# Patient Record
Sex: Male | Born: 1955
Health system: Southern US, Community
[De-identification: ages and names within clinical notes are randomized; demographics above are authoritative.]

## PROBLEM LIST (undated history)

## (undated) ENCOUNTER — Ambulatory Visit (HOSPITAL_COMMUNITY): Admission: EM | Payer: Medicare PPO | Source: Home / Self Care

## (undated) DIAGNOSIS — L0291 Cutaneous abscess, unspecified: Secondary | ICD-10-CM

## (undated) DIAGNOSIS — E119 Type 2 diabetes mellitus without complications: Secondary | ICD-10-CM

## (undated) DIAGNOSIS — I10 Essential (primary) hypertension: Secondary | ICD-10-CM

## (undated) DIAGNOSIS — Z973 Presence of spectacles and contact lenses: Secondary | ICD-10-CM

## (undated) DIAGNOSIS — M1990 Unspecified osteoarthritis, unspecified site: Secondary | ICD-10-CM

## (undated) HISTORY — PX: JOINT REPLACEMENT: SHX530

## (undated) HISTORY — PX: CYST REMOVAL NECK: SHX6281

## (undated) HISTORY — PX: COLONOSCOPY: SHX174

## (undated) HISTORY — PX: REPLACEMENT TOTAL KNEE: SUR1224

## (undated) HISTORY — PX: INGUINAL HERNIA REPAIR: SUR1180

## (undated) HISTORY — PX: PILONIDAL CYST EXCISION: SHX744

---

## 2001-02-07 ENCOUNTER — Emergency Department (HOSPITAL_COMMUNITY): Admission: EM | Admit: 2001-02-07 | Discharge: 2001-02-07 | Payer: Self-pay | Admitting: Emergency Medicine

## 2001-02-07 ENCOUNTER — Encounter: Payer: Self-pay | Admitting: Emergency Medicine

## 2001-02-13 ENCOUNTER — Ambulatory Visit (HOSPITAL_COMMUNITY): Admission: RE | Admit: 2001-02-13 | Discharge: 2001-02-13 | Payer: Self-pay | Admitting: Sports Medicine

## 2001-02-13 ENCOUNTER — Encounter: Payer: Self-pay | Admitting: Sports Medicine

## 2013-12-13 ENCOUNTER — Emergency Department (HOSPITAL_COMMUNITY)
Admission: EM | Admit: 2013-12-13 | Discharge: 2013-12-13 | Disposition: A | Discharge status: Home / Self Care | Payer: BC Managed Care – PPO | Attending: Emergency Medicine | Admitting: Emergency Medicine

## 2013-12-13 ENCOUNTER — Emergency Department (HOSPITAL_COMMUNITY): Payer: BC Managed Care – PPO

## 2013-12-13 ENCOUNTER — Encounter (HOSPITAL_COMMUNITY): Payer: Self-pay | Admitting: Emergency Medicine

## 2013-12-13 DIAGNOSIS — Y9389 Activity, other specified: Secondary | ICD-10-CM | POA: Insufficient documentation

## 2013-12-13 DIAGNOSIS — IMO0002 Reserved for concepts with insufficient information to code with codable children: Secondary | ICD-10-CM | POA: Insufficient documentation

## 2013-12-13 DIAGNOSIS — E119 Type 2 diabetes mellitus without complications: Secondary | ICD-10-CM | POA: Insufficient documentation

## 2013-12-13 DIAGNOSIS — S20219A Contusion of unspecified front wall of thorax, initial encounter: Secondary | ICD-10-CM | POA: Insufficient documentation

## 2013-12-13 DIAGNOSIS — R110 Nausea: Secondary | ICD-10-CM | POA: Insufficient documentation

## 2013-12-13 DIAGNOSIS — Z79899 Other long term (current) drug therapy: Secondary | ICD-10-CM | POA: Insufficient documentation

## 2013-12-13 DIAGNOSIS — M129 Arthropathy, unspecified: Secondary | ICD-10-CM | POA: Insufficient documentation

## 2013-12-13 DIAGNOSIS — S199XXA Unspecified injury of neck, initial encounter: Secondary | ICD-10-CM

## 2013-12-13 DIAGNOSIS — S0993XA Unspecified injury of face, initial encounter: Secondary | ICD-10-CM | POA: Insufficient documentation

## 2013-12-13 DIAGNOSIS — Y92410 Unspecified street and highway as the place of occurrence of the external cause: Secondary | ICD-10-CM | POA: Insufficient documentation

## 2013-12-13 HISTORY — DX: Type 2 diabetes mellitus without complications: E11.9

## 2013-12-13 HISTORY — DX: Unspecified osteoarthritis, unspecified site: M19.90

## 2013-12-13 LAB — POCT I-STAT, CHEM 8
BUN: 14 mg/dL (ref 6–23)
CREATININE: 0.7 mg/dL (ref 0.50–1.35)
Calcium, Ion: 1.2 mmol/L (ref 1.12–1.23)
Chloride: 99 mEq/L (ref 96–112)
GLUCOSE: 144 mg/dL — AB (ref 70–99)
HCT: 45 % (ref 39.0–52.0)
HEMOGLOBIN: 15.3 g/dL (ref 13.0–17.0)
Potassium: 3.9 mEq/L (ref 3.7–5.3)
Sodium: 138 mEq/L (ref 137–147)
TCO2: 26 mmol/L (ref 0–100)

## 2013-12-13 LAB — GLUCOSE, CAPILLARY: GLUCOSE-CAPILLARY: 132 mg/dL — AB (ref 70–99)

## 2013-12-13 LAB — POCT I-STAT TROPONIN I: Troponin i, poc: 0 ng/mL (ref 0.00–0.08)

## 2013-12-13 MED ORDER — METHOCARBAMOL 750 MG PO TABS: ORAL_TABLET | ORAL | Status: DC

## 2013-12-13 MED ORDER — TRAMADOL HCL 50 MG PO TABS: ORAL_TABLET | ORAL | Status: DC

## 2013-12-13 MED ORDER — KETOROLAC TROMETHAMINE 60 MG/2ML IM SOLN
60.0000 mg | Freq: Once | INTRAMUSCULAR | Status: AC
  Administered 2013-12-13: 60 mg via INTRAMUSCULAR
  Filled 2013-12-13: qty 2

## 2013-12-13 MED ORDER — CYCLOBENZAPRINE HCL 10 MG PO TABS
10.0000 mg | ORAL_TABLET | Freq: Once | ORAL | Status: AC
  Administered 2013-12-13: 10 mg via ORAL
  Filled 2013-12-13: qty 1

## 2013-12-13 MED ORDER — NAPROXEN 500 MG PO TABS: 500.0000 mg | ORAL_TABLET | Freq: Two times a day (BID) | ORAL | Status: DC

## 2013-12-13 NOTE — ED Notes (Signed)
Marchelle Folksmanda R unsuccessful attempt to draw labs. Fleet ContrasRachel C at bedside attempting lab draw at present time.

## 2013-12-13 NOTE — ED Notes (Signed)
Pt talking to coworker at present time. During blood draw pt sleeping and "not responding to staff" per Fleet Contrasachel C. Pt a/o x4 at present time. Pt reports "tired working overtime; was at work at 3 am this morning." EDP Knapp aware.
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
## 2013-12-13 NOTE — Progress Notes (Signed)
[facility] CL provided pt with a list of primary care resources, [facility] information, and [facility] [employer]. Patient was sleeping, CL left paperwork with pt belongings.
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
## 2013-12-13 NOTE — ED Notes (Signed)
Attempt to draw blood unsuccessful by this RN. Mini lab staff verbalizes will attempt lab draw.

## 2014-02-23 ENCOUNTER — Emergency Department (HOSPITAL_COMMUNITY): Payer: BC Managed Care – PPO

## 2014-02-23 ENCOUNTER — Encounter (HOSPITAL_COMMUNITY): Payer: Self-pay | Admitting: Emergency Medicine

## 2014-02-23 ENCOUNTER — Emergency Department (HOSPITAL_COMMUNITY)
Admission: EM | Admit: 2014-02-23 | Discharge: 2014-02-23 | Disposition: A | Discharge status: Home / Self Care | Payer: BC Managed Care – PPO | Attending: Emergency Medicine | Admitting: Emergency Medicine

## 2014-02-23 DIAGNOSIS — Y92410 Unspecified street and highway as the place of occurrence of the external cause: Secondary | ICD-10-CM | POA: Insufficient documentation

## 2014-02-23 DIAGNOSIS — E119 Type 2 diabetes mellitus without complications: Secondary | ICD-10-CM | POA: Insufficient documentation

## 2014-02-23 DIAGNOSIS — S46909A Unspecified injury of unspecified muscle, fascia and tendon at shoulder and upper arm level, unspecified arm, initial encounter: Secondary | ICD-10-CM | POA: Insufficient documentation

## 2014-02-23 DIAGNOSIS — Z791 Long term (current) use of non-steroidal anti-inflammatories (NSAID): Secondary | ICD-10-CM | POA: Insufficient documentation

## 2014-02-23 DIAGNOSIS — M129 Arthropathy, unspecified: Secondary | ICD-10-CM | POA: Insufficient documentation

## 2014-02-23 DIAGNOSIS — Z79899 Other long term (current) drug therapy: Secondary | ICD-10-CM | POA: Insufficient documentation

## 2014-02-23 DIAGNOSIS — Y9389 Activity, other specified: Secondary | ICD-10-CM | POA: Insufficient documentation

## 2014-02-23 DIAGNOSIS — S4992XA Unspecified injury of left shoulder and upper arm, initial encounter: Secondary | ICD-10-CM

## 2014-02-23 DIAGNOSIS — S4980XA Other specified injuries of shoulder and upper arm, unspecified arm, initial encounter: Secondary | ICD-10-CM | POA: Insufficient documentation

## 2014-02-23 MED ORDER — HYDROCODONE-ACETAMINOPHEN 5-325 MG PO TABS: 1.0000 | ORAL_TABLET | ORAL | Status: DC | PRN
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
## 2014-02-23 NOTE — ED Provider Notes (Signed)
Medical screening examination/treatment/procedure(s) were performed by non-physician practitioner and as supervising physician I was immediately available for consultation/collaboration.   EKG Interpretation None        William Daeveon Zweber, MD 02/23/14 1514 
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
## 2014-12-16 ENCOUNTER — Emergency Department (HOSPITAL_COMMUNITY)
Admission: EM | Admit: 2014-12-16 | Discharge: 2014-12-16 | Disposition: A | Discharge status: Home / Self Care | Payer: BLUE CROSS/BLUE SHIELD | Attending: Emergency Medicine | Admitting: Emergency Medicine

## 2014-12-16 ENCOUNTER — Emergency Department (HOSPITAL_COMMUNITY): Payer: BLUE CROSS/BLUE SHIELD

## 2014-12-16 ENCOUNTER — Encounter (HOSPITAL_COMMUNITY): Payer: Self-pay | Admitting: Emergency Medicine

## 2014-12-16 DIAGNOSIS — Y9289 Other specified places as the place of occurrence of the external cause: Secondary | ICD-10-CM | POA: Diagnosis not present

## 2014-12-16 DIAGNOSIS — Y9301 Activity, walking, marching and hiking: Secondary | ICD-10-CM | POA: Insufficient documentation

## 2014-12-16 DIAGNOSIS — S76101A Unspecified injury of right quadriceps muscle, fascia and tendon, initial encounter: Secondary | ICD-10-CM | POA: Diagnosis not present

## 2014-12-16 DIAGNOSIS — Y998 Other external cause status: Secondary | ICD-10-CM | POA: Insufficient documentation

## 2014-12-16 DIAGNOSIS — M1990 Unspecified osteoarthritis, unspecified site: Secondary | ICD-10-CM | POA: Diagnosis not present

## 2014-12-16 DIAGNOSIS — Z791 Long term (current) use of non-steroidal anti-inflammatories (NSAID): Secondary | ICD-10-CM | POA: Diagnosis not present

## 2014-12-16 DIAGNOSIS — S8991XA Unspecified injury of right lower leg, initial encounter: Secondary | ICD-10-CM

## 2014-12-16 DIAGNOSIS — S76111A Strain of right quadriceps muscle, fascia and tendon, initial encounter: Secondary | ICD-10-CM

## 2014-12-16 DIAGNOSIS — W108XXA Fall (on) (from) other stairs and steps, initial encounter: Secondary | ICD-10-CM | POA: Insufficient documentation

## 2014-12-16 DIAGNOSIS — E119 Type 2 diabetes mellitus without complications: Secondary | ICD-10-CM | POA: Insufficient documentation

## 2014-12-16 MED ORDER — OXYCODONE-ACETAMINOPHEN 5-325 MG PO TABS
2.0000 | ORAL_TABLET | Freq: Once | ORAL | Status: AC
  Administered 2014-12-16: 2 via ORAL
  Filled 2014-12-16: qty 2

## 2014-12-16 MED ORDER — NAPROXEN 500 MG PO TABS: 500.0000 mg | ORAL_TABLET | Freq: Two times a day (BID) | ORAL | Status: DC

## 2014-12-16 MED ORDER — ONDANSETRON HCL 4 MG/2ML IJ SOLN: 4.0000 mg | Freq: Once | INTRAMUSCULAR | Status: DC

## 2014-12-16 MED ORDER — ONDANSETRON 4 MG PO TBDP
4.0000 mg | ORAL_TABLET | Freq: Once | ORAL | Status: AC
  Administered 2014-12-16: 4 mg via ORAL
  Filled 2014-12-16: qty 1

## 2014-12-16 MED ORDER — OXYCODONE-ACETAMINOPHEN 5-325 MG PO TABS: 1.0000 | ORAL_TABLET | ORAL | Status: DC | PRN

## 2014-12-16 NOTE — ED Notes (Signed)
Patient transported to MRI 

## 2014-12-16 NOTE — Discharge Instructions (Signed)
Tendon Repair You have an injured tendon. This is a cord like structure that connects muscles to bones. Muscles and tendons work together to move your arms, legs, fingers, and toes. Your doctor may repair your tendon by sewing it back together. Following this repair the tendon needs to be immobilized (made to not move) and not used to allow it to heal. Sometimes the condition of the wound and the type of damage done may make it necessary to simply clean and repair the wound and then go back in and repair the tendon in about one week to ten days to obtain a better result. Sometimes x-rays are required to make sure there is no additional boney injury. With complete rupture (break) of a tendon, surgical repair with casting is necessary. Surgery allows the surgeon to put the tendon back together. The cast is used to allow the repair time to heal. The injury may be put in a caste or immobilized for 6 to 10 weeks. Immobilization means that the tendon injured is kept in position with a cast or splint. Once your caregiver feels you have healed well enough following this injury, they will provide exercises you can do to rehabilitate (make better) the injured tendon. HOME CARE INSTRUCTIONS   Do not use the injured tendon for as long as directed by your caregiver.  Rest the tendon as directed. Do not use it for lifting, walking, etc.  Leave the splint or dressing in place for as long as directed by your caregiver. Return for your first dressing change as directed.  Keep the dressing clean and dry.  Keep the injured area raised above the level of your heart as much as possible for the first week. SEEK MEDICAL CARE IF:   You have increased bleeding (more than a small spot) from the wound or from beneath your cast or splint.  You have redness, swelling, or increasing pain in the wound or from beneath your cast or splint.  You have pus coming from the wound or from beneath the cast or splint.  You notice a  bad smell coming from the wound or dressing or from beneath your cast or splint.  You develop increasing pain and swelling, not controlled with medicine. SEEK IMMEDIATE MEDICAL CARE IF:   You have a fever.  You develop a rash.  You have difficulty breathing.  You have any allergic problems. Document Released: 04/19/2001 Document Revised: 01/16/2012 Document Reviewed: 09/10/2007 [facility] Patient Information 2015 [employer] [place]. This information is not intended to replace advice given to you by your health care provider. Make sure you discuss any questions you have with your health care provider. Quadriceps Tendon Tear/Disruption with Rehab The quadriceps muscles are located on the front of the thigh and are responsible for straightening the knee and bending the hip. The quadriceps tendon connects these muscles to the kneecap (patella) and also from the patella to a portion of the shin bone (tibial tubercle). A quadriceps tendon tear or disruption is characterized by a partial or complete tear of the quadriceps tendon between the quadriceps muscles and the patella. Quadriceps tendon tears or disruptions often cause pain above the knee and result in a decrease in function of the quadriceps muscles.  SYMPTOMS   A "pop" or tear felt or heard above the patella at the time of injury.  Pain, tenderness, inflammation, and/or bruising over the quadriceps tendon.  Pain that worsens with use of the quadriceps muscles.  Difficulty with common tasks that involve the quadriceps  muscle, such as walking.  A crackling sound (crepitation) when the tendon is moved or touched.  Loss of fullness of the muscle or bulging within the area of muscle with complete rupture. CAUSES  A strain occurs when a force is placed on the muscle or tendon that is greater than it can withstand. Common mechanisms of injury include:  Repetitive strenuous use of the quadriceps muscles. This may be due to an increase in the  intensity, frequency, or duration of exercise.  Direct trauma to the quadriceps muscles or tendons. RISK INCREASES WITH:  Activities that involve forceful contractions of the quadriceps muscles (jumping or sprinting).  Contact sports (soccer or football).  Poor strength and flexibility.  Failure to warm-up properly before activity.  Previous injury to the thigh or knee.  Untreated quadriceps tendinitis.  Corticosteroid injections into the quadriceps tendon. PREVENTION  Warm up and stretch properly before activity.  Allow for adequate recovery between workouts.  Maintain physical fitness:  Strength, flexibility, and endurance.  Cardiovascular fitness.  Wear properly fitted and padded protective equipment. PROGNOSIS  If treated properly, then recovery from a quadriceps tendon tear or disruption usually occurs; however the recovery period may b 6 to 9 months.  RELATED COMPLICATIONS   Quadriceps muscle weakness.  Re-rupture of the tendon after treatment.  Prolonged healing time, if improperly treated or re-injured.  Risks of surgery: infection, bleeding, nerve damage, or damage to surrounding tissues. TREATMENT  Initial treatment involves rest from any activities that aggravate the symptoms. Ice, medication, and elevation may be used to help reduce pain and inflammation. The use of strengthening and stretching exercises may help reduce pain with activity. These exercises may be performed at home or with referral to a therapist. If the tear is complete, then surgery is usually required to repair the tendon, as it cannot heal on its own. After surgery immobilization is required to allow for healing. After immobilization it is important to perform strengthening and stretching exercises to help regain strength and a full range of motion.  MEDICATION   If pain medication is necessary, then nonsteroidal anti-inflammatory medications, such as aspirin and ibuprofen, or other minor  pain relievers, such as acetaminophen, are often recommended.  Do not take pain medication for 7 days before surgery.  Prescription pain relievers may be given if deemed necessary by your caregiver. Use only as directed and only as much as you need. COLD THERAPY  Cold treatment (icing) relieves pain and reduces inflammation. Cold treatment should be applied for 10 to 15 minutes every 2 to 3 hours for inflammation and pain and immediately after any activity that aggravates your symptoms. Use ice packs or massage the area with a piece of ice (ice massage). SEEK MEDICAL CARE IF:  Treatment seems to offer no benefit, or the condition worsens.  Any medications produce adverse side effects.  Any complications from surgery occur:  Pain, numbness, or coldness in the extremity operated upon.  Discoloration of the nail beds (they become blue or gray) of the extremity operated upon.  Signs of infections (fever, pain, inflammation, redness, or persistent bleeding). EXERCISES RANGE OF MOTION (ROM) AND STRETCHING EXERCISES - Quadriceps Tendon Tear/Disruption These exercises may help you when beginning to rehabilitate your injury. Your symptoms may resolve with or without further involvement from your physician, physical therapist or athletic trainer. While completing these exercises, remember:   Restoring tissue flexibility helps normal motion to return to the joints. This allows healthier, less painful movement and activity.  An effective  stretch should be held for at least 30 seconds.  A stretch should never be painful. You should only feel a gentle lengthening or release in the stretched tissue. RANGE OF MOTION - Knee Flexion and Extension, Active-Assisted  Sit on the edge of a table or chair with your thighs firmly supported. It may be helpful to place a folded towel under the end of your right / left thigh.  Flexion (bending): Place the ankle of your healthy leg on top of the other ankle. Use  your healthy leg to gently bend your right / left knee until you feel a mild tension across the top of your knee.  Hold for __________ seconds.  Extension (straightening): Switch your ankles so your right / left leg is on top. Use your healthy leg to straighten your right / left knee until you feel a mild tension on the backside of your knee.  Hold for __________ seconds. Repeat __________ times. Complete __________ times per day. RANGE OF MOTION - Knee Flexion, Active  Lie on your back with both knees straight. (If this causes back discomfort, bend your opposite knee, placing your foot flat on the floor.)  Slowly slide your heel back toward your buttocks until you feel a gentle stretch in the front of your knee or thigh.  Hold for __________ seconds. Slowly slide your heel back to the starting position. Repeat __________ times. Complete this exercise __________ times per day.  STRETCH - Knee Flexion, Supine  Lie on the floor with your right / left heel/foot lightly touching the wall (place both feet on the wall if you do not use a door frame).  Without using any effort, allow gravity to slide your foot down the wall slowly until you feel a gentle stretch in the front of your right / left knee.  Hold this stretch for __________ seconds. Then return the leg to the starting position, using your health leg for help, if needed. Repeat __________ times. Complete this stretch __________ times per day.  STRETCH - Quadriceps, Prone   Lie on your stomach on a firm surface, such as a bed or padded floor.  Bend your right / left knee and grasp your ankle. If you are unable to reach, your ankle or pant leg, use a belt around your foot to lengthen your reach.  Gently pull your heel toward your buttocks. Your knee should not slide out to the side. You should feel a stretch in the front of your thigh and/or knee. Hold this position for __________ seconds.  Repeat __________ times. Complete this  stretch __________ times per day.  STRENGTHENING EXERCISES - Quadriceps Tendon Tear/Disruption These exercises may help you when beginning to rehabilitate your injury. They may resolve your symptoms with or without further involvement from your physician, physical therapist or athletic trainer. While completing these exercises, remember:   Muscles can gain both the endurance and the strength needed for everyday activities through controlled exercises.  Complete these exercises as instructed by your physician, physical therapist or athletic trainer. Progress the resistance and repetitions only as guided. STRENGTH - Quadriceps, Isometrics  Lie on your back with your right / left leg extended and your opposite knee bent.  Gradually tense the muscles in the front of your right / left thigh. You should see either your knee cap slide up toward your hip or increased dimpling just above the knee. This motion will push the back of the knee down toward the floor/mat/bed on which you are lying.  Hold the muscle as tight as you can without increasing your pain for __________ seconds.  Relax the muscles slowly and completely in between each repetition. Repeat __________ times. Complete this exercise __________ times per day.  STRENGTH - Quadriceps, Short Arcs   Lie on your back. Place a __________ inch towel roll under your knee so that the knee slightly bends.  Raise only your lower leg by tightening the muscles in the front of your thigh. Do not allow your thigh to rise.  Hold this position for __________ seconds. Repeat __________ times. Complete this exercise __________ times per day.  OPTIONAL ANKLE WEIGHTS: Begin with ____________________, but DO NOT exceed ____________________. Increase in1 lb/0.5 kg increments.  STRENGTH - Quadriceps, Straight Leg Raises  Quality counts! Watch for signs that the quadriceps muscle is working to insure you are strengthening the correct muscles and not "cheating"  by substituting with healthier muscles.  Lay on your back with your right / left leg extended and your opposite knee bent.  Tense the muscles in the front of your right / left thigh. You should see either your knee cap slide up or increased dimpling just above the knee. Your thigh may even quiver.  Tighten these muscles even more and raise your leg 4 to 6 inches off the floor. Hold for __________ seconds.  Keeping these muscles tense, lower your leg.  Relax the muscles slowly and completely in between each repetition. Repeat __________ times. Complete this exercise __________ times per day.  STRENGTH - Quadriceps, Step-Ups   Use a thick book, step or step stool that is __________ inches tall.  Holding a wall or counter for balance only, not support.  Slowly step-up with your right / left foot, keeping your knee in line with your hip and foot. Do not allow your knee to bend so far that you cannot see your toes.  Slowly unlock your knee and lower yourself to the starting position. Your muscles, not gravity, should lower you. Repeat __________ times. Complete this exercise __________ times per day.  STRENGTH - Quadriceps, Wall Slides  Follow guidelines for form closely. Increased knee pain often results from poorly placed feet or knees.  Lean against a smooth wall or door and walk your feet out 18-24 inches. Place your feet hip-width apart.  Slowly slide down the wall or door until your knees bend __________ degrees.* Keep your knees over your heels, not your toes, and in line with your hips, not falling to either side.  Hold for __________ seconds. Stand up to rest for __________ seconds in between each repetition. Repeat __________ times. Complete this exercise __________ times per day. * Your physician, physical therapist or athletic trainer will alter this angle based on your symptoms and progress. Document Released: 10/24/2005 Document Revised: 01/16/2012 Document Reviewed:  02/05/2009 [facility] Patient Information 2015 [place], [place]. This information is not intended to replace advice given to you by your health care provider. Make sure you discuss any questions you have with your health care provider.

## 2014-12-16 NOTE — ED Provider Notes (Signed)
CSN: [id number]     Arrival date & time 12/16/14  1958 History   First MD Initiated Contact with Patient 12/16/14 2011     Chief Complaint  Patient presents with  . Knee Pain     (Consider location/radiation/quality/duration/timing/severity/associated sxs/prior Treatment) HPI  Social 59 year old male who presents emergency Department with chief complaint of right knee injury. Patient states he was walking up a set of stairs when he missed the top step and had a hyper flexion injury of the right knee. Patient fell on the knee and was unable to move the leg after that, stand, or ambulate. He has moderate pain at this time. He denies any previous injuries to the knee.  Past Medical History  Diagnosis Date  . Diabetes mellitus without complication   . Arthritis    History reviewed. No pertinent past surgical history. History reviewed. No pertinent family history. History  Substance Use Topics  . Smoking status: Never Smoker   . Smokeless tobacco: Not on file  . Alcohol Use: 6.0 oz/week    10 Cans of beer per week    Review of Systems  Ten systems reviewed and are negative for acute change, except as noted in the HPI.    Allergies  Review of patient's allergies indicates no known allergies.  Home Medications   Prior to Admission medications   Medication Sig Start Date End Date Taking? Authorizing Provider  acetaminophen (TYLENOL) 325 MG tablet Take 650 mg by mouth every 6 (six) hours as needed for headache (headache).   Yes Historical Provider, MD  lisinopril (PRINIVIL,ZESTRIL) 10 MG tablet Take 10 mg by mouth daily.    Yes Historical Provider, MD  metFORMIN (GLUCOPHAGE) 1000 MG tablet Take 1,000 mg by mouth 2 (two) times daily with a meal.   Yes Historical Provider, MD  HYDROcodone-acetaminophen (NORCO/VICODIN) 5-325 MG per tablet Take 1-2 tablets by mouth every 4 (four) hours as needed. 02/23/14   Kaitlyn Szekalski, PA-C  naproxen (NAPROSYN) 500 MG tablet Take 1 tablet (500 mg  total) by mouth 2 (two) times daily with a meal. 12/16/14   Arthor Captain, PA-C  oxyCODONE-acetaminophen (PERCOCET) 5-325 MG per tablet Take 1-2 tablets by mouth every 4 (four) hours as needed. 12/16/14   Tyeasha Ebbs, PA-C   BP 100/64 mmHg  Pulse 134  Temp(Src) 97.8 F (36.6 C) (Oral)  Resp 18  SpO2 98% Physical Exam  Constitutional: He is oriented to person, place, and time. He appears well-developed and well-nourished. No distress.  HENT:  Head: Normocephalic and atraumatic.  Eyes: Conjunctivae are normal. No scleral icterus.  Neck: Normal range of motion. Neck supple.  Cardiovascular: Normal rate, regular rhythm and normal heart sounds.   Pulmonary/Chest: Effort normal and breath sounds normal. No respiratory distress.  Abdominal: Soft. There is no tenderness.  Musculoskeletal: He exhibits no edema.  Right knee examination is performed. The right quadriceps is flaccid. There is a large spelled this superior to the patella. There is no tension noted in the patellar ligament. Patient has no tenderness to palpation over the patella or joint lines. He is unable to extend the knee joint.  Neurological: He is alert and oriented to person, place, and time.  Skin: Skin is warm and dry. He is not diaphoretic.  Psychiatric: His behavior is normal.  Nursing note and vitals reviewed.   ED Course  Procedures (including critical care time) Labs Review Labs Reviewed - No data to display  Imaging Review Mr Knee Right Wo Contrast  12/17/2014  CLINICAL DATA:  Status post fall 12/16/2014 with twisting injury right knee. Anterolateral right knee pain. Initial encounter.  EXAM: MRI OF THE RIGHT KNEE WITHOUT CONTRAST  TECHNIQUE: Multiplanar, multisequence MR imaging of the knee was performed. No intravenous contrast was administered.  COMPARISON:  Plain films right knee this same date.  FINDINGS: MENISCI  Medial meniscus: Mild degenerative signal is seen in the body of the medial meniscus but no tear  is identified. A cyst off the anterior horn of the medial meniscus measures 1 cm AP by 0.6 cm craniocaudal by 1.3 cm transverse.  Lateral meniscus:  Intact.  LIGAMENTS  Cruciates:  Intact.  Collaterals:  Intact.  CARTILAGE  Patellofemoral:  Minimally degenerated.  Medial:  Minimally degenerated.  Lateral:  Unremarkable.  Joint:  Small joint effusion is noted.  Popliteal Fossa:  No Baker's cyst.  Extensor Mechanism: There is near complete rupture of the quadriceps tendon just superior to its attachment to the patella. A thin band of the tendon appears flipped posteriorly and overlies the superior aspect of the patellar cartilage. The patellar tendon and retinacula are intact.  Bones:  Normal marrow signal throughout.  IMPRESSION: Near complete tear of the quadriceps tendon with little to no retraction. A thin band of the tendon attaching to the superior pole the patella is flipped posteriorly over hyaline cartilage of the patella.  Negative for meniscal or ligament tear.  Small ganglion or parameniscal cyst anterior horn medial meniscus. No associated meniscal tear is visualized.   Electronically Signed   By: Drusilla Kannerhomas  Dalessio M.D.   On: 12/17/2014 07:53   Dg Knee Complete 4 Views Right  12/16/2014   CLINICAL DATA:  Status post fall with twisting injury to right knee. Anterolateral knee pain at the patella. Initial encounter.  EXAM: RIGHT KNEE - COMPLETE 4+ VIEW  COMPARISON:  None.  FINDINGS: There is no evidence of fracture or dislocation. The joint spaces are preserved. The patellofemoral joint is grossly unremarkable in appearance. An enthesophyte is noted arising at the upper pole of the patella.  Trace joint fluid is at the upper limits of normal. Scattered soft tissue calcifications are seen.  IMPRESSION: No evidence of fracture or dislocation.   Electronically Signed   By: Roanna RaiderJeffery  Chang M.D.   On: 12/16/2014 21:10     EKG Interpretation None      MDM   Final diagnoses:  Quadriceps tendon rupture,  right, initial encounter  Acute medial meniscal tear, right, initial encounter    11:33 AM BP 100/64 mmHg  Pulse 134  Temp(Src) 97.8 F (36.6 C) (Oral)  Resp 18  SpO2 98% Patient here with knee injury. I have concern for quadriceps tendon rupture. Patient is given pain medication. I have consult to Dr. Roda ShuttersXU , who asks that the patient receive MRI while here in the emergency department. Patient will be placed in knee immobilizer.  Patient MRI reveals near complete meniscal tear. Placed in  Knee immobilizer, given crutches and pain meds. F/u with Dr. Orlie PollenXu  Jaysha Lasure, PA-C 12/17/14 1135  Linwood DibblesJon Knapp, MD 12/17/14 [phone]

## 2014-12-16 NOTE — ED Notes (Signed)
[facility] Expected date: 12/16/14 Expected time: 7:42 PM Means of arrival: Ambulance Comments: Fall, knee pain, immobilized

## 2014-12-16 NOTE — ED Notes (Signed)
Patient transported to X-ray 

## 2014-12-19 ENCOUNTER — Encounter (HOSPITAL_BASED_OUTPATIENT_CLINIC_OR_DEPARTMENT_OTHER): Payer: Self-pay | Admitting: *Deleted

## 2014-12-19 ENCOUNTER — Other Ambulatory Visit: Payer: Self-pay

## 2014-12-19 ENCOUNTER — Encounter (HOSPITAL_BASED_OUTPATIENT_CLINIC_OR_DEPARTMENT_OTHER)
Admission: RE | Admit: 2014-12-19 | Discharge: 2014-12-19 | Disposition: A | Discharge status: Home / Self Care | Payer: BLUE CROSS/BLUE SHIELD | Source: Ambulatory Visit | Attending: Orthopaedic Surgery | Admitting: Orthopaedic Surgery

## 2014-12-19 DIAGNOSIS — E119 Type 2 diabetes mellitus without complications: Secondary | ICD-10-CM | POA: Diagnosis not present

## 2014-12-19 DIAGNOSIS — X58XXXA Exposure to other specified factors, initial encounter: Secondary | ICD-10-CM | POA: Diagnosis not present

## 2014-12-19 DIAGNOSIS — S76111A Strain of right quadriceps muscle, fascia and tendon, initial encounter: Secondary | ICD-10-CM | POA: Diagnosis present

## 2014-12-19 DIAGNOSIS — Z01818 Encounter for other preprocedural examination: Secondary | ICD-10-CM | POA: Insufficient documentation

## 2014-12-19 DIAGNOSIS — I10 Essential (primary) hypertension: Secondary | ICD-10-CM | POA: Diagnosis not present

## 2014-12-19 LAB — BASIC METABOLIC PANEL
ANION GAP: 8 (ref 5–15)
BUN: 10 mg/dL (ref 6–23)
CALCIUM: 8.6 mg/dL (ref 8.4–10.5)
CO2: 25 mmol/L (ref 19–32)
CREATININE: 0.57 mg/dL (ref 0.50–1.35)
Chloride: 104 mmol/L (ref 96–112)
Glucose, Bld: 213 mg/dL — ABNORMAL HIGH (ref 70–99)
Potassium: 3.8 mmol/L (ref 3.5–5.1)
Sodium: 137 mmol/L (ref 135–145)

## 2014-12-19 NOTE — Progress Notes (Signed)
To come in for [id number] 

## 2014-12-24 ENCOUNTER — Ambulatory Visit (HOSPITAL_BASED_OUTPATIENT_CLINIC_OR_DEPARTMENT_OTHER)
Admission: RE | Admit: 2014-12-24 | Discharge: 2014-12-24 | Disposition: A | Discharge status: Home / Self Care | Payer: BLUE CROSS/BLUE SHIELD | Source: Ambulatory Visit | Attending: Orthopaedic Surgery | Admitting: Orthopaedic Surgery

## 2014-12-24 ENCOUNTER — Encounter (HOSPITAL_BASED_OUTPATIENT_CLINIC_OR_DEPARTMENT_OTHER)
Admission: RE | Disposition: A | Discharge status: Home / Self Care | Payer: Self-pay | Source: Ambulatory Visit | Attending: Orthopaedic Surgery

## 2014-12-24 ENCOUNTER — Encounter (HOSPITAL_BASED_OUTPATIENT_CLINIC_OR_DEPARTMENT_OTHER): Payer: Self-pay | Admitting: Certified Registered"

## 2014-12-24 ENCOUNTER — Ambulatory Visit (HOSPITAL_BASED_OUTPATIENT_CLINIC_OR_DEPARTMENT_OTHER): Payer: BLUE CROSS/BLUE SHIELD | Admitting: Certified Registered"

## 2014-12-24 DIAGNOSIS — I10 Essential (primary) hypertension: Secondary | ICD-10-CM | POA: Insufficient documentation

## 2014-12-24 DIAGNOSIS — X58XXXA Exposure to other specified factors, initial encounter: Secondary | ICD-10-CM | POA: Insufficient documentation

## 2014-12-24 DIAGNOSIS — S76111A Strain of right quadriceps muscle, fascia and tendon, initial encounter: Secondary | ICD-10-CM | POA: Insufficient documentation

## 2014-12-24 DIAGNOSIS — E119 Type 2 diabetes mellitus without complications: Secondary | ICD-10-CM | POA: Insufficient documentation

## 2014-12-24 DIAGNOSIS — S76119A Strain of unspecified quadriceps muscle, fascia and tendon, initial encounter: Secondary | ICD-10-CM | POA: Diagnosis present

## 2014-12-24 HISTORY — PX: QUADRICEPS TENDON REPAIR: SHX756

## 2014-12-24 HISTORY — DX: Presence of spectacles and contact lenses: Z97.3

## 2014-12-24 HISTORY — DX: Essential (primary) hypertension: I10

## 2014-12-24 LAB — GLUCOSE, CAPILLARY
GLUCOSE-CAPILLARY: 175 mg/dL — AB (ref 70–99)
Glucose-Capillary: 146 mg/dL — ABNORMAL HIGH (ref 70–99)
Glucose-Capillary: 148 mg/dL — ABNORMAL HIGH (ref 70–99)

## 2014-12-24 LAB — POCT HEMOGLOBIN-HEMACUE: HEMOGLOBIN: 13.6 g/dL (ref 13.0–17.0)

## 2014-12-24 SURGERY — REPAIR, TENDON, QUADRICEPS
Anesthesia: General | Site: Knee | Laterality: Right

## 2014-12-24 MED ORDER — SODIUM CHLORIDE 0.9 % IR SOLN
Status: DC | PRN
  Administered 2014-12-24: 1500 mL

## 2014-12-24 MED ORDER — FENTANYL CITRATE 0.05 MG/ML IJ SOLN
INTRAMUSCULAR | Status: DC | PRN
  Administered 2014-12-24: 50 ug via INTRAVENOUS
  Administered 2014-12-24 (×4): 25 ug via INTRAVENOUS

## 2014-12-24 MED ORDER — FENTANYL CITRATE 0.05 MG/ML IJ SOLN
INTRAMUSCULAR | Status: AC
  Filled 2014-12-24: qty 2

## 2014-12-24 MED ORDER — METOCLOPRAMIDE HCL 5 MG PO TABS: 5.0000 mg | ORAL_TABLET | Freq: Three times a day (TID) | ORAL | Status: DC | PRN

## 2014-12-24 MED ORDER — OXYCODONE HCL 5 MG PO TABS: 5.0000 mg | ORAL_TABLET | ORAL | Status: DC | PRN

## 2014-12-24 MED ORDER — HYDROMORPHONE HCL 1 MG/ML IJ SOLN
INTRAMUSCULAR | Status: AC
  Filled 2014-12-24: qty 1

## 2014-12-24 MED ORDER — METOCLOPRAMIDE HCL 5 MG/ML IJ SOLN: 5.0000 mg | Freq: Three times a day (TID) | INTRAMUSCULAR | Status: DC | PRN

## 2014-12-24 MED ORDER — PROMETHAZINE HCL 25 MG/ML IJ SOLN: 6.2500 mg | INTRAMUSCULAR | Status: DC | PRN

## 2014-12-24 MED ORDER — SENNA 8.6 MG PO TABS: 1.0000 | ORAL_TABLET | Freq: Two times a day (BID) | ORAL | Status: DC

## 2014-12-24 MED ORDER — ONDANSETRON HCL 4 MG PO TABS: 4.0000 mg | ORAL_TABLET | Freq: Four times a day (QID) | ORAL | Status: DC | PRN

## 2014-12-24 MED ORDER — MIDAZOLAM HCL 2 MG/2ML IJ SOLN
INTRAMUSCULAR | Status: AC
  Filled 2014-12-24: qty 2

## 2014-12-24 MED ORDER — METHOCARBAMOL 500 MG PO TABS: 500.0000 mg | ORAL_TABLET | Freq: Four times a day (QID) | ORAL | Status: DC | PRN

## 2014-12-24 MED ORDER — CEFAZOLIN SODIUM-DEXTROSE 2-3 GM-% IV SOLR: 2.0000 g | Freq: Four times a day (QID) | INTRAVENOUS | Status: DC

## 2014-12-24 MED ORDER — SODIUM CHLORIDE 0.9 % IV SOLN
INTRAVENOUS | Status: DC
  Administered 2014-12-24: 13:00:00 via INTRAVENOUS

## 2014-12-24 MED ORDER — LISINOPRIL 10 MG PO TABS: 10.0000 mg | ORAL_TABLET | Freq: Every day | ORAL | Status: DC

## 2014-12-24 MED ORDER — HYDROMORPHONE HCL 1 MG/ML IJ SOLN
0.2500 mg | INTRAMUSCULAR | Status: DC | PRN
  Administered 2014-12-24 (×4): 0.5 mg via INTRAVENOUS

## 2014-12-24 MED ORDER — INSULIN ASPART 100 UNIT/ML ~~LOC~~ SOLN
0.0000 [IU] | Freq: Three times a day (TID) | SUBCUTANEOUS | Status: DC
  Administered 2014-12-24: 3 [IU] via SUBCUTANEOUS
  Filled 2014-12-24: qty 1

## 2014-12-24 MED ORDER — EPHEDRINE SULFATE 50 MG/ML IJ SOLN
INTRAMUSCULAR | Status: DC | PRN
  Administered 2014-12-24: 10 mg via INTRAVENOUS

## 2014-12-24 MED ORDER — DIPHENHYDRAMINE HCL 12.5 MG/5ML PO ELIX: 25.0000 mg | ORAL_SOLUTION | ORAL | Status: DC | PRN

## 2014-12-24 MED ORDER — LACTATED RINGERS IV SOLN
INTRAVENOUS | Status: DC
  Administered 2014-12-24 (×2): via INTRAVENOUS

## 2014-12-24 MED ORDER — ONDANSETRON HCL 4 MG/2ML IJ SOLN: 4.0000 mg | Freq: Four times a day (QID) | INTRAMUSCULAR | Status: DC | PRN

## 2014-12-24 MED ORDER — ASPIRIN EC 325 MG PO TBEC: 325.0000 mg | DELAYED_RELEASE_TABLET | Freq: Two times a day (BID) | ORAL | Status: DC

## 2014-12-24 MED ORDER — CEFAZOLIN SODIUM-DEXTROSE 2-3 GM-% IV SOLR
INTRAVENOUS | Status: AC
  Filled 2014-12-24: qty 50

## 2014-12-24 MED ORDER — INSULIN ASPART 100 UNIT/ML ~~LOC~~ SOLN: 0.0000 [IU] | Freq: Every day | SUBCUTANEOUS | Status: DC

## 2014-12-24 MED ORDER — DEXTROSE 5 % IV SOLN: 500.0000 mg | Freq: Four times a day (QID) | INTRAVENOUS | Status: DC | PRN

## 2014-12-24 MED ORDER — MIDAZOLAM HCL 2 MG/2ML IJ SOLN
1.0000 mg | INTRAMUSCULAR | Status: DC | PRN
  Administered 2014-12-24: 1 mg via INTRAVENOUS

## 2014-12-24 MED ORDER — CEFAZOLIN SODIUM-DEXTROSE 2-3 GM-% IV SOLR
INTRAVENOUS | Status: DC | PRN
  Administered 2014-12-24: 2 g via INTRAVENOUS

## 2014-12-24 MED ORDER — SORBITOL 70 % SOLN: 30.0000 mL | Freq: Every day | Status: DC | PRN

## 2014-12-24 MED ORDER — FENTANYL CITRATE 0.05 MG/ML IJ SOLN
INTRAMUSCULAR | Status: AC
  Filled 2014-12-24: qty 6

## 2014-12-24 MED ORDER — ACETAMINOPHEN 325 MG PO TABS: 650.0000 mg | ORAL_TABLET | Freq: Four times a day (QID) | ORAL | Status: DC | PRN

## 2014-12-24 MED ORDER — HYDROCODONE-ACETAMINOPHEN 5-325 MG PO TABS
1.0000 | ORAL_TABLET | ORAL | Status: DC | PRN
  Administered 2014-12-24: 2 via ORAL
  Filled 2014-12-24: qty 2

## 2014-12-24 MED ORDER — BUPIVACAINE-EPINEPHRINE (PF) 0.5% -1:200000 IJ SOLN
INTRAMUSCULAR | Status: DC | PRN
  Administered 2014-12-24: 30 mL via PERINEURAL

## 2014-12-24 MED ORDER — LIDOCAINE HCL (CARDIAC) 20 MG/ML IV SOLN
INTRAVENOUS | Status: DC | PRN
  Administered 2014-12-24: 30 mg via INTRAVENOUS

## 2014-12-24 MED ORDER — POLYETHYLENE GLYCOL 3350 17 G PO PACK: 17.0000 g | PACK | Freq: Every day | ORAL | Status: DC | PRN

## 2014-12-24 MED ORDER — MIDAZOLAM HCL 5 MG/5ML IJ SOLN
INTRAMUSCULAR | Status: DC | PRN
  Administered 2014-12-24: 2 mg via INTRAVENOUS

## 2014-12-24 MED ORDER — ONDANSETRON HCL 4 MG/2ML IJ SOLN
INTRAMUSCULAR | Status: DC | PRN
  Administered 2014-12-24: 4 mg via INTRAVENOUS

## 2014-12-24 MED ORDER — MAGNESIUM CITRATE PO SOLN: 1.0000 | Freq: Once | ORAL | Status: DC | PRN

## 2014-12-24 MED ORDER — FENTANYL CITRATE 0.05 MG/ML IJ SOLN
50.0000 ug | INTRAMUSCULAR | Status: DC | PRN
  Administered 2014-12-24: 100 ug via INTRAVENOUS

## 2014-12-24 MED ORDER — DEXAMETHASONE SODIUM PHOSPHATE 10 MG/ML IJ SOLN
INTRAMUSCULAR | Status: DC | PRN
  Administered 2014-12-24: 4 mg via INTRAVENOUS

## 2014-12-24 MED ORDER — MORPHINE SULFATE 2 MG/ML IJ SOLN: 1.0000 mg | INTRAMUSCULAR | Status: DC | PRN

## 2014-12-24 MED ORDER — PROPOFOL 10 MG/ML IV BOLUS
INTRAVENOUS | Status: DC | PRN
  Administered 2014-12-24: 150 mg via INTRAVENOUS

## 2014-12-24 SURGICAL SUPPLY — 86 items
BANDAGE ELASTIC 4 VELCRO ST LF (GAUZE/BANDAGES/DRESSINGS) ×2 IMPLANT
BANDAGE ELASTIC 6 VELCRO ST LF (GAUZE/BANDAGES/DRESSINGS) ×3 IMPLANT
BANDAGE ESMARK 6X9 LF (GAUZE/BANDAGES/DRESSINGS) ×1 IMPLANT
BIT DRILL 3/32DIAX5INL DISPOSE (BIT) IMPLANT
BIT DRILL 3/32DX5IN DISP (BIT) ×1
BLADE HEX COATED 2.75 (ELECTRODE) ×3 IMPLANT
BLADE SURG 10 STRL SS (BLADE) ×2 IMPLANT
BLADE SURG 15 STRL LF DISP TIS (BLADE) ×2 IMPLANT
BLADE SURG 15 STRL SS (BLADE) ×6
BNDG CMPR 9X6 STRL LF SNTH (GAUZE/BANDAGES/DRESSINGS) ×1
BNDG ESMARK 6X9 LF (GAUZE/BANDAGES/DRESSINGS) ×3
CANISTER SUCT 1200ML W/VALVE (MISCELLANEOUS) ×1 IMPLANT
COVER BACK TABLE 60X90IN (DRAPES) ×3 IMPLANT
CUFF TOURNIQUET SINGLE 24IN (TOURNIQUET CUFF) IMPLANT
CUFF TOURNIQUET SINGLE 34IN LL (TOURNIQUET CUFF) ×2 IMPLANT
DECANTER SPIKE VIAL GLASS SM (MISCELLANEOUS) IMPLANT
DRAPE EXTREMITY T 121X128X90 (DRAPE) ×3 IMPLANT
DRAPE INCISE IOBAN 66X45 STRL (DRAPES) ×2 IMPLANT
DRAPE SURG 17X23 STRL (DRAPES) ×4 IMPLANT
DRAPE U 20/CS (DRAPES) ×1 IMPLANT
DRAPE U-SHAPE 47X51 STRL (DRAPES) IMPLANT
DRILL BIT 3/32DIAX5INL DISPOSE (BIT) ×3
DURAPREP 26ML APPLICATOR (WOUND CARE) ×7 IMPLANT
ELECT REM PT RETURN 9FT ADLT (ELECTROSURGICAL) ×3
ELECTRODE REM PT RTRN 9FT ADLT (ELECTROSURGICAL) ×1 IMPLANT
GAUZE SPONGE 4X4 12PLY STRL (GAUZE/BANDAGES/DRESSINGS) ×3 IMPLANT
GAUZE SPONGE 4X4 16PLY XRAY LF (GAUZE/BANDAGES/DRESSINGS) IMPLANT
GAUZE XEROFORM 1X8 LF (GAUZE/BANDAGES/DRESSINGS) ×3 IMPLANT
GLOVE BIO SURGEON STRL SZ8.5 (GLOVE) ×2 IMPLANT
GLOVE BIOGEL PI IND STRL 7.0 (GLOVE) IMPLANT
GLOVE BIOGEL PI INDICATOR 7.0 (GLOVE) ×2
GLOVE ECLIPSE 6.5 STRL STRAW (GLOVE) ×2 IMPLANT
GLOVE EXAM NITRILE LRG STRL (GLOVE) ×2 IMPLANT
GLOVE NEODERM STRL 7.5 LF PF (GLOVE) ×1 IMPLANT
GLOVE SURG NEODERM 7.5  LF PF (GLOVE) ×2
GLOVE SURG SYN 7.5  E (GLOVE) ×2
GLOVE SURG SYN 7.5 E (GLOVE) ×1 IMPLANT
GLOVE SURG SYN 7.5 PF PI (GLOVE) ×1 IMPLANT
GOWN STRL REIN XL XLG (GOWN DISPOSABLE) ×3 IMPLANT
GOWN STRL REUS W/ TWL LRG LVL3 (GOWN DISPOSABLE) ×1 IMPLANT
GOWN STRL REUS W/TWL LRG LVL3 (GOWN DISPOSABLE) ×3
HANDPIECE INTERPULSE COAX TIP (DISPOSABLE) ×3
IMMOBILIZER KNEE 22  40 CIR (ORTHOPEDIC SUPPLIES) ×2
IMMOBILIZER KNEE 22 40 CIR (ORTHOPEDIC SUPPLIES) IMPLANT
IV NS IRRIG 3000ML ARTHROMATIC (IV SOLUTION) ×2 IMPLANT
K-WIRE 2.0X150M (WIRE) ×3
KNEE WRAP E Z 3 GEL PACK (MISCELLANEOUS) ×2 IMPLANT
KWIRE 2.0X150M (WIRE) IMPLANT
LIQUID BAND (GAUZE/BANDAGES/DRESSINGS) ×2 IMPLANT
MANIFOLD NEPTUNE II (INSTRUMENTS) ×2 IMPLANT
NEEDLE HYPO 22GX1.5 SAFETY (NEEDLE) IMPLANT
NS IRRIG 1000ML POUR BTL (IV SOLUTION) ×1 IMPLANT
PACK ARTHROSCOPY DSU (CUSTOM PROCEDURE TRAY) ×2 IMPLANT
PACK BASIN DAY SURGERY FS (CUSTOM PROCEDURE TRAY) ×3 IMPLANT
PAD CAST 4YDX4 CTTN HI CHSV (CAST SUPPLIES) IMPLANT
PADDING CAST COTTON 4X4 STRL (CAST SUPPLIES)
PADDING CAST COTTON 6X4 STRL (CAST SUPPLIES) ×2 IMPLANT
PADDING CAST SYN 6 (CAST SUPPLIES)
PADDING CAST SYNTHETIC 4 (CAST SUPPLIES)
PADDING CAST SYNTHETIC 4X4 STR (CAST SUPPLIES) IMPLANT
PADDING CAST SYNTHETIC 6X4 NS (CAST SUPPLIES) IMPLANT
PENCIL BUTTON HOLSTER BLD 10FT (ELECTRODE) ×3 IMPLANT
RETRIEVER SUT HEWSON (MISCELLANEOUS) ×2 IMPLANT
SET HNDPC FAN SPRY TIP SCT (DISPOSABLE) IMPLANT
SLEEVE SCD COMPRESS KNEE MED (MISCELLANEOUS) ×2 IMPLANT
SPONGE LAP 18X18 X RAY DECT (DISPOSABLE) ×2 IMPLANT
SPONGE LAP 4X18 X RAY DECT (DISPOSABLE) IMPLANT
STAPLER VISISTAT (STAPLE) IMPLANT
SUCTION FRAZIER TIP 10 FR DISP (SUCTIONS) ×3 IMPLANT
SUT ETHIBOND 2 OS 4 DA (SUTURE) ×4 IMPLANT
SUT ETHILON 2 0 FS 18 (SUTURE) ×6 IMPLANT
SUT ETHILON 3 0 PS 1 (SUTURE) IMPLANT
SUT FIBERWIRE #5 38 CONV NDL (SUTURE) ×6
SUT VIC AB 0 CT1 27 (SUTURE) ×3
SUT VIC AB 0 CT1 27XBRD ANBCTR (SUTURE) IMPLANT
SUT VIC AB 2-0 CT1 27 (SUTURE) ×6
SUT VIC AB 2-0 CT1 TAPERPNT 27 (SUTURE) IMPLANT
SUTURE FIBERWR #5 38 CONV NDL (SUTURE) IMPLANT
SYR BULB 3OZ (MISCELLANEOUS) ×3 IMPLANT
SYR CONTROL 10ML LL (SYRINGE) IMPLANT
TOWEL OR 17X24 6PK STRL BLUE (TOWEL DISPOSABLE) ×3 IMPLANT
TOWEL OR NON WOVEN STRL DISP B (DISPOSABLE) ×2 IMPLANT
TUBE CONNECTING 20'X1/4 (TUBING) ×1
TUBE CONNECTING 20X1/4 (TUBING) ×2 IMPLANT
UNDERPAD 30X30 INCONTINENT (UNDERPADS AND DIAPERS) ×1 IMPLANT
YANKAUER SUCT BULB TIP NO VENT (SUCTIONS) ×2 IMPLANT

## 2014-12-24 NOTE — Anesthesia Postprocedure Evaluation (Signed)
  Anesthesia Post-op Note  Patient: Jeffrey Murphy  Procedure(s) Performed: Procedure(s): RIGHT QUADRICEP TENDON REPAIR  (Right)  Patient Location: PACU  Anesthesia Type:GA combined with regional for post-op pain  Level of Consciousness: awake and alert   Airway and Oxygen Therapy: Patient Spontanous Breathing  Post-op Pain: none  Post-op Assessment: Post-op Vital signs reviewed  Post-op Vital Signs: stable  Last Vitals:  Filed Vitals:   12/24/14 1245  BP: 149/92  Pulse: 107  Temp: 36.4 C  Resp: 18    Complications: No apparent anesthesia complications

## 2014-12-24 NOTE — H&P (Signed)
PREOPERATIVE H&P  Chief Complaint: Right quad tendon rupture  HPI: Jeffrey Murphy is a 59 y.o. male who presents for surgical treatment of Right quad tendon rupture.  He denies any changes in medical history.  Past Medical History  Diagnosis Date  . Diabetes mellitus without complication   . Arthritis   . Hypertension   . Wears glasses     reading   Past Surgical History  Procedure Laterality Date  . Inguinal hernia repair      right  . Pilonidal cyst excision      back  . Cyst removal neck    . Colonoscopy     History   Social History  . Marital Status: Married    Spouse Name: N/A  . Number of Children: N/A  . Years of Education: N/A   Social History Main Topics  . Smoking status: Never Smoker   . Smokeless tobacco: Not on file  . Alcohol Use: 6.0 oz/week    10 Cans of beer per week  . Drug Use: No  . Sexual Activity: Not on file   Other Topics Concern  . None   Social History Narrative   History reviewed. No pertinent family history. No Known Allergies Prior to Admission medications   Medication Sig Start Date End Date Taking? Authorizing Provider  acetaminophen (TYLENOL) 325 MG tablet Take 650 mg by mouth every 6 (six) hours as needed for headache (headache).    Historical Provider, MD  HYDROcodone-acetaminophen (NORCO/VICODIN) 5-325 MG per tablet Take 1-2 tablets by mouth every 4 (four) hours as needed. 02/23/14   Kaitlyn Szekalski, PA-C  lisinopril (PRINIVIL,ZESTRIL) 10 MG tablet Take 10 mg by mouth daily.     Historical Provider, MD  metFORMIN (GLUCOPHAGE) 1000 MG tablet Take 1,000 mg by mouth 2 (two) times daily with a meal.    Historical Provider, MD  naproxen (NAPROSYN) 500 MG tablet Take 1 tablet (500 mg total) by mouth 2 (two) times daily with a meal. 12/16/14   Arthor CaptainAbigail Harris, PA-C  oxyCODONE-acetaminophen (PERCOCET) 5-325 MG per tablet Take 1-2 tablets by mouth every 4 (four) hours as needed. 12/16/14   Arthor CaptainAbigail Harris, PA-C     Positive ROS: All other  systems have been reviewed and were otherwise negative with the exception of those mentioned in the HPI and as above.  Physical Exam: General: Alert, no acute distress Cardiovascular: No pedal edema Respiratory: No cyanosis, no use of accessory musculature GI: abdomen soft Skin: No lesions in the area of chief complaint Neurologic: Sensation intact distally Psychiatric: Patient is competent for consent with normal mood and affect Lymphatic: no lymphedema  MUSCULOSKELETAL: exam stable  Assessment: Right quad tendon rupture  Plan: Plan for Procedure(s): RIGHT QUADRICEP TENDON REPAIR   The risks benefits and alternatives were discussed with the patient including but not limited to the risks of nonoperative treatment, versus surgical intervention including infection, bleeding, nerve injury,  blood clots, cardiopulmonary complications, morbidity, mortality, among others, and they were willing to proceed.   Cheral AlmasXu, Temari Schooler Michael, MD   12/24/2014 7:37 AM

## 2014-12-24 NOTE — Anesthesia Procedure Notes (Addendum)
Anesthesia Regional Block:  Femoral nerve block  Pre-Anesthetic Checklist: ,, timeout performed, Correct Patient, Correct Site, Correct Laterality, Correct Procedure, Correct Position, site marked, Risks and benefits discussed,  Surgical consent,  Pre-op evaluation,  At surgeon's request and post-op pain management  Laterality: Right and Upper  Prep: chloraprep       Needles:  Injection technique: Single-shot  Needle Type: Stimulator Needle - 80     Needle Length: 9cm 9 cm Needle Gauge: 22 and 22 G  Needle insertion depth: 6 cm   Additional Needles:  Procedures: ultrasound guided (picture in chart) and nerve stimulator Femoral nerve block  Nerve Stimulator or Paresthesia:  Response: Twitch elicited, 0.8 mA,   Additional Responses:   Narrative:  Start time: 12/24/2014 9:10 AM End time: 12/24/2014 9:25 AM Injection made incrementally with aspirations every 5 mL.  Performed by: Personally  Anesthesiologist: MASSAGEE, TERRY  Additional Notes: BP cuff, EKG monitors applied. Sedation begun. Femoral artery palpated for location of nerve. After nerve location anesthetic injected incrementally, slowly , and after neg aspirations. Tolerated well.   Procedure Name: LMA Insertion Date/Time: 12/24/2014 9:41 AM Performed by: Tanganika Barradas Pre-anesthesia Checklist: Patient identified, Emergency Drugs available, Suction available and Patient being monitored Patient Re-evaluated:Patient Re-evaluated prior to inductionOxygen Delivery Method: Circle System Utilized Preoxygenation: Pre-oxygenation with 100% oxygen Intubation Type: IV induction Ventilation: Mask ventilation without difficulty LMA: LMA inserted LMA Size: 4.0 Number of attempts: 1 Airway Equipment and Method: Bite block Placement Confirmation: positive ETCO2 Tube secured with: Tape Dental Injury: Teeth and Oropharynx as per pre-operative assessment

## 2014-12-24 NOTE — Progress Notes (Signed)
Orthopedic Tech Progress Note Patient Details:  Jeffrey Murphy 11/05/1956 [id number] Called in brace order to [facility]. Patient ID: Jeffrey JasperJoe A Murphy, male   DOB: 01/19/1956, 59 y.o.   MRN: [id number]   Lesle ChrisGilliland, Lariza Cothron L 12/24/2014, 1:37 PM

## 2014-12-24 NOTE — Discharge Instructions (Signed)
1. Remove surgical dressings in 2 days and apply gauze to the incision.  2. Keep incision clean and dry 3. Must wear brace at all times. May walk with brace on. 4. Take aspirin to prevent blood clots  Regional Anesthesia Blocks  1. Numbness or the inability to move the "blocked" extremity may last from 3-48 hours after placement. The length of time depends on the medication injected and your individual response to the medication. If the numbness is not going away after 48 hours, call your surgeon.  2. The extremity that is blocked will need to be protected until the numbness is gone and the  Strength has returned. Because you cannot feel it, you will need to take extra care to avoid injury. Because it may be weak, you may have difficulty moving it or using it. You may not know what position it is in without looking at it while the block is in effect.  3. For blocks in the legs and feet, returning to weight bearing and walking needs to be done carefully. You will need to wait until the numbness is entirely gone and the strength has returned. You should be able to move your leg and foot normally before you try and bear weight or walk. You will need someone to be with you when you first try to ensure you do not fall and possibly risk injury.  4. Bruising and tenderness at the needle site are common side effects and will resolve in a few days.  5. Persistent numbness or new problems with movement should be communicated to the surgeon or the [facility] [phone])/ [facility] [phone]).   Post Anesthesia Home Care Instructions  Activity: Get plenty of rest for the remainder of the day. A responsible adult should stay with you for 24 hours following the procedure.  For the next 24 hours, DO NOT: -Drive a car -Advertising copywriterperate machinery -Drink alcoholic beverages -Take any medication unless instructed by your physician -Make any legal decisions or sign important  papers.  Meals: Start with liquid foods such as gelatin or soup. Progress to regular foods as tolerated. Avoid greasy, spicy, heavy foods. If nausea and/or vomiting occur, drink only clear liquids until the nausea and/or vomiting subsides. Call your physician if vomiting continues.  Special Instructions/Symptoms: Your throat may feel dry or sore from the anesthesia or the breathing tube placed in your throat during surgery. If this causes discomfort, gargle with warm salt water. The discomfort should disappear within 24 hours.

## 2014-12-24 NOTE — Progress Notes (Signed)
Pt discharged to home, per Dr. Roda ShuttersXu, with bledsoe brace in place and with home equipment, walker.  Pt alert and oriented x4 with vital signs stable and in no apparent distress.  Pt and friend, Jeffrey Murphy, verbalized understanding of after care instructions.

## 2014-12-24 NOTE — Transfer of Care (Signed)
Immediate Anesthesia Transfer of Care Note  Patient: Jeffrey Murphy  Procedure(s) Performed: Procedure(s): RIGHT QUADRICEP TENDON REPAIR  (Right)  Patient Location: PACU  Anesthesia Type:GA combined with regional for post-op pain  Level of Consciousness: awake and patient cooperative  Airway & Oxygen Therapy: Patient Spontanous Breathing and Patient connected to face mask oxygen  Post-op Assessment: Report given to RN and Post -op Vital signs reviewed and stable  Post vital signs: Reviewed and stable  Last Vitals:  Filed Vitals:   12/24/14 0927  BP:   Pulse: 83  Temp:   Resp: 15    Complications: No apparent anesthesia complications

## 2014-12-24 NOTE — Anesthesia Preprocedure Evaluation (Addendum)
Anesthesia Evaluation  Patient identified by MRN, date of birth, ID band Patient awake    Reviewed: Allergy & Precautions, NPO status   Airway Mallampati: II   Neck ROM: Full    Dental  (+) Teeth Intact   Pulmonary  breath sounds clear to auscultation        Cardiovascular hypertension, Pt. on medications Rhythm:Regular Rate:Normal     Neuro/Psych    GI/Hepatic   Endo/Other  diabetes, Type 2, Oral Hypoglycemic Agents  Renal/GU      Musculoskeletal   Abdominal (+) + obese,   Peds  Hematology   Anesthesia Other Findings   Reproductive/Obstetrics                            Anesthesia Physical Anesthesia Plan  ASA: III  Anesthesia Plan: General   Post-op Pain Management:    Induction: Intravenous  Airway Management Planned: LMA  Additional Equipment:   Intra-op Plan:   Post-operative Plan: Extubation in OR  Informed Consent: I have reviewed the patients History and Physical, chart, labs and discussed the procedure including the risks, benefits and alternatives for the proposed anesthesia with the patient or authorized representative who has indicated his/her understanding and acceptance.   Dental advisory given  Plan Discussed with: CRNA and Surgeon  Anesthesia Plan Comments:         Anesthesia Quick Evaluation

## 2014-12-24 NOTE — Op Note (Signed)
Date of surgery: 12/24/2014  Preoperative diagnosis: Right quadriceps rupture  Postoperative diagnosis: Same  Procedure: Repair of right quadriceps tendon rupture and retinaculum  Surgeon: Glee ArvinMichael Murphy, M.D.  Anesthesia: Gen. and regional  Estimated blood loss: Minimal  Complications: None  Condition to PACU: Stable  Indications for procedure: Jeffrey Murphy is a 59 year old gentleman who sustained a quadriceps rupture from a mechanical fall last week. He presented here today for surgical treatment. The risks, benefits, and alternatives to surgery were discussed with the patient he understood and wished to proceed.  Description of procedure: The patient was identified in preoperative holding area. The operative site was marked by the surgeon and confirmed with the patient. The back to the operating room. Anesthesia was induced. Nausea target was placed right upper thigh. Right lower extremity was prepped and draped in standard sterile fashion. Timeout was performed. Preoperative antibiotics were given. A midline incision over the distal thigh and the knee was used to carry down to the tibial tubercle. Full-thickness flaps were created and elevated both medially and laterally off of the patella. The peritenon was sharply incised in line with the incision and elevated off of the patella tendon. The rupture was exposed. There was a large amount of hematoma and organized hematoma. This was irrigated out. The knee was thoroughly irrigated out with pulsatile lavage. The quadriceps tendon was freshened up using a 15 blade. The insertion of the quadriceps tendon onto the superior pole of the patella was also freshened up using a rongeur and knife. Once this was done we then placed 2 #5 FiberWire sutures up and down the quadriceps tendon a Krakw fashion. Once this was done we then placed 3 parallel drill holes through the patella. A [place] suture passer was used to deliver the sutures through the holes. The  knee was then brought into full extension. The sutures were tied down which brought the quadriceps tendon down onto the superior pole of the patella. We then also examined the retinaculum which was ruptured on both the medial and lateral sides. This was repaired using #2 Ethibond sutures. After this was done we thoroughly irrigated the wound with pulse lavage. The wound was closed in a layered fashion using 0 Vicryl for the subcutaneous fat, 2-0 Vicryl for the deep skin layer, 2-0 nylon for the skin. Dermabond was placed on the incision. Sterile dressings were applied. The tourniquet was deflated. Patient was placed in a knee immobilizer and extubated and transferred to the PACU in stable condition.  Disposition: The patient will be admitted overnight for observation pain control. We will place him in a Bledsoe brace from 0-30. He will be on aspirin for DVT prophylaxis. He will be discharged in the morning.  Jeffrey ReelN. Michael Xu, MD [facility] [phone] 11:27 AM

## 2014-12-24 NOTE — Progress Notes (Signed)
Assisted Dr. Massagee with right, ultrasound guided, femoral block. Side rails up, monitors on throughout procedure. See vital signs in flow sheet. Tolerated Procedure well. 

## 2014-12-25 ENCOUNTER — Encounter (HOSPITAL_BASED_OUTPATIENT_CLINIC_OR_DEPARTMENT_OTHER): Payer: Self-pay | Admitting: Orthopaedic Surgery

## 2015-03-01 ENCOUNTER — Ambulatory Visit (INDEPENDENT_AMBULATORY_CARE_PROVIDER_SITE_OTHER): Payer: BLUE CROSS/BLUE SHIELD | Admitting: Family Medicine

## 2015-03-01 VITALS — BP 108/78 | HR 113 | Temp 97.7°F | Resp 16 | Ht 68.75 in | Wt 207.0 lb

## 2015-03-01 DIAGNOSIS — H9202 Otalgia, left ear: Secondary | ICD-10-CM | POA: Diagnosis not present

## 2015-03-01 DIAGNOSIS — S01312A Laceration without foreign body of left ear, initial encounter: Secondary | ICD-10-CM

## 2015-03-01 NOTE — Progress Notes (Signed)
Procedure: Risk and benefits discussed and verbal consent obtained. The patient was anesthetized using 4 cc of 2% lidocaine.  The wound was scrubbed with soap and water. Sterile prep and drape. The wound was closed with 6-0 Vicryl.  The wound was then cleaned with water and a bandage was applied. Patient instructed to come back for suture removal in 10 days.

## 2015-03-01 NOTE — Progress Notes (Addendum)
Patient ID: Jeffrey Murphy, male   DOB: 1956/09/14, 59 y.o.   MRN: [id number]   This chart was scribed for Elvina Sidle, MD by [place], medical scribe at [facility].The patient was seen in exam room 06 and the patient's care was started at 4:21 PM.  Patient ID: Jeffrey Murphy MRN: [id number], DOB: 1956/09/27, 59 y.o. Date of Encounter: 03/01/2015  Primary Physician: Delorse Lek, MD  Chief Complaint:  Chief Complaint  Patient presents with   Laceration    right ear   HPI:  Jeffrey Murphy is a 59 y.o. male who presents to [facility] complaining of laceration to his right ear last night. Hit his head on a pine tree while walking through the woods. His tetanus is up to date. He works at [employer]. Married for 30 years, going through a rough patch and plans to sit down and work things out with his wife tonight  He admits to having had too much alcohol last night  Past Medical History  Diagnosis Date   Diabetes mellitus without complication    Arthritis    Hypertension    Wears glasses     reading   Home Meds: Prior to Admission medications   Medication Sig Start Date End Date Taking? Authorizing Provider  aspirin EC 325 MG tablet Take 1 tablet (325 mg total) by mouth 2 (two) times daily. 12/24/14  Yes Naiping Donnelly Stager, MD  lisinopril (PRINIVIL,ZESTRIL) 10 MG tablet Take 10 mg by mouth daily.    Yes Historical Provider, MD  metFORMIN (GLUCOPHAGE) 1000 MG tablet Take 1,000 mg by mouth 2 (two) times daily with a meal.   Yes Historical Provider, MD  acetaminophen (TYLENOL) 325 MG tablet Take 650 mg by mouth every 6 (six) hours as needed for headache (headache).    Historical Provider, MD    Allergies: No Known Allergies  History   Social History   Marital Status: Married    Spouse Name: N/A   Number of Children: N/A   Years of Education: N/A   Occupational History   Not on file.   Social History Main Topics    Smoking status: Never Smoker    Smokeless tobacco: Not on file   Alcohol Use: 6.0 oz/week    10 Cans of beer per week   Drug Use: No   Sexual Activity: Not on file   Other Topics Concern   Not on file   Social History Narrative    Review of Systems: Constitutional: negative for chills, fever, night sweats, weight changes, or fatigue  HEENT: negative for vision changes, hearing loss, congestion, rhinorrhea, ST, epistaxis, or sinus pressure Cardiovascular: negative for chest pain or palpitations Respiratory: negative for hemoptysis, wheezing, shortness of breath, or cough Abdominal: negative for abdominal pain, nausea, vomiting, diarrhea, or constipation Dermatological: negative for rash, Laceration to the right ear Neurologic: negative for headache, dizziness, or syncope All other systems reviewed and are otherwise negative with the exception to those above and in the HPI.  Physical Exam: Blood pressure 108/78, pulse 113, temperature 97.7 F (36.5 C), temperature source Oral, resp. rate 16, height 5' 8.75" (1.746 m), weight 207 lb (93.895 kg), SpO2 96 %., Body mass index is 30.8 kg/(m^2). General: Well developed, well nourished, in no acute distress. Head: Normocephalic, atraumatic, eyes without discharge, sclera non-icteric, nares are without discharge. Bilateral auditory canals clear, TM's are without perforation, pearly grey and translucent with reflective cone of light  bilaterally. Oral cavity moist, posterior pharynx without exudate, erythema, peritonsillar abscess, or post nasal drip.  Neck: Supple. No thyromegaly. Full ROM. No lymphadenopathy. Lungs: Clear bilaterally to auscultation without wheezes, rales, or rhonchi. Breathing is unlabored. Heart: RRR with S1 S2. No murmurs, rubs, or gallops appreciated. Abdomen: Soft, non-tender, non-distended with normoactive bowel sounds. No hepatomegaly. No rebound/guarding. No obvious abdominal masses. Msk:  Strength and tone normal  for age. Extremities/Skin: Warm and dry. No clubbing or cyanosis. No edema. No rashes or suspicious lesions. A 6 mm right ear laceration with a dangling fragment. Neuro: Alert and oriented X 3. Moves all extremities spontaneously. Gait is normal. CNII-XII grossly in tact. Psych:  Responds to questions appropriately with a normal affect.    ASSESSMENT AND PLAN:  59 y.o. year old male with  This chart was scribed in my presence and reviewed by me personally.    ICD-9-CM ICD-10-CM   1. Ear lobe laceration, left, initial encounter 872.01 S01.312A      Signed, Elvina SidleKurt Lauenstein, MD   Signed, Elvina SidleKurt Lauenstein, MD 03/01/2015 4:21 PM

## 2015-03-12 ENCOUNTER — Ambulatory Visit (INDEPENDENT_AMBULATORY_CARE_PROVIDER_SITE_OTHER): Payer: BLUE CROSS/BLUE SHIELD | Admitting: Family Medicine

## 2015-03-12 VITALS — BP 118/80 | HR 103 | Temp 98.7°F | Ht 68.75 in | Wt 212.2 lb

## 2015-03-12 DIAGNOSIS — Z4802 Encounter for removal of sutures: Secondary | ICD-10-CM

## 2015-03-12 DIAGNOSIS — S01311D Laceration without foreign body of right ear, subsequent encounter: Secondary | ICD-10-CM

## 2015-03-12 NOTE — Progress Notes (Signed)
Subjective:  This chart was scribed for Nilda SimmerKristi Cianna Kasparian, MD by Elveria Risingimelie Horne, Medial Scribe. This patient was seen in room 6 and the patient's care was started at 8:28 PM.    Patient ID: Jeffrey Murphy, male    DOB: 06/23/1956, 59 y.o.   MRN: [id number] Chief Complaint  Patient presents with  . Suture / Staple Removal    03/12/2015  Suture / Staple Removal   HPI HPI Comments: Jeffrey Murphy is a 59 y.o. male who presents to the [facility] requesting to have sutures removed from laceration to his right ear incurred 4/24. Patient instructed to return to the clinic to have sutures moved in ten days. Patient denies complications with his wound stating "I almost forgot they were in there."  Denies drainage, redness, pain.     Review of Systems  Constitutional: Negative for fever, chills, diaphoresis and fatigue.  Skin: Positive for wound. Negative for color change and rash.    Past Medical History  Diagnosis Date  . Diabetes mellitus without complication   . Arthritis   . Hypertension   . Wears glasses     reading   Past Surgical History  Procedure Laterality Date  . Inguinal hernia repair      right  . Pilonidal cyst excision      back  . Cyst removal neck    . Colonoscopy    . Quadriceps tendon repair Right 12/24/2014    Procedure: RIGHT QUADRICEP TENDON REPAIR ;  Surgeon: Cheral AlmasNaiping Michael Xu, MD;  Location: [facility];  Service: Orthopedics;  Laterality: Right;   No Known Allergies Current Outpatient Prescriptions  Medication Sig Dispense Refill  . acetaminophen (TYLENOL) 325 MG tablet Take 650 mg by mouth every 6 (six) hours as needed for headache (headache).    . ALPRAZolam (XANAX) 0.5 MG tablet Take 0.5 mg by mouth 3 (three) times daily.  0  . aspirin EC 325 MG tablet Take 1 tablet (325 mg total) by mouth 2 (two) times daily. 84 tablet 0  . cyclobenzaprine (FLEXERIL) 10 MG tablet   0  . lisinopril (PRINIVIL,ZESTRIL) 10 MG tablet Take 10  mg by mouth daily.     . metFORMIN (GLUCOPHAGE) 1000 MG tablet Take 1,000 mg by mouth 2 (two) times daily with a meal.     No current facility-administered medications for this visit.       Objective:    Triage Vitals: BP 118/80 mmHg  Pulse 103  Temp(Src) 98.7 F (37.1 C) (Oral)  Ht 5' 8.75" (1.746 m)  Wt 212 lb 4 oz (96.276 kg)  BMI 31.58 kg/m2 Physical Exam  Constitutional: He appears well-developed and well-nourished. No distress.  Skin: Skin is warm and dry.  R ear with well approximated wound without erythema, drainage, induration.  Eschar along distal aspect of wound.  Nursing note and vitals reviewed.   PROCEDURE: 4 SUTURES REMOVED FROM R EAR.  LARGE ESCHAR DISTAL EDGE; NO SUTURE UNDER ESCHAR COULD BE IDENTIFIED.    Assessment & Plan:   1. Laceration of right ear lobe, subsequent encounter    -Improving. -s/p suture removal.  -local wound care reviewed again; RTC for pain, redness, drainage.   Meds ordered this encounter  Medications  . ALPRAZolam (XANAX) 0.5 MG tablet    Sig: Take 0.5 mg by mouth 3 (three) times daily.    Refill:  0  . cyclobenzaprine (FLEXERIL) 10 MG tablet    Sig:  Refill:  0    No Follow-up on file.    I personally performed the services described in this documentation, which was scribed in my presence. The recorded information has been reviewed and considered.  Arlicia Paquette Paulita FujitaMartin Chioke Noxon, M.D. [facility]  [place] [address] [place], [place]  [id number] [phone] phone [phone] fax

## 2015-03-19 ENCOUNTER — Encounter: Payer: Self-pay | Admitting: Family Medicine

## 2015-04-05 ENCOUNTER — Emergency Department (HOSPITAL_COMMUNITY)
Admission: EM | Admit: 2015-04-05 | Discharge: 2015-04-05 | Discharge status: Absent without leave | Payer: BLUE CROSS/BLUE SHIELD | Source: Home / Self Care

## 2015-04-05 ENCOUNTER — Emergency Department (HOSPITAL_COMMUNITY)
Admission: EM | Admit: 2015-04-05 | Discharge: 2015-04-05 | Disposition: A | Discharge status: Home / Self Care | Payer: BLUE CROSS/BLUE SHIELD | Source: Home / Self Care | Attending: Emergency Medicine | Admitting: Emergency Medicine

## 2015-04-05 ENCOUNTER — Encounter (HOSPITAL_COMMUNITY): Payer: Self-pay | Admitting: *Deleted

## 2015-04-05 DIAGNOSIS — L259 Unspecified contact dermatitis, unspecified cause: Secondary | ICD-10-CM | POA: Diagnosis not present

## 2015-04-05 MED ORDER — METHYLPREDNISOLONE ACETATE 80 MG/ML IJ SUSP
INTRAMUSCULAR | Status: AC
  Filled 2015-04-05: qty 1

## 2015-04-05 MED ORDER — HYDROXYZINE HCL 25 MG PO TABS: 25.0000 mg | ORAL_TABLET | Freq: Four times a day (QID) | ORAL | Status: DC | PRN

## 2015-04-05 MED ORDER — PREDNISONE 20 MG PO TABS: ORAL_TABLET | ORAL | Status: DC

## 2015-04-05 MED ORDER — METHYLPREDNISOLONE ACETATE 80 MG/ML IJ SUSP
80.0000 mg | Freq: Once | INTRAMUSCULAR | Status: AC
  Administered 2015-04-05: 80 mg via INTRAMUSCULAR

## 2015-04-05 NOTE — Discharge Instructions (Signed)
You are having a reaction to something. It is likely poison ivy. We gave you a shot of a steroid today. Take the prednisone as prescribed, starting tomorrow. Use hydroxyzine every 6 hours as needed for itching. If this medicine is expensive, you can use Benadryl instead. You should see improvement in the next 2-3 days.

## 2015-04-05 NOTE — ED Notes (Signed)
Over past week: pt has had 2 ticks on him (not latched on), has been in woods, has been killing poison ivy, and has been exposed to poison oak.  Started with pruritic rash to LUE, but now has raised, red, pruritic rash to torso and RLE.

## 2015-04-05 NOTE — ED Provider Notes (Signed)
CSN: [id number]     Arrival date & time 04/05/15  1628 History   First MD Initiated Contact with Patient 04/05/15 1648     Chief Complaint  Patient presents with  . Rash   (Consider location/radiation/quality/duration/timing/severity/associated sxs/prior Treatment) HPI  He is a 59 year old man here for evaluation of rash. The rash started a few days ago on the left upper arm. It has since spread to his chest, back, and right leg. It is very itchy. No fevers or chills. No difficulty breathing. He has been out in the woods multiple times over the last few weeks. He reports finding 2 ticks, but neither of them had latched on. He has also been spraying poison ivy around his house.  Past Medical History  Diagnosis Date  . Diabetes mellitus without complication   . Arthritis   . Hypertension   . Wears glasses     reading   Past Surgical History  Procedure Laterality Date  . Inguinal hernia repair      right  . Pilonidal cyst excision      back  . Cyst removal neck    . Colonoscopy    . Quadriceps tendon repair Right 12/24/2014    Procedure: RIGHT QUADRICEP TENDON REPAIR ;  Surgeon: Cheral Almas, MD;  Location: [facility];  Service: Orthopedics;  Laterality: Right;  . Replacement total knee    . Joint replacement      right total knee   No family history on file. History  Substance Use Topics  . Smoking status: Never Smoker   . Smokeless tobacco: Never Used  . Alcohol Use: Yes    Review of Systems As in history of present illness Allergies  Review of patient's allergies indicates no known allergies.  Home Medications   Prior to Admission medications   Medication Sig Start Date End Date Taking? Authorizing Provider  ALPRAZolam Prudy Feeler) 0.5 MG tablet Take 0.5 mg by mouth 3 (three) times daily. 02/18/15  Yes Historical Provider, MD  cyclobenzaprine (FLEXERIL) 10 MG tablet  01/01/15  Yes Historical Provider, MD  lisinopril (PRINIVIL,ZESTRIL) 10 MG tablet Take  10 mg by mouth daily.    Yes Historical Provider, MD  metFORMIN (GLUCOPHAGE) 1000 MG tablet Take 1,000 mg by mouth 2 (two) times daily with a meal.   Yes Historical Provider, MD  acetaminophen (TYLENOL) 325 MG tablet Take 650 mg by mouth every 6 (six) hours as needed for headache (headache).    Historical Provider, MD  hydrOXYzine (ATARAX/VISTARIL) 25 MG tablet Take 1 tablet (25 mg total) by mouth every 6 (six) hours as needed for itching. 04/05/15   Charm Rings, MD  predniSONE (DELTASONE) 20 MG tablet Take 3 pills daily for 4 days, then 2 pills daily for 4 days, then 1 pill daily for 4 days. 04/05/15   Charm Rings, MD   BP 177/110 mmHg  Pulse 118  Temp(Src) 97.6 F (36.4 C) (Oral)  Resp 16  SpO2 95% Physical Exam  Constitutional: He is oriented to person, place, and time. He appears well-developed and well-nourished. No distress.  Cardiovascular: Normal rate.   Pulmonary/Chest: Effort normal.  Neurological: He is alert and oriented to person, place, and time.  Skin:  Erythematous maculopapular rash on left upper arm, chest, back, right leg.    ED Course  Procedures (including critical care time) Labs Review Labs Reviewed - No data to display  Imaging Review No results found.   MDM   1. Contact dermatitis  Depo-Medrol 80 mg IM given.  We'll treat with a prednisone taper. Hydroxyzine or Benadryl for itching. Follow-up as needed.    Charm RingsErin J Johsua Shevlin, MD 04/05/15 [phone]

## 2016-04-19 DIAGNOSIS — K219 Gastro-esophageal reflux disease without esophagitis: Secondary | ICD-10-CM | POA: Diagnosis present

## 2016-04-19 DIAGNOSIS — F419 Anxiety disorder, unspecified: Secondary | ICD-10-CM | POA: Diagnosis present

## 2016-04-19 DIAGNOSIS — I10 Essential (primary) hypertension: Secondary | ICD-10-CM | POA: Diagnosis present

## 2016-11-19 ENCOUNTER — Emergency Department (HOSPITAL_COMMUNITY)
Admission: EM | Admit: 2016-11-19 | Discharge: 2016-11-19 | Disposition: A | Discharge status: Home / Self Care | Payer: BLUE CROSS/BLUE SHIELD | Attending: Emergency Medicine | Admitting: Emergency Medicine

## 2016-11-19 ENCOUNTER — Encounter (HOSPITAL_COMMUNITY): Payer: Self-pay | Admitting: Emergency Medicine

## 2016-11-19 DIAGNOSIS — R739 Hyperglycemia, unspecified: Secondary | ICD-10-CM

## 2016-11-19 DIAGNOSIS — Z7982 Long term (current) use of aspirin: Secondary | ICD-10-CM | POA: Insufficient documentation

## 2016-11-19 DIAGNOSIS — T401X1A Poisoning by heroin, accidental (unintentional), initial encounter: Secondary | ICD-10-CM

## 2016-11-19 DIAGNOSIS — Z79899 Other long term (current) drug therapy: Secondary | ICD-10-CM | POA: Insufficient documentation

## 2016-11-19 DIAGNOSIS — I159 Secondary hypertension, unspecified: Secondary | ICD-10-CM | POA: Insufficient documentation

## 2016-11-19 DIAGNOSIS — E1165 Type 2 diabetes mellitus with hyperglycemia: Secondary | ICD-10-CM | POA: Insufficient documentation

## 2016-11-19 DIAGNOSIS — Z9114 Patient's other noncompliance with medication regimen: Secondary | ICD-10-CM

## 2016-11-19 DIAGNOSIS — Z96651 Presence of right artificial knee joint: Secondary | ICD-10-CM | POA: Insufficient documentation

## 2016-11-19 DIAGNOSIS — Z7984 Long term (current) use of oral hypoglycemic drugs: Secondary | ICD-10-CM | POA: Insufficient documentation

## 2016-11-19 LAB — I-STAT CHEM 8, ED
BUN: 9 mg/dL (ref 6–20)
Calcium, Ion: 1.15 mmol/L (ref 1.15–1.40)
Chloride: 99 mmol/L — ABNORMAL LOW (ref 101–111)
Creatinine, Ser: 0.5 mg/dL — ABNORMAL LOW (ref 0.61–1.24)
Glucose, Bld: 380 mg/dL — ABNORMAL HIGH (ref 65–99)
HCT: 55 % — ABNORMAL HIGH (ref 39.0–52.0)
Hemoglobin: 18.7 g/dL — ABNORMAL HIGH (ref 13.0–17.0)
POTASSIUM: 3.7 mmol/L (ref 3.5–5.1)
SODIUM: 138 mmol/L (ref 135–145)
TCO2: 26 mmol/L (ref 0–100)

## 2016-11-19 LAB — I-STAT TROPONIN, ED: Troponin i, poc: 0.01 ng/mL (ref 0.00–0.08)

## 2016-11-19 MED ORDER — METFORMIN HCL 1000 MG PO TABS: 1000.0000 mg | ORAL_TABLET | Freq: Two times a day (BID) | ORAL | 0 refills | Status: DC

## 2016-11-19 MED ORDER — LISINOPRIL 10 MG PO TABS: 10.0000 mg | ORAL_TABLET | Freq: Every day | ORAL | 0 refills | Status: DC

## 2016-11-19 NOTE — ED Provider Notes (Signed)
[facility]-EMERGENCY DEPT Provider Note   CSN: [id number] Arrival date & time: 11/19/16  1726     History   Chief Complaint Chief Complaint  Patient presents with  . Drug Overdose    HPI Jeffrey Murphy is a 61 y.o. male.  He presents for evaluation of altered mental status following smoking heroin. Treat this seen by a bystander friend who did CPR, then EMS gave him Narcan with improvement. Patient is remorseful now on understands the dangers of using these type of illegal drugs. He requests referral to a PCP to get restarted on his diabetes medicine, and received ongoing medical care. He denies headache, weakness, dizziness, nausea, vomiting, chest pain or back pain. There are no other known modifying factors.  HPI  Past Medical History:  Diagnosis Date  . Arthritis   . Diabetes mellitus without complication (HCC)   . Hypertension   . Wears glasses    reading    Patient Active Problem List   Diagnosis Date Noted  . Rupture of right quadriceps tendon 12/24/2014  . Quadriceps tendon rupture 12/24/2014    Past Surgical History:  Procedure Laterality Date  . COLONOSCOPY    . CYST REMOVAL NECK    . INGUINAL HERNIA REPAIR     right  . JOINT REPLACEMENT     right total knee  . PILONIDAL CYST EXCISION     back  . QUADRICEPS TENDON REPAIR Right 12/24/2014   Procedure: RIGHT QUADRICEP TENDON REPAIR ;  Surgeon: Cheral Almas, MD;  Location: [facility];  Service: Orthopedics;  Laterality: Right;  . REPLACEMENT TOTAL KNEE         Home Medications    Prior to Admission medications   Medication Sig Start Date End Date Taking? Authorizing Provider  traZODone (DESYREL) 50 MG tablet Take 50-100 mg by mouth at bedtime. 04/21/16  Yes Historical Provider, MD  acetaminophen (TYLENOL) 325 MG tablet Take 650 mg by mouth every 6 (six) hours as needed for headache (headache).    Historical Provider, MD  ALPRAZolam Prudy Feeler) 0.5 MG tablet Take 0.5 mg by mouth 3 (three)  times daily. 02/18/15   Historical Provider, MD  aspirin EC 325 MG tablet Take 325 mg by mouth daily.    Historical Provider, MD  cyclobenzaprine (FLEXERIL) 10 MG tablet  01/01/15   Historical Provider, MD  hydrOXYzine (ATARAX/VISTARIL) 25 MG tablet Take 1 tablet (25 mg total) by mouth every 6 (six) hours as needed for itching. 04/05/15   Charm Rings, MD  lisinopril (PRINIVIL,ZESTRIL) 10 MG tablet Take 1 tablet (10 mg total) by mouth daily. 11/19/16   Mancel Bale, MD  metFORMIN (GLUCOPHAGE) 1000 MG tablet Take 1 tablet (1,000 mg total) by mouth 2 (two) times daily. 11/19/16   Mancel Bale, MD  predniSONE (DELTASONE) 20 MG tablet Take 3 pills daily for 4 days, then 2 pills daily for 4 days, then 1 pill daily for 4 days. 04/05/15   Charm Rings, MD    Family History No family history on file.  Social History Social History  Substance Use Topics  . Smoking status: Never Smoker  . Smokeless tobacco: Never Used  . Alcohol use Yes     Allergies   Patient has no known allergies.   Review of Systems Review of Systems  All other systems reviewed and are negative.    Physical Exam Updated Vital Signs BP 112/83   Pulse 87   Temp 98.4 F (36.9 C)   Resp 14  SpO2 93%   Physical Exam  Constitutional: He is oriented to person, place, and time. He appears well-developed and well-nourished.  HENT:  Head: Normocephalic and atraumatic.  Right Ear: External ear normal.  Left Ear: External ear normal.  Eyes: Conjunctivae and EOM are normal. Pupils are equal, round, and reactive to light.  Neck: Normal range of motion and phonation normal. Neck supple.  Cardiovascular: Normal rate, regular rhythm and normal heart sounds.   Pulmonary/Chest: Effort normal and breath sounds normal. He exhibits no bony tenderness.  Abdominal: Soft. There is no tenderness.  Musculoskeletal: Normal range of motion.  Neurological: He is alert and oriented to person, place, and time. No cranial nerve deficit or  sensory deficit. He exhibits normal muscle tone. Coordination normal.  Skin: Skin is warm, dry and intact.  Psychiatric: He has a normal mood and affect. His behavior is normal. Judgment and thought content normal.  Nursing note and vitals reviewed.    ED Treatments / Results  Labs (all labs ordered are listed, but only abnormal results are displayed) Labs Reviewed  I-STAT CHEM 8, ED - Abnormal; Notable for the following:       Result Value   Chloride 99 (*)    Creatinine, Ser 0.50 (*)    Glucose, Bld 380 (*)    Hemoglobin 18.7 (*)    HCT 55.0 (*)    All other components within normal limits  I-STAT TROPOININ, ED    EKG  EKG Interpretation None       Radiology No results found.  Procedures Procedures (including critical care time)  Medications Ordered in ED Medications - No data to display   Initial Impression / Assessment and Plan / ED Course  I have reviewed the triage vital signs and the nursing notes.  Pertinent labs & imaging results that were available during my care of the patient were reviewed by me and considered in my medical decision making (see chart for details).  Clinical Course     Medications - No data to display  Patient Vitals for the past 24 hrs:  BP Temp Pulse Resp SpO2  11/19/16 1900 112/83 - 87 14 93 %  11/19/16 1855 106/89 - 89 12 97 %  11/19/16 1823 121/87 - 89 15 98 %  11/19/16 1728 134/88 98.4 F (36.9 C) 102 18 93 %  11/19/16 1727 - - - - 94 %    7:35 PM Reevaluation with update and discussion. After initial assessment and treatment, an updated evaluation reveals He remains alert and comfortable. He has no further complaints. Findings discussed with the patient and all questions were answered. [name] L    Final Clinical Impressions(s) / ED Diagnoses   Final diagnoses:  Accidental overdose of heroin, initial encounter  Hyperglycemia  Secondary hypertension  Noncompliance with medication regimen   Apparent heroin  overdose, stabilizing the emergency department after observation. Medically noncompliant with usual medications, which were refilled today.  Nursing Notes Reviewed/ Care Coordinated Applicable Imaging Reviewed Interpretation of Laboratory Data incorporated into ED treatment  The patient appears reasonably screened and/or stabilized for discharge and I doubt any other medical condition or other [facility] requiring further screening, evaluation, or treatment in the ED at this time prior to discharge.  Plan: Home Medications- continue; Home Treatments- rest; return here if the recommended treatment, does not improve the symptoms; Recommended follow up- PCP prn   New Prescriptions New Prescriptions   LISINOPRIL (PRINIVIL,ZESTRIL) 10 MG TABLET    Take 1 tablet (10 mg  total) by mouth daily.   METFORMIN (GLUCOPHAGE) 1000 MG TABLET    Take 1 tablet (1,000 mg total) by mouth 2 (two) times daily.     Mancel Bale, MD 11/19/16 [phone]

## 2016-11-19 NOTE — ED Notes (Signed)
Bed: RESB Expected date:  Expected time:  Means of arrival:  Comments: 61 yo heroin OD

## 2016-11-19 NOTE — ED Triage Notes (Addendum)
Pt from home via EMS after OD on heroin. Pt was found in resp arrest in passenger seat of car. Pt friend did chest compressions. Fire bagged pt and gave 2 mg IN Narcan to which pt responded. Pt was A&O x4 on scene. Pt reports that he smoked marijuana today but has never done heroin. Pt sts that he normally smokes crack and he thought that was what he was smoking. En route, pt was given 4 zofran for nausea. Pt arrives A&O and in NAD

## 2016-11-19 NOTE — ED Triage Notes (Signed)
Pt is responsive to voice and wakes immediately for triage. As soon as pt stops talking, sats drop to 88%. Pt placed on 3L O2. Pt now satting at 91%, RR 16. MD made aware of pt status.

## 2016-11-19 NOTE — ED Notes (Signed)
He remains in no distress. 

## 2016-12-08 DIAGNOSIS — L0291 Cutaneous abscess, unspecified: Secondary | ICD-10-CM

## 2016-12-08 HISTORY — DX: Cutaneous abscess, unspecified: L02.91

## 2016-12-12 ENCOUNTER — Encounter (HOSPITAL_COMMUNITY)
Admission: EM | Disposition: A | Discharge status: Home Health | Payer: Self-pay | Source: Home / Self Care | Attending: Internal Medicine

## 2016-12-12 ENCOUNTER — Emergency Department (HOSPITAL_COMMUNITY): Payer: Self-pay

## 2016-12-12 ENCOUNTER — Encounter (HOSPITAL_COMMUNITY): Payer: Self-pay | Admitting: Emergency Medicine

## 2016-12-12 ENCOUNTER — Inpatient Hospital Stay (HOSPITAL_COMMUNITY): Payer: Self-pay | Admitting: Certified Registered Nurse Anesthetist

## 2016-12-12 ENCOUNTER — Inpatient Hospital Stay (HOSPITAL_COMMUNITY)
Admission: EM | Admit: 2016-12-12 | Discharge: 2016-12-17 | DRG: 603 | Disposition: A | Discharge status: Home Health | Payer: Self-pay | Attending: Internal Medicine | Admitting: Internal Medicine

## 2016-12-12 DIAGNOSIS — E1165 Type 2 diabetes mellitus with hyperglycemia: Secondary | ICD-10-CM | POA: Diagnosis present

## 2016-12-12 DIAGNOSIS — L02214 Cutaneous abscess of groin: Principal | ICD-10-CM | POA: Diagnosis present

## 2016-12-12 DIAGNOSIS — K219 Gastro-esophageal reflux disease without esophagitis: Secondary | ICD-10-CM | POA: Diagnosis present

## 2016-12-12 DIAGNOSIS — F419 Anxiety disorder, unspecified: Secondary | ICD-10-CM | POA: Diagnosis present

## 2016-12-12 DIAGNOSIS — M1990 Unspecified osteoarthritis, unspecified site: Secondary | ICD-10-CM | POA: Diagnosis present

## 2016-12-12 DIAGNOSIS — N492 Inflammatory disorders of scrotum: Secondary | ICD-10-CM | POA: Diagnosis present

## 2016-12-12 DIAGNOSIS — Z9119 Patient's noncompliance with other medical treatment and regimen: Secondary | ICD-10-CM

## 2016-12-12 DIAGNOSIS — E785 Hyperlipidemia, unspecified: Secondary | ICD-10-CM | POA: Diagnosis present

## 2016-12-12 DIAGNOSIS — F1910 Other psychoactive substance abuse, uncomplicated: Secondary | ICD-10-CM

## 2016-12-12 DIAGNOSIS — E118 Type 2 diabetes mellitus with unspecified complications: Secondary | ICD-10-CM

## 2016-12-12 DIAGNOSIS — E669 Obesity, unspecified: Secondary | ICD-10-CM | POA: Diagnosis present

## 2016-12-12 DIAGNOSIS — R000 Tachycardia, unspecified: Secondary | ICD-10-CM | POA: Diagnosis present

## 2016-12-12 DIAGNOSIS — F1410 Cocaine abuse, uncomplicated: Secondary | ICD-10-CM | POA: Diagnosis present

## 2016-12-12 DIAGNOSIS — R739 Hyperglycemia, unspecified: Secondary | ICD-10-CM

## 2016-12-12 DIAGNOSIS — A4901 Methicillin susceptible Staphylococcus aureus infection, unspecified site: Secondary | ICD-10-CM | POA: Diagnosis present

## 2016-12-12 DIAGNOSIS — I10 Essential (primary) hypertension: Secondary | ICD-10-CM | POA: Diagnosis present

## 2016-12-12 DIAGNOSIS — A419 Sepsis, unspecified organism: Secondary | ICD-10-CM

## 2016-12-12 DIAGNOSIS — F1210 Cannabis abuse, uncomplicated: Secondary | ICD-10-CM | POA: Diagnosis present

## 2016-12-12 DIAGNOSIS — Z6831 Body mass index (BMI) 31.0-31.9, adult: Secondary | ICD-10-CM

## 2016-12-12 DIAGNOSIS — G4700 Insomnia, unspecified: Secondary | ICD-10-CM | POA: Diagnosis present

## 2016-12-12 HISTORY — PX: IRRIGATION AND DEBRIDEMENT ABSCESS: SHX5252

## 2016-12-12 HISTORY — DX: Cutaneous abscess, unspecified: L02.91

## 2016-12-12 LAB — URINALYSIS, ROUTINE W REFLEX MICROSCOPIC
Bilirubin Urine: NEGATIVE
Glucose, UA: >=500 mg/dL — AB
Hgb urine dipstick: NEGATIVE
Ketones, ur: NEGATIVE mg/dL
Leukocytes, UA: NEGATIVE
Nitrite: NEGATIVE
PH: 6 (ref 5.0–8.0)
Protein, ur: NEGATIVE mg/dL
SPECIFIC GRAVITY, URINE: 1.033 — AB (ref 1.005–1.030)

## 2016-12-12 LAB — CBC WITH DIFFERENTIAL/PLATELET
Basophils Absolute: 0 10*3/uL (ref 0.0–0.1)
Basophils Relative: 0 %
EOS ABS: 0.1 10*3/uL (ref 0.0–0.7)
EOS PCT: 1 %
HCT: 40.4 % (ref 39.0–52.0)
Hemoglobin: 13.8 g/dL (ref 13.0–17.0)
LYMPHS ABS: 1.1 10*3/uL (ref 0.7–4.0)
Lymphocytes Relative: 7 %
MCH: 28.7 pg (ref 26.0–34.0)
MCHC: 34.2 g/dL (ref 30.0–36.0)
MCV: 84 fL (ref 78.0–100.0)
MONO ABS: 0.8 10*3/uL (ref 0.1–1.0)
MONOS PCT: 5 %
Neutro Abs: 13.8 10*3/uL — ABNORMAL HIGH (ref 1.7–7.7)
Neutrophils Relative %: 87 %
PLATELETS: 271 10*3/uL (ref 150–400)
RBC: 4.81 MIL/uL (ref 4.22–5.81)
RDW: 12.7 % (ref 11.5–15.5)
WBC: 15.8 10*3/uL — AB (ref 4.0–10.5)

## 2016-12-12 LAB — I-STAT CHEM 8, ED
BUN: 24 mg/dL — ABNORMAL HIGH (ref 6–20)
Calcium, Ion: 1.01 mmol/L — ABNORMAL LOW (ref 1.15–1.40)
Chloride: 89 mmol/L — ABNORMAL LOW (ref 101–111)
Creatinine, Ser: 0.7 mg/dL (ref 0.61–1.24)
Glucose, Bld: 438 mg/dL — ABNORMAL HIGH (ref 65–99)
HEMATOCRIT: 44 % (ref 39.0–52.0)
Hemoglobin: 15 g/dL (ref 13.0–17.0)
POTASSIUM: 5.1 mmol/L (ref 3.5–5.1)
SODIUM: 126 mmol/L — AB (ref 135–145)
TCO2: 30 mmol/L (ref 0–100)

## 2016-12-12 LAB — COMPREHENSIVE METABOLIC PANEL
ALBUMIN: 2.6 g/dL — AB (ref 3.5–5.0)
ALK PHOS: 85 U/L (ref 38–126)
ALT: 11 U/L — ABNORMAL LOW (ref 17–63)
ANION GAP: 14 (ref 5–15)
AST: 16 U/L (ref 15–41)
BILIRUBIN TOTAL: 0.6 mg/dL (ref 0.3–1.2)
BUN: 16 mg/dL (ref 6–20)
CALCIUM: 8.9 mg/dL (ref 8.9–10.3)
CO2: 24 mmol/L (ref 22–32)
CREATININE: 0.82 mg/dL (ref 0.61–1.24)
Chloride: 89 mmol/L — ABNORMAL LOW (ref 101–111)
GFR calc non Af Amer: >60 mL/min (ref >60)
GLUCOSE: 417 mg/dL — AB (ref 65–99)
Potassium: 4.3 mmol/L (ref 3.5–5.1)
Sodium: 127 mmol/L — ABNORMAL LOW (ref 135–145)
TOTAL PROTEIN: 7.8 g/dL (ref 6.5–8.1)

## 2016-12-12 LAB — RAPID URINE DRUG SCREEN, HOSP PERFORMED
Amphetamines: NOT DETECTED
Barbiturates: NOT DETECTED
Benzodiazepines: POSITIVE — AB
Cocaine: POSITIVE — AB
Opiates: NOT DETECTED
Tetrahydrocannabinol: NOT DETECTED

## 2016-12-12 LAB — CBG MONITORING, ED: Glucose-Capillary: 262 mg/dL — ABNORMAL HIGH (ref 65–99)

## 2016-12-12 LAB — GLUCOSE, CAPILLARY: GLUCOSE-CAPILLARY: 249 mg/dL — AB (ref 65–99)

## 2016-12-12 LAB — I-STAT CG4 LACTIC ACID, ED
LACTIC ACID, VENOUS: 1.27 mmol/L (ref 0.5–1.9)
Lactic Acid, Venous: 2.96 mmol/L (ref 0.5–1.9)

## 2016-12-12 LAB — LACTIC ACID, PLASMA: Lactic Acid, Venous: 1.8 mmol/L (ref 0.5–1.9)

## 2016-12-12 LAB — PROTIME-INR
INR: 1.16
PROTHROMBIN TIME: 14.9 s (ref 11.4–15.2)

## 2016-12-12 LAB — ETHANOL

## 2016-12-12 SURGERY — IRRIGATION AND DEBRIDEMENT ABSCESS
Anesthesia: General | Laterality: Left

## 2016-12-12 MED ORDER — 0.9 % SODIUM CHLORIDE (POUR BTL) OPTIME
TOPICAL | Status: DC | PRN
  Administered 2016-12-12: 1000 mL

## 2016-12-12 MED ORDER — SUCCINYLCHOLINE CHLORIDE 20 MG/ML IJ SOLN
INTRAMUSCULAR | Status: DC | PRN
  Administered 2016-12-12: 120 mg via INTRAVENOUS

## 2016-12-12 MED ORDER — LORAZEPAM 1 MG PO TABS
1.0000 mg | ORAL_TABLET | Freq: Four times a day (QID) | ORAL | Status: AC | PRN
  Filled 2016-12-12: qty 1

## 2016-12-12 MED ORDER — FENTANYL CITRATE (PF) 100 MCG/2ML IJ SOLN: 25.0000 ug | INTRAMUSCULAR | Status: DC | PRN

## 2016-12-12 MED ORDER — SODIUM CHLORIDE 0.9 % IV SOLN
INTRAVENOUS | Status: DC
  Administered 2016-12-13 – 2016-12-17 (×7): via INTRAVENOUS

## 2016-12-12 MED ORDER — ONDANSETRON HCL 4 MG/2ML IJ SOLN: 4.0000 mg | Freq: Four times a day (QID) | INTRAMUSCULAR | Status: DC | PRN

## 2016-12-12 MED ORDER — MEPERIDINE HCL 25 MG/ML IJ SOLN: 6.2500 mg | INTRAMUSCULAR | Status: DC | PRN

## 2016-12-12 MED ORDER — VITAMIN B-1 100 MG PO TABS
100.0000 mg | ORAL_TABLET | Freq: Every day | ORAL | Status: DC
  Administered 2016-12-13 – 2016-12-17 (×5): 100 mg via ORAL
  Filled 2016-12-12 (×5): qty 1

## 2016-12-12 MED ORDER — PIPERACILLIN-TAZOBACTAM 3.375 G IVPB
3.3750 g | Freq: Three times a day (TID) | INTRAVENOUS | Status: DC
  Administered 2016-12-13 – 2016-12-15 (×9): 3.375 g via INTRAVENOUS
  Filled 2016-12-12 (×10): qty 50

## 2016-12-12 MED ORDER — THIAMINE HCL 100 MG/ML IJ SOLN
100.0000 mg | Freq: Every day | INTRAMUSCULAR | Status: DC
  Filled 2016-12-12: qty 2

## 2016-12-12 MED ORDER — MIDAZOLAM HCL 5 MG/5ML IJ SOLN
INTRAMUSCULAR | Status: DC | PRN
Start: 2016-12-12 — End: 2016-12-12
  Administered 2016-12-12: 1 mg via INTRAVENOUS

## 2016-12-12 MED ORDER — PROMETHAZINE HCL 25 MG/ML IJ SOLN: 6.2500 mg | INTRAMUSCULAR | Status: DC | PRN

## 2016-12-12 MED ORDER — MIDAZOLAM HCL 2 MG/2ML IJ SOLN: 0.5000 mg | Freq: Once | INTRAMUSCULAR | Status: DC | PRN

## 2016-12-12 MED ORDER — HYDROMORPHONE HCL 2 MG/ML IJ SOLN
0.5000 mg | INTRAMUSCULAR | Status: DC | PRN
  Administered 2016-12-14: 1 mg via INTRAVENOUS
  Administered 2016-12-14 – 2016-12-17 (×8): 2 mg via INTRAVENOUS
  Filled 2016-12-12 (×9): qty 1

## 2016-12-12 MED ORDER — MAGNESIUM CITRATE PO SOLN: 1.0000 | Freq: Once | ORAL | Status: DC | PRN

## 2016-12-12 MED ORDER — VANCOMYCIN HCL IN DEXTROSE 1-5 GM/200ML-% IV SOLN
1000.0000 mg | Freq: Two times a day (BID) | INTRAVENOUS | Status: DC
  Administered 2016-12-12: 1000 mg via INTRAVENOUS
  Filled 2016-12-12 (×2): qty 200

## 2016-12-12 MED ORDER — LISINOPRIL 10 MG PO TABS
10.0000 mg | ORAL_TABLET | Freq: Every day | ORAL | Status: DC
  Administered 2016-12-13 – 2016-12-17 (×5): 10 mg via ORAL
  Filled 2016-12-12 (×5): qty 1

## 2016-12-12 MED ORDER — ALPRAZOLAM 0.5 MG PO TABS
0.5000 mg | ORAL_TABLET | Freq: Three times a day (TID) | ORAL | Status: DC
  Administered 2016-12-13 – 2016-12-17 (×13): 0.5 mg via ORAL
  Filled 2016-12-12 (×14): qty 1

## 2016-12-12 MED ORDER — HYDROMORPHONE HCL 2 MG/ML IJ SOLN: 0.5000 mg | INTRAMUSCULAR | Status: DC | PRN

## 2016-12-12 MED ORDER — ADULT MULTIVITAMIN W/MINERALS CH
1.0000 | ORAL_TABLET | Freq: Every day | ORAL | Status: DC
  Administered 2016-12-13 – 2016-12-17 (×5): 1 via ORAL
  Filled 2016-12-12 (×5): qty 1

## 2016-12-12 MED ORDER — PROPOFOL 10 MG/ML IV BOLUS
INTRAVENOUS | Status: DC | PRN
  Administered 2016-12-12: 120 mg via INTRAVENOUS

## 2016-12-12 MED ORDER — FENTANYL CITRATE (PF) 100 MCG/2ML IJ SOLN
INTRAMUSCULAR | Status: DC | PRN
  Administered 2016-12-12: 50 ug via INTRAVENOUS
  Administered 2016-12-12: 100 ug via INTRAVENOUS
  Administered 2016-12-12 (×3): 50 ug via INTRAVENOUS

## 2016-12-12 MED ORDER — PROPOFOL 10 MG/ML IV BOLUS
INTRAVENOUS | Status: AC
  Filled 2016-12-12: qty 20

## 2016-12-12 MED ORDER — LORAZEPAM 2 MG/ML IJ SOLN: 1.0000 mg | Freq: Four times a day (QID) | INTRAMUSCULAR | Status: AC | PRN

## 2016-12-12 MED ORDER — SODIUM CHLORIDE 0.9 % IV BOLUS (SEPSIS)
1000.0000 mL | Freq: Once | INTRAVENOUS | Status: AC
  Administered 2016-12-12: 1000 mL via INTRAVENOUS

## 2016-12-12 MED ORDER — SODIUM CHLORIDE 0.9 % IV SOLN
INTRAVENOUS | Status: DC | PRN
  Administered 2016-12-12: 21:00:00 via INTRAVENOUS

## 2016-12-12 MED ORDER — SUGAMMADEX SODIUM 200 MG/2ML IV SOLN
INTRAVENOUS | Status: AC
  Filled 2016-12-12: qty 2

## 2016-12-12 MED ORDER — HYDROMORPHONE HCL 2 MG/ML IJ SOLN
0.5000 mg | Freq: Once | INTRAMUSCULAR | Status: AC
  Administered 2016-12-12: 0.5 mg via INTRAVENOUS
  Filled 2016-12-12: qty 1

## 2016-12-12 MED ORDER — FENTANYL CITRATE (PF) 100 MCG/2ML IJ SOLN
INTRAMUSCULAR | Status: AC
  Filled 2016-12-12: qty 2

## 2016-12-12 MED ORDER — ONDANSETRON HCL 4 MG PO TABS: 4.0000 mg | ORAL_TABLET | Freq: Four times a day (QID) | ORAL | Status: DC | PRN

## 2016-12-12 MED ORDER — TRAZODONE HCL 50 MG PO TABS
50.0000 mg | ORAL_TABLET | Freq: Every day | ORAL | Status: DC
Start: 2016-12-13 — End: 2016-12-12

## 2016-12-12 MED ORDER — SENNOSIDES-DOCUSATE SODIUM 8.6-50 MG PO TABS: 1.0000 | ORAL_TABLET | Freq: Every evening | ORAL | Status: DC | PRN

## 2016-12-12 MED ORDER — BISACODYL 10 MG RE SUPP: 10.0000 mg | Freq: Every day | RECTAL | Status: DC | PRN

## 2016-12-12 MED ORDER — ACETAMINOPHEN 10 MG/ML IV SOLN
INTRAVENOUS | Status: AC
  Filled 2016-12-12: qty 100

## 2016-12-12 MED ORDER — FOLIC ACID 1 MG PO TABS
1.0000 mg | ORAL_TABLET | Freq: Every day | ORAL | Status: DC
  Administered 2016-12-13 – 2016-12-17 (×5): 1 mg via ORAL
  Filled 2016-12-12 (×5): qty 1

## 2016-12-12 MED ORDER — ONDANSETRON HCL 4 MG/2ML IJ SOLN
INTRAMUSCULAR | Status: DC | PRN
  Administered 2016-12-12: 4 mg via INTRAVENOUS

## 2016-12-12 MED ORDER — IOPAMIDOL (ISOVUE-300) INJECTION 61%
INTRAVENOUS | Status: AC
  Administered 2016-12-12: 100 mL
  Filled 2016-12-12: qty 100

## 2016-12-12 MED ORDER — HYDROCODONE-ACETAMINOPHEN 5-325 MG PO TABS
1.0000 | ORAL_TABLET | ORAL | Status: DC | PRN
  Administered 2016-12-13: 2 via ORAL
  Administered 2016-12-13 (×2): 1 via ORAL
  Administered 2016-12-14 – 2016-12-16 (×3): 2 via ORAL
  Filled 2016-12-12: qty 1
  Filled 2016-12-12 (×4): qty 2
  Filled 2016-12-12: qty 1

## 2016-12-12 MED ORDER — VANCOMYCIN HCL 10 G IV SOLR
2000.0000 mg | Freq: Once | INTRAVENOUS | Status: AC
  Administered 2016-12-12: 2000 mg via INTRAVENOUS
  Filled 2016-12-12: qty 2000

## 2016-12-12 MED ORDER — LACTATED RINGERS IV SOLN
INTRAVENOUS | Status: DC | PRN
  Administered 2016-12-12: 21:00:00 via INTRAVENOUS

## 2016-12-12 MED ORDER — INSULIN ASPART 100 UNIT/ML ~~LOC~~ SOLN
0.0000 [IU] | Freq: Three times a day (TID) | SUBCUTANEOUS | Status: DC
  Administered 2016-12-13: 3 [IU] via SUBCUTANEOUS
  Administered 2016-12-13: 9 [IU] via SUBCUTANEOUS
  Administered 2016-12-13: 5 [IU] via SUBCUTANEOUS
  Administered 2016-12-14: 3 [IU] via SUBCUTANEOUS
  Administered 2016-12-14: 5 [IU] via SUBCUTANEOUS
  Administered 2016-12-14: 8 [IU] via SUBCUTANEOUS
  Administered 2016-12-15: 5 [IU] via SUBCUTANEOUS
  Administered 2016-12-15: 2 [IU] via SUBCUTANEOUS
  Administered 2016-12-15: 1 [IU] via SUBCUTANEOUS
  Administered 2016-12-16: 3 [IU] via SUBCUTANEOUS
  Administered 2016-12-16: 2 [IU] via SUBCUTANEOUS

## 2016-12-12 MED ORDER — FENTANYL CITRATE (PF) 100 MCG/2ML IJ SOLN
INTRAMUSCULAR | Status: AC
  Filled 2016-12-12: qty 4

## 2016-12-12 MED ORDER — MIDAZOLAM HCL 2 MG/2ML IJ SOLN
INTRAMUSCULAR | Status: AC
  Filled 2016-12-12: qty 2

## 2016-12-12 MED ORDER — ACETAMINOPHEN 325 MG PO TABS: 650.0000 mg | ORAL_TABLET | Freq: Four times a day (QID) | ORAL | Status: DC | PRN

## 2016-12-12 MED ORDER — ASPIRIN EC 325 MG PO TBEC
325.0000 mg | DELAYED_RELEASE_TABLET | Freq: Every day | ORAL | Status: DC
  Administered 2016-12-13 – 2016-12-17 (×5): 325 mg via ORAL
  Filled 2016-12-12 (×5): qty 1

## 2016-12-12 SURGICAL SUPPLY — 36 items
BLADE SURG ROTATE 9660 (MISCELLANEOUS) IMPLANT
BNDG GAUZE ELAST 4 BULKY (GAUZE/BANDAGES/DRESSINGS) ×2 IMPLANT
CANISTER SUCTION 2500CC (MISCELLANEOUS) ×3 IMPLANT
CHLORAPREP W/TINT 26ML (MISCELLANEOUS) IMPLANT
COVER SURGICAL LIGHT HANDLE (MISCELLANEOUS) ×3 IMPLANT
DRAPE LAPAROTOMY TRNSV 102X78 (DRAPE) ×3 IMPLANT
DRAPE UTILITY XL STRL (DRAPES) ×6 IMPLANT
ELECT CAUTERY BLADE 6.4 (BLADE) ×3 IMPLANT
ELECT REM PT RETURN 9FT ADLT (ELECTROSURGICAL) ×3
ELECTRODE REM PT RTRN 9FT ADLT (ELECTROSURGICAL) ×1 IMPLANT
GAUZE SPONGE 4X4 12PLY STRL (GAUZE/BANDAGES/DRESSINGS) ×3 IMPLANT
GLOVE BIO SURGEON STRL SZ 6 (GLOVE) ×3 IMPLANT
GLOVE BIOGEL PI IND STRL 6.5 (GLOVE) ×1 IMPLANT
GLOVE BIOGEL PI INDICATOR 6.5 (GLOVE) ×2
GOWN STRL REUS W/ TWL LRG LVL3 (GOWN DISPOSABLE) ×1 IMPLANT
GOWN STRL REUS W/TWL 2XL LVL3 (GOWN DISPOSABLE) ×6 IMPLANT
GOWN STRL REUS W/TWL LRG LVL3 (GOWN DISPOSABLE) ×3
KIT BASIN OR (CUSTOM PROCEDURE TRAY) ×3 IMPLANT
KIT ROOM TURNOVER OR (KITS) ×3 IMPLANT
NS IRRIG 1000ML POUR BTL (IV SOLUTION) ×3 IMPLANT
PACK SURGICAL SETUP 50X90 (CUSTOM PROCEDURE TRAY) ×3 IMPLANT
PAD ABD 8X10 STRL (GAUZE/BANDAGES/DRESSINGS) ×2 IMPLANT
PAD ARMBOARD 7.5X6 YLW CONV (MISCELLANEOUS) ×6 IMPLANT
PENCIL BUTTON HOLSTER BLD 10FT (ELECTRODE) ×3 IMPLANT
SPONGE GAUZE 4X4 12PLY STER LF (GAUZE/BANDAGES/DRESSINGS) ×2 IMPLANT
SPONGE LAP 18X18 X RAY DECT (DISPOSABLE) ×3 IMPLANT
SWAB COLLECTION DEVICE MRSA (MISCELLANEOUS) ×3 IMPLANT
SYR BULB IRRIGATION 50ML (SYRINGE) IMPLANT
TAPE CLOTH SURG 6X10 WHT LF (GAUZE/BANDAGES/DRESSINGS) ×2 IMPLANT
TOWEL OR 17X24 6PK STRL BLUE (TOWEL DISPOSABLE) ×3 IMPLANT
TOWEL OR 17X26 10 PK STRL BLUE (TOWEL DISPOSABLE) ×3 IMPLANT
TUBE ANAEROBIC SPECIMEN COL (MISCELLANEOUS) ×3 IMPLANT
TUBE CONNECTING 12'X1/4 (SUCTIONS) ×1
TUBE CONNECTING 12X1/4 (SUCTIONS) ×2 IMPLANT
WATER STERILE IRR 1000ML POUR (IV SOLUTION) ×3 IMPLANT
YANKAUER SUCT BULB TIP NO VENT (SUCTIONS) ×3 IMPLANT

## 2016-12-12 NOTE — ED Triage Notes (Signed)
Has had an infection in his groin and  Has swollen groin x 2 weeks saw a dr and given antiobiotics and he has been  Taking them but has gotten worse last 3 days

## 2016-12-12 NOTE — ED Provider Notes (Signed)
[facility]-EMERGENCY DEPT Provider Note   CSN: [id number] Arrival date & time: 12/12/16  1202     History   Chief Complaint Chief Complaint  Patient presents with  . Wound Infection  . Fever    HPI Jeffrey Murphy is a 61 y.o. male.  HPI  Patient presents with infection of his left groin. Has a last couple weeks. Saw his new primary care doctor around 6 days ago. Had antibiotics started that time. Reviewing the records shows that it was Keflex and Cipro. States it started with some urination problems. Now is been having fevers. Pain is increased. Pain and swelling is worse over last few days. Has had swelling along with some drainage. No change in his bowel habits. Reportedly had a hernia repair on that side previously.   Past Medical History:  Diagnosis Date  . Arthritis   . Diabetes mellitus without complication (HCC)   . Hypertension   . Wears glasses    reading    Patient Active Problem List   Diagnosis Date Noted  . Rupture of right quadriceps tendon 12/24/2014  . Quadriceps tendon rupture 12/24/2014    Past Surgical History:  Procedure Laterality Date  . COLONOSCOPY    . CYST REMOVAL NECK    . INGUINAL HERNIA REPAIR     right  . JOINT REPLACEMENT     right total knee  . PILONIDAL CYST EXCISION     back  . QUADRICEPS TENDON REPAIR Right 12/24/2014   Procedure: RIGHT QUADRICEP TENDON REPAIR ;  Surgeon: Cheral AlmasNaiping Michael Xu, MD;  Location: [facility];  Service: Orthopedics;  Laterality: Right;  . REPLACEMENT TOTAL KNEE         Home Medications    Prior to Admission medications   Medication Sig Start Date End Date Taking? Authorizing Provider  acetaminophen (TYLENOL) 325 MG tablet Take 650 mg by mouth every 6 (six) hours as needed for headache (headache).   Yes Historical Provider, MD  ALPRAZolam Prudy Feeler(XANAX) 0.5 MG tablet Take 0.5 mg by mouth 3 (three) times daily. 02/18/15  Yes Historical Provider, MD  aspirin EC 325 MG tablet Take 325 mg by mouth  daily.   Yes Historical Provider, MD  ciprofloxacin (CIPRO) 500 MG tablet Take 1 tablet by mouth 2 (two) times daily. 12/07/16  Yes Historical Provider, MD  lisinopril (PRINIVIL,ZESTRIL) 10 MG tablet Take 1 tablet (10 mg total) by mouth daily. 11/19/16  Yes Mancel BaleElliott Wentz, MD  metFORMIN (GLUCOPHAGE) 1000 MG tablet Take 1 tablet (1,000 mg total) by mouth 2 (two) times daily. 11/19/16  Yes Mancel BaleElliott Wentz, MD  cyclobenzaprine (FLEXERIL) 10 MG tablet  01/01/15   Historical Provider, MD  hydrOXYzine (ATARAX/VISTARIL) 25 MG tablet Take 1 tablet (25 mg total) by mouth every 6 (six) hours as needed for itching. Patient not taking: Reported on 12/12/2016 04/05/15   Charm RingsErin J Honig, MD  predniSONE (DELTASONE) 20 MG tablet Take 3 pills daily for 4 days, then 2 pills daily for 4 days, then 1 pill daily for 4 days. Patient not taking: Reported on 12/12/2016 04/05/15   Charm RingsErin J Honig, MD  traZODone (DESYREL) 50 MG tablet Take 50-100 mg by mouth at bedtime. 04/21/16   Historical Provider, MD    Family History No family history on file.  Social History Social History  Substance Use Topics  . Smoking status: Never Smoker  . Smokeless tobacco: Never Used  . Alcohol use Yes     Allergies   Patient has no known allergies.  Review of Systems Review of Systems  Constitutional: Positive for fever.  HENT: Negative for congestion.   Eyes: Negative for pain.  Respiratory: Negative for cough.   Gastrointestinal: Positive for abdominal pain.  Genitourinary: Negative for flank pain.  Skin: Positive for wound.  Neurological: Negative for seizures.  Hematological: Negative for adenopathy.  Psychiatric/Behavioral: Negative for confusion.     Physical Exam Updated Vital Signs BP 109/70   Pulse 102   Temp 99.8 F (37.7 C) (Oral)   Resp 17   SpO2 100%   Physical Exam  Constitutional: He appears well-developed.  HENT:  Head: Normocephalic.  Patient appears uncomfortable.  Eyes: EOM are normal.  Neck: Neck  supple.  Cardiovascular:  Mild tachycardia  Pulmonary/Chest: Effort normal.  Abdominal:  Left inguinal area is swollen and inflamed erythematous and tender. There is approximately 1 cm center fluctuant area with some bloody drainage. The swelling goes to the suprapubic area. There is some erythema down the scrotum but no frank induration or swelling. His penis is no longer visible. States he has been able to urinate.  Musculoskeletal: He exhibits no edema.  Neurological: He is alert.  Skin: Skin is warm. Capillary refill takes less than 2 seconds.     ED Treatments / Results  Labs (all labs ordered are listed, but only abnormal results are displayed) Labs Reviewed  COMPREHENSIVE METABOLIC PANEL - Abnormal; Notable for the following:       Result Value   Sodium 127 (*)    Chloride 89 (*)    Glucose, Bld 417 (*)    Albumin 2.6 (*)    ALT 11 (*)    All other components within normal limits  CBC WITH DIFFERENTIAL/PLATELET - Abnormal; Notable for the following:    WBC 15.8 (*)    Neutro Abs 13.8 (*)    All other components within normal limits  URINALYSIS, ROUTINE W REFLEX MICROSCOPIC - Abnormal; Notable for the following:    Specific Gravity, Urine 1.033 (*)    Glucose, UA >=500 (*)    Bacteria, UA RARE (*)    Squamous Epithelial / LPF 0-5 (*)    All other components within normal limits  I-STAT CG4 LACTIC ACID, ED - Abnormal; Notable for the following:    Lactic Acid, Venous 2.96 (*)    All other components within normal limits  I-STAT CHEM 8, ED - Abnormal; Notable for the following:    Sodium 126 (*)    Chloride 89 (*)    BUN 24 (*)    Glucose, Bld 438 (*)    Calcium, Ion 1.01 (*)    All other components within normal limits  CULTURE, BLOOD (ROUTINE X 2)  CULTURE, BLOOD (ROUTINE X 2)  URINE CULTURE  PROTIME-INR  HIV ANTIBODY (ROUTINE TESTING)  CBG MONITORING, ED  I-STAT CG4 LACTIC ACID, ED    EKG  EKG Interpretation None       Radiology Dg Chest 2  View  Result Date: 12/12/2016 CLINICAL DATA:  Fever. EXAM: CHEST  2 VIEW COMPARISON:  Radiographs of August 11, 2015. FINDINGS: The heart size and mediastinal contours are within normal limits. Both lungs are clear. No pneumothorax or pleural effusion is noted. The visualized skeletal structures are unremarkable. IMPRESSION: No active cardiopulmonary disease. Electronically Signed   By: Lupita Raider, M.D.   On: 12/12/2016 14:52   Ct Pelvis W Contrast  Result Date: 12/12/2016 CLINICAL DATA:  Groin abscess, swelling for 1-2 weeks.  Pain. EXAM: CT PELVIS WITH CONTRAST  TECHNIQUE: Multidetector CT imaging of the pelvis was performed using the standard protocol following the bolus administration of intravenous contrast. CONTRAST:  ISOVUE-300 IOPAMIDOL (ISOVUE-300) INJECTION 61% COMPARISON:  None. FINDINGS: Urinary Tract: Area of scarring and cortical thinning in the visualized right kidney lower pole. Ureters are decompressed. Urinary bladder unremarkable. Bowel: Visualized large and small bowel unremarkable. Moderate stool in the rectosigmoid colon. Vascular/Lymphatic: Aorta and iliac vessels are normal caliber. Reproductive:  No visible focal abnormality. Other: Large complex fluid collection noted in the left inguinal region and extending into the left side of the scrotum measures 10.0 x 8.9 x 5.6 cm, compatible with abscess. Surrounding stranding within the subcutaneous soft tissues suggesting cellulitis. Moderate size right inguinal hernia containing fat. No free fluid or free air in the pelvis. Musculoskeletal: No acute bony abnormality. IMPRESSION: Large complex fluid collection in the left inguinal region and extending into the left side of the scrotum most compatible with large abscess. Surrounding inflammatory stranding in the subcutaneous soft tissues suggests cellulitis. Right inguinal hernia. Moderate stool in the rectosigmoid colon. Electronically Signed   By: Charlett Nose M.D.   On: 12/12/2016  14:36    Procedures Procedures (including critical care time)  Medications Ordered in ED Medications  vancomycin (VANCOCIN) 2,000 mg in sodium chloride 0.9 % 500 mL IVPB (2,000 mg Intravenous New Bag/Given 12/12/16 1411)  vancomycin (VANCOCIN) IVPB 1000 mg/200 mL premix (not administered)  sodium chloride 0.9 % bolus 1,000 mL (not administered)  sodium chloride 0.9 % bolus 1,000 mL (0 mLs Intravenous Stopped 12/12/16 1450)  HYDROmorphone (DILAUDID) injection 0.5 mg (0.5 mg Intravenous Given 12/12/16 1512)  sodium chloride 0.9 % bolus 1,000 mL (0 mLs Intravenous Stopped 12/12/16 1506)  iopamidol (ISOVUE-300) 61 % injection (100 mLs  Contrast Given 12/12/16 1422)     Initial Impression / Assessment and Plan / ED Course  I have reviewed the triage vital signs and the nursing notes.  Pertinent labs & imaging results that were available during my care of the patient were reviewed by me and considered in my medical decision making (see chart for details).     A shared with reported fevers inguinal abscess. Has been on antibiotics of Cipro and Keflex. Questionable compliance with his medications however. Found to have sugar of 400. Has large left-sided groin abscess. Some involvement of the scrotum. Discussed with general surgery who will see the patient. They requested admission to internal medicine.  Final Clinical Impressions(s) / ED Diagnoses   Final diagnoses:  Inguinal abscess  Hyperglycemia    New Prescriptions New Prescriptions   No medications on file     Benjiman Core, MD 12/12/16 1559

## 2016-12-12 NOTE — ED Notes (Signed)
ED Provider at bedside. 

## 2016-12-12 NOTE — ED Notes (Signed)
Pt CBG was 262, notified Casey(RN)

## 2016-12-12 NOTE — Interval H&P Note (Signed)
History and Physical Interval Note:  12/12/2016 8:22 PM  Jeffrey Murphy  has presented today for surgery, with the diagnosis of LEFT INGUINAL ABSCESS  The various methods of treatment have been discussed with the patient and family. After consideration of risks, benefits and other options for treatment, the patient has consented to  Procedure(s): IRRIGATION AND DEBRIDEMENT ABSCESS (Left) as a surgical intervention .  The patient's history has been reviewed, patient examined, no change in status, stable for surgery.  I have reviewed the patient's chart and labs.  Questions were answered to the patient's satisfaction.     [name]

## 2016-12-12 NOTE — Transfer of Care (Signed)
Immediate Anesthesia Transfer of Care Note  Patient: Jeffrey Murphy  Procedure(s) Performed: Procedure(s): IRRIGATION AND DEBRIDEMENT ABSCESS (Left)  Patient Location: PACU  Anesthesia Type:General  Level of Consciousness: awake, alert , oriented and patient cooperative  Airway & Oxygen Therapy: Patient Spontanous Breathing and Patient connected to nasal cannula oxygen  Post-op Assessment: Report given to RN and Post -op Vital signs reviewed and stable  Post vital signs: Reviewed and stable  Last Vitals:  Vitals:   12/12/16 2007 12/12/16 2136  BP: 114/70 127/80  Pulse: (!) 111 (!) 125  Resp: 18 (!) 25  Temp: 37.6 C 36.7 C    Last Pain:  Vitals:   12/12/16 2136  TempSrc:   PainSc: Asleep         Complications: No apparent anesthesia complications

## 2016-12-12 NOTE — Anesthesia Procedure Notes (Signed)
Procedure Name: Intubation Date/Time: 12/12/2016 8:56 PM Performed by: Zorita Pang Pre-anesthesia Checklist: Patient identified, Emergency Drugs available, Suction available and Patient being monitored Patient Re-evaluated:Patient Re-evaluated prior to inductionOxygen Delivery Method: Circle System Utilized Preoxygenation: Pre-oxygenation with 100% oxygen Intubation Type: IV induction, Rapid sequence and Cricoid Pressure applied Ventilation: Mask ventilation without difficulty Laryngoscope Size: Mac and 4 Grade View: Grade I Tube type: Oral Number of attempts: 1 Airway Equipment and Method: Stylet and Oral airway Placement Confirmation: ETT inserted through vocal cords under direct vision,  positive ETCO2 and breath sounds checked- equal and bilateral Secured at: 23 cm Tube secured with: Tape Dental Injury: Teeth and Oropharynx as per pre-operative assessment

## 2016-12-12 NOTE — Consult Note (Signed)
[facility] Consult/Admission Note  Jeffrey Murphy January 17, 1956  [id number].    Requesting MD: Dr. Alvino Chapel Chief Complaint/Reason for Consult: Groin abscess  HPI:  Patient is a 61 year old male with a history of arthritis, hypertension, poorly controlled diabetes who presents to the emergency department with complaints of mass in his left groin. Patient states roughly 1 week ago he noticed a small boil on his scrotum that drained and went away. Roughly 3-5 days ago he noticed increased swelling and pain in his left groin area. He states constant pain, worse with touch, nonradiating pain, severe, nothing makes it better. Associated redness, subjective fevers, chills. He states he saw his primary care provider and received an antibiotic for this. He has taking antibiotic for 3 days without relief. Patient states for the last 3 weeks he has had issues with incontinence. Patient has a new sexual partner for roughly 1 month. Patient states he does not have pain with urination but when the urine touches the scrotal area it causes pain. Patient denies penile discharge, abdominal pain, nausea, vomiting, chest pain, shortness of breath, dizziness, headache.  ED course: Labs: Glucose 438, sodium 126, chloride 89, BUN 24, WBC 15.8 CT revealed large complex fluid collection in the left inguinal region and extending into the left side of the scrotum most compatible with large abscess. Surrounding inflammatory stranding in the subcutaneous soft tissue suggest cellulitis. Right inguinal hernia   ROS:  Review of Systems  Constitutional: Positive for chills and fever.  HENT: Negative for sore throat.   Eyes: Negative for discharge.  Respiratory: Negative for shortness of breath.   Cardiovascular: Negative for chest pain.  Gastrointestinal: Negative for abdominal pain, nausea and vomiting.  Genitourinary: Positive for dysuria. Negative for urgency.       Intermittent incontinence Inguinal  abscess/infection   Neurological: Negative for dizziness, loss of consciousness and weakness.  All other systems reviewed and are negative.    No family history on file.  Past Medical History:  Diagnosis Date  . Arthritis   . Diabetes mellitus without complication ([place])   . Hypertension   . Wears glasses    reading    Past Surgical History:  Procedure Laterality Date  . COLONOSCOPY    . CYST REMOVAL NECK    . INGUINAL HERNIA REPAIR     right  . JOINT REPLACEMENT     right total knee  . PILONIDAL CYST EXCISION     back  . QUADRICEPS TENDON REPAIR Right 12/24/2014   Procedure: RIGHT QUADRICEP TENDON REPAIR ;  Surgeon: Marianna Payment, MD;  Location: [facility];  Service: Orthopedics;  Laterality: Right;  . REPLACEMENT TOTAL KNEE      Social History:  reports that he has never smoked. He has never used smokeless tobacco. He reports that he drinks alcohol. He reports that he uses drugs, including Marijuana.  Allergies: No Known Allergies   (Not in a hospital admission)  Blood pressure 109/70, pulse 102, temperature 99.8 F (37.7 C), temperature source Oral, resp. rate 17, SpO2 100 %.  Physical Exam  Constitutional: He is oriented to person, place, and time and well-developed, well-nourished, and in no distress. No distress.  Appears older than stated age, well appearing  HENT:  Head: Normocephalic and atraumatic.  Eyes: Conjunctivae are normal. Right eye exhibits no discharge. Left eye exhibits no discharge.  Neck: Normal range of motion. Neck supple.  Pulmonary/Chest:  Rate and effort normal  Abdominal: Soft. Normal appearance. He exhibits no  distension. There is no tenderness. There is no rebound and no guarding.  Genitourinary:  Genitourinary Comments: Chaperone present for exam: Uncircumcised penis, no redness, swelling or tenderness noted to penis Redness noted to scrotum with scaly macerated skin noted to left side, no abscess noted to  scrotum, no tenderness noted to lower scrotum, moderate TTP and swelling noted to superior left aspect of scrotal sac is inguinal canal Large area of swelling, erythema and pain noted to left inguinal region with roughly 3 x 3 cm area of necrotic tissue with some purulent drainage noted  Musculoskeletal: Normal range of motion.  Neurological: He is alert and oriented to person, place, and time.  Skin: Skin is warm and dry. He is not diaphoretic.  Psychiatric: Mood and affect normal.  Nursing note and vitals reviewed.   Left inguinal region  Results for orders placed or performed during the hospital encounter of 12/12/16 (from the past 48 hour(s))  Comprehensive metabolic panel     Status: Abnormal   Collection Time: 12/12/16  1:18 PM  Result Value Ref Range   Sodium 127 (L) 135 - 145 mmol/L   Potassium 4.3 3.5 - 5.1 mmol/L   Chloride 89 (L) 101 - 111 mmol/L   CO2 24 22 - 32 mmol/L   Glucose, Bld 417 (H) 65 - 99 mg/dL   BUN 16 6 - 20 mg/dL   Creatinine, Ser 0.82 0.61 - 1.24 mg/dL   Calcium 8.9 8.9 - 10.3 mg/dL   Total Protein 7.8 6.5 - 8.1 g/dL   Albumin 2.6 (L) 3.5 - 5.0 g/dL   AST 16 15 - 41 U/L   ALT 11 (L) 17 - 63 U/L   Alkaline Phosphatase 85 38 - 126 U/L   Total Bilirubin 0.6 0.3 - 1.2 mg/dL   GFR calc non Af Amer >60 >60 mL/min   GFR calc Af Amer >60 >60 mL/min    Comment: (NOTE) The eGFR has been calculated using the CKD EPI equation. This calculation has not been validated in all clinical situations. eGFR's persistently <60 mL/min signify possible Chronic Kidney Disease.    Anion gap 14 5 - 15  CBC with Differential     Status: Abnormal   Collection Time: 12/12/16  1:18 PM  Result Value Ref Range   WBC 15.8 (H) 4.0 - 10.5 K/uL   RBC 4.81 4.22 - 5.81 MIL/uL   Hemoglobin 13.8 13.0 - 17.0 g/dL   HCT 40.4 39.0 - 52.0 %   MCV 84.0 78.0 - 100.0 fL   MCH 28.7 26.0 - 34.0 pg   MCHC 34.2 30.0 - 36.0 g/dL   RDW 12.7 11.5 - 15.5 %   Platelets 271 150 - 400 K/uL    Neutrophils Relative % 87 %   Neutro Abs 13.8 (H) 1.7 - 7.7 K/uL   Lymphocytes Relative 7 %   Lymphs Abs 1.1 0.7 - 4.0 K/uL   Monocytes Relative 5 %   Monocytes Absolute 0.8 0.1 - 1.0 K/uL   Eosinophils Relative 1 %   Eosinophils Absolute 0.1 0.0 - 0.7 K/uL   Basophils Relative 0 %   Basophils Absolute 0.0 0.0 - 0.1 K/uL  Protime-INR     Status: None   Collection Time: 12/12/16  1:18 PM  Result Value Ref Range   Prothrombin Time 14.9 11.4 - 15.2 seconds   INR 1.16   I-Stat CG4 Lactic Acid, ED     Status: Abnormal   Collection Time: 12/12/16  1:45 PM  Result Value  Ref Range   Lactic Acid, Venous 2.96 (HH) 0.5 - 1.9 mmol/L   Comment NOTIFIED PHYSICIAN   I-stat Chem 8, ED     Status: Abnormal   Collection Time: 12/12/16  1:46 PM  Result Value Ref Range   Sodium 126 (L) 135 - 145 mmol/L   Potassium 5.1 3.5 - 5.1 mmol/L   Chloride 89 (L) 101 - 111 mmol/L   BUN 24 (H) 6 - 20 mg/dL   Creatinine, Ser 0.70 0.61 - 1.24 mg/dL   Glucose, Bld 438 (H) 65 - 99 mg/dL   Calcium, Ion 1.01 (L) 1.15 - 1.40 mmol/L   TCO2 30 0 - 100 mmol/L   Hemoglobin 15.0 13.0 - 17.0 g/dL   HCT 44.0 39.0 - 52.0 %  Urinalysis, Routine w reflex microscopic     Status: Abnormal   Collection Time: 12/12/16  2:18 PM  Result Value Ref Range   Color, Urine YELLOW YELLOW   APPearance CLEAR CLEAR   Specific Gravity, Urine 1.033 (H) 1.005 - 1.030   pH 6.0 5.0 - 8.0   Glucose, UA >=500 (A) NEGATIVE mg/dL   Hgb urine dipstick NEGATIVE NEGATIVE   Bilirubin Urine NEGATIVE NEGATIVE   Ketones, ur NEGATIVE NEGATIVE mg/dL   Protein, ur NEGATIVE NEGATIVE mg/dL   Nitrite NEGATIVE NEGATIVE   Leukocytes, UA NEGATIVE NEGATIVE   RBC / HPF 0-5 0 - 5 RBC/hpf   WBC, UA 0-5 0 - 5 WBC/hpf   Bacteria, UA RARE (A) NONE SEEN   Squamous Epithelial / LPF 0-5 (A) NONE SEEN   Dg Chest 2 View  Result Date: 12/12/2016 CLINICAL DATA:  Fever. EXAM: CHEST  2 VIEW COMPARISON:  Radiographs of August 11, 2015. FINDINGS: The heart size and  mediastinal contours are within normal limits. Both lungs are clear. No pneumothorax or pleural effusion is noted. The visualized skeletal structures are unremarkable. IMPRESSION: No active cardiopulmonary disease. Electronically Signed   By: Marijo Conception, M.D.   On: 12/12/2016 14:52   Ct Pelvis W Contrast  Result Date: 12/12/2016 CLINICAL DATA:  Groin abscess, swelling for 1-2 weeks.  Pain. EXAM: CT PELVIS WITH CONTRAST TECHNIQUE: Multidetector CT imaging of the pelvis was performed using the standard protocol following the bolus administration of intravenous contrast. CONTRAST:  129m ISOVUE-300 IOPAMIDOL (ISOVUE-300) INJECTION 61% COMPARISON:  None. FINDINGS: Urinary Tract: Area of scarring and cortical thinning in the visualized right kidney lower pole. Ureters are decompressed. Urinary bladder unremarkable. Bowel: Visualized large and small bowel unremarkable. Moderate stool in the rectosigmoid colon. Vascular/Lymphatic: Aorta and iliac vessels are normal caliber. Reproductive:  No visible focal abnormality. Other: Large complex fluid collection noted in the left inguinal region and extending into the left side of the scrotum measures 10.0 x 8.9 x 5.6 cm, compatible with abscess. Surrounding stranding within the subcutaneous soft tissues suggesting cellulitis. Moderate size right inguinal hernia containing fat. No free fluid or free air in the pelvis. Musculoskeletal: No acute bony abnormality. IMPRESSION: Large complex fluid collection in the left inguinal region and extending into the left side of the scrotum most compatible with large abscess. Surrounding inflammatory stranding in the subcutaneous soft tissues suggests cellulitis. Right inguinal hernia. Moderate stool in the rectosigmoid colon. Electronically Signed   By: KRolm BaptiseM.D.   On: 12/12/2016 14:36      Assessment/Plan Admit to medicine  Problem list Hypertension Poorly controlled diabetes  Inguinal abscess/cellulitis - CT  scan revealed large complex fluid collection in the left inguinal region and extending  into the left side of the scrotum most compatible with large abscess. Surrounding inflammatory stranding in the subcutaneous soft tissues suggests cellulitis. Right inguinal hernia. - Dr. Barry Dienes will take patient to OR this evening for I&D - IV antibiotics per primary - Nothing by mouth - Diabetes control her primary  Thank you for the consult. We will continue to follow this patient.  Kalman Drape, [facility] 12/12/2016, 3:43 PM Pager: [phone] Consults: [phone] Mon-Fri 7:00 am-4:30 pm Sat-Sun 7:00 am-11:30 am

## 2016-12-12 NOTE — ED Notes (Signed)
This RN just witnessed Armed forces technical officerChrislyn RN given 1mg  of Dilaudid but the scanner did scan the Mx.  Mx was placed in sharps before mistake was known.

## 2016-12-12 NOTE — Anesthesia Postprocedure Evaluation (Signed)
Anesthesia Post Note  Patient: Jeffrey Murphy  Procedure(s) Performed: Procedure(s) (LRB): IRRIGATION AND DEBRIDEMENT ABSCESS (Left)  Patient location during evaluation: PACU Anesthesia Type: General Level of consciousness: awake and alert, oriented and patient cooperative Pain management: pain level controlled Vital Signs Assessment: post-procedure vital signs reviewed and stable Respiratory status: spontaneous breathing, nonlabored ventilation and respiratory function stable Cardiovascular status: blood pressure returned to baseline and stable Postop Assessment: no signs of nausea or vomiting Anesthetic complications: no       Last Vitals:  Vitals:   12/12/16 2210 12/12/16 2215  BP:  101/62  Pulse: (!) 112   Resp: 13   Temp:  36.1 C    Last Pain:  Vitals:   12/12/16 2215  TempSrc:   PainSc: 0-No pain                 Caylen Kuwahara,E. Kiefer Opheim

## 2016-12-12 NOTE — ED Notes (Signed)
Surgery PA at bedside.  

## 2016-12-12 NOTE — Op Note (Signed)
Incision and Drainage Left inguinal abscess  Pre-operative Diagnosis: left inguinal abscess   Post-operative Diagnosis: same   Indications: severe pain and swelling, redness left groin.  CT with abscess.    Surgeon:  Almond LintFaera Morell Mears, MD  Anesthesia: General   Procedure Details  The procedure, risks and complications have been discussed in detail (including, infection, bleeding, need for additional procedures) with the patient, and the patient has signed consent to the procedure. The patient was informed that the wound would be left open.  The patient was taken to OR 8 and general anesthesia was induced. The patient was placed into supine position.  The skin was sterilely prepped and draped over the affected area in the usual fashion. Time out was performed according to the surgical safety checklist.   I&D with a #15 blade was performed on the left inguinal region.  Cultures were sent. Additional skin was debrided around the opening where the skin was dusky and thin. Copious purulent drainage was present. The pulse lavage was used to irrigate the cavity. The wound was packed with betadine soaked kerlix.  The patient was awakened from anesthesia and taken to the PACU in stable condition.   Findings:  Copious pus tracking into anterior thigh and up onto abdominal wall.  Could not find tracking into scrotum  EBL: minimal blood, lots of pus  Drains: None   Condition: Tolerated procedure well   Complications:  none known.

## 2016-12-12 NOTE — Anesthesia Preprocedure Evaluation (Signed)
Anesthesia Evaluation  Patient identified by MRN, date of birth, ID band Patient awake    Reviewed: Allergy & Precautions, NPO status , Patient's Chart, lab work & pertinent test results  History of Anesthesia Complications Negative for: history of anesthetic complications  Airway Mallampati: II  TM Distance: >3 FB Neck ROM: Full    Dental  (+) Dental Advisory Given   Pulmonary neg pulmonary ROS,    breath sounds clear to auscultation       Cardiovascular hypertension, Pt. on medications (-) angina Rhythm:Regular Rate:Normal     Neuro/Psych negative neurological ROS     GI/Hepatic GERD  Controlled,(+)     substance abuse  alcohol use, cocaine use and marijuana use,   Endo/Other  diabetes (glu 211), Oral Hypoglycemic AgentsMorbid obesity  Renal/GU negative Renal ROS     Musculoskeletal   Abdominal (+) + obese,   Peds  Hematology   Anesthesia Other Findings   Reproductive/Obstetrics                             Anesthesia Physical Anesthesia Plan  ASA: III  Anesthesia Plan: General   Post-op Pain Management:    Induction: Intravenous  Airway Management Planned: Oral ETT  Additional Equipment:   Intra-op Plan:   Post-operative Plan: Extubation in OR  Informed Consent: I have reviewed the patients History and Physical, chart, labs and discussed the procedure including the risks, benefits and alternatives for the proposed anesthesia with the patient or authorized representative who has indicated his/her understanding and acceptance.   Dental advisory given  Plan Discussed with: CRNA and Surgeon  Anesthesia Plan Comments: (Plan routine monitors, GETA)        Anesthesia Quick Evaluation

## 2016-12-12 NOTE — Progress Notes (Signed)
Pharmacy Antibiotic Note  Particia JasperJoe A Amberg is a 61 y.o. male admitted on 12/12/2016 with cellulitis.  Pharmacy has been consulted for vancomycin dosing. Tmax is 99.8 and WBC is elevated at 15.8. SCr is WNL at 0.7. Lactic acid is elevated at 2.96.   Plan: Vancomycin 2gm IV x 1 then 1gm IV Q12H F/u renal fxn, C&S, clinical status and trough at SS    Temp (24hrs), Avg:99.8 F (37.7 C), Min:99.8 F (37.7 C), Max:99.8 F (37.7 C)   Recent Labs Lab 12/12/16 1318 12/12/16 1345 12/12/16 1346  WBC 15.8*  --   --   CREATININE  --   --  0.70  LATICACIDVEN  --  2.96*  --     CrCl cannot be calculated (Unknown ideal weight.).    No Known Allergies  Antimicrobials this admission: Vanc 2/5>>  Dose adjustments this admission: N/A  Microbiology results: Pending  Thank you for allowing pharmacy to be a part of this patient's care.  Holden Draughon, Drake LeachRachel Lynn 12/12/2016 1:58 PM

## 2016-12-12 NOTE — ED Notes (Signed)
Patient transported to CT 

## 2016-12-12 NOTE — H&P (Signed)
History and Physical    Jeffrey Murphy ZOX:[id number] DOB: [birth year] DOA: 12/12/2016   PCP: Delorse Lek, MD   Patient coming from:  Home    Chief Complaint: Scrotal swelling  and pain   HPI: Jeffrey Murphy is a 61 y.o. male  DM, Hypertension , HLD, arthritis, presenting to the emergency department with 1 week history of increasing left groin mass, initially described as a small boil on the scrotum, which he drained, but then returned about 3 to 5 days ago. At the time he noticed increased swelling and pain in the left groin, constant, worse withtouch, nonradiating, severe, associated redness, subjective fever or chills. The patient states seeing his PCP, receiving Cipro for this, taking it for 3 days without relief. He denies any pain with urination, but he does have sensitivity in the area due to the scrotal inflammation. He denies any penile discharge. He denies any abdominal pain nausea or vomiting. He denies any chest pain or shortness of breath. No dizziness or headaches. He denies any chest pain or palpitations. No shortness of breath or cough. He does have unprotected casual  sex.  ED Course:  BP 110/75   Pulse 94   Temp 100.1 F (37.8 C) (Rectal)   Resp 16   Wt 96.2 kg (212 lb)   SpO2 100%   BMI 31.54 kg/m    CT scan revealed large complex fluid collection in the left inguinal region and extending into the left side of the scrotum most compatible with same Started on antibiotics with Vanco IV q 12.  WBC 15.8. LA 2.96 ->1.27  .  Hb 15.  Cr 0.7   Review of Systems: As per HPI otherwise 10 point review of systems negative.   Past Medical History:  Diagnosis Date  . Arthritis   . Diabetes mellitus without complication (HCC)   . Hypertension   . Wears glasses    reading    Past Surgical History:  Procedure Laterality Date  . COLONOSCOPY    . CYST REMOVAL NECK    . INGUINAL HERNIA REPAIR     right  . JOINT REPLACEMENT     right total knee  . PILONIDAL CYST EXCISION      back  . QUADRICEPS TENDON REPAIR Right 12/24/2014   Procedure: RIGHT QUADRICEP TENDON REPAIR ;  Surgeon: Cheral Almas, MD;  Location: [facility];  Service: Orthopedics;  Laterality: Right;  . REPLACEMENT TOTAL KNEE      Social History Social History   Social History  . Marital status: Married    Spouse name: N/A  . Number of children: N/A  . Years of education: N/A   Occupational History  . unemployed    Social History Main Topics  . Smoking status: Never Smoker  . Smokeless tobacco: Never Used  . Alcohol use Yes  . Drug use: Yes    Frequency: 5.0 times per week    Types: Marijuana, Cocaine, "Crack" cocaine     Comment: last used yesterday   . Sexual activity: Yes    Partners: Female    Birth control/ protection: None   Other Topics Concern  . Not on file   Social History Narrative  . No narrative on file     No Known Allergies  History reviewed. No pertinent family history.    Prior to Admission medications   Medication Sig Start Date End Date Taking? Authorizing Provider  acetaminophen (TYLENOL) 325 MG tablet Take 650 mg by mouth  every 6 (six) hours as needed for headache (headache).   Yes Historical Provider, MD  ALPRAZolam Prudy Feeler) 0.5 MG tablet Take 0.5 mg by mouth 3 (three) times daily. 02/18/15  Yes Historical Provider, MD  aspirin EC 325 MG tablet Take 325 mg by mouth daily.   Yes Historical Provider, MD  ciprofloxacin (CIPRO) 500 MG tablet Take 1 tablet by mouth 2 (two) times daily. 12/07/16  Yes Historical Provider, MD  lisinopril (PRINIVIL,ZESTRIL) 10 MG tablet Take 1 tablet (10 mg total) by mouth daily. 11/19/16  Yes Mancel Bale, MD  metFORMIN (GLUCOPHAGE) 1000 MG tablet Take 1 tablet (1,000 mg total) by mouth 2 (two) times daily. 11/19/16  Yes Mancel Bale, MD  cyclobenzaprine (FLEXERIL) 10 MG tablet  01/01/15   Historical Provider, MD  hydrOXYzine (ATARAX/VISTARIL) 25 MG tablet Take 1 tablet (25 mg total) by mouth every 6 (six)  hours as needed for itching. Patient not taking: Reported on 12/12/2016 04/05/15   Charm Rings, MD  predniSONE (DELTASONE) 20 MG tablet Take 3 pills daily for 4 days, then 2 pills daily for 4 days, then 1 pill daily for 4 days. Patient not taking: Reported on 12/12/2016 04/05/15   Charm Rings, MD  traZODone (DESYREL) 50 MG tablet Take 50-100 mg by mouth at bedtime. 04/21/16   Historical Provider, MD    Physical Exam:  Vitals:   12/12/16 1630 12/12/16 1700 12/12/16 1709 12/12/16 1710  BP: 97/67 110/75    Pulse: 98 94    Resp: 16 16    Temp:   100.1 F (37.8 C)   TempSrc:   Rectal   SpO2: 97% 100%    Weight:    96.2 kg (212 lb)   Constitutional: NAD,anxious, tearful  uncomfortable due to scrotal pain  Eyes: PERRL, lids and conjunctivae normal ENMT: Mucous membranes are moist, without exudate or lesions  Neck: normal, supple, no masses, no thyromegaly Respiratory: clear to auscultation bilaterally, no wheezing, no crackles. Normal respiratory effort  Cardiovascular: tachy rate and rhythm, no murmurs / rubs / gallops. No extremity edema. 2+ pedal pulses. No carotid bruits.  Abdomen: Soft, non tender, No hepatosplenomegaly. Bowel sounds positive.  GU: Uncircumcised penis without rednessor discharge, or other lesions. Left scrotum swollen, red, with a maceration in the skin until you are really, tender to palpation, extending to the inguinal canal measuring around 10 cm.   Musculoskeletal: no clubbing / cyanosis. Moves all extremities Skin: no jaundice, No lesions.  Neurologic: Sensation intact  Strength equal in all extremities  Psychiatric:   Alert and oriented x 3. Anxious  mood.     Labs on Admission: I have personally reviewed following labs and imaging studies  CBC:  Recent Labs Lab 12/12/16 1318 12/12/16 1346  WBC 15.8*  --   NEUTROABS 13.8*  --   HGB 13.8 15.0  HCT 40.4 44.0  MCV 84.0  --   PLT 271  --     Basic Metabolic Panel:  Recent Labs Lab 12/12/16 1318  12/12/16 1346  NA 127* 126*  K 4.3 5.1  CL 89* 89*  CO2 24  --   GLUCOSE 417* 438*  BUN 16 24*  CREATININE 0.82 0.70  CALCIUM 8.9  --     GFR: CrCl cannot be calculated (Unknown ideal weight.).  Liver Function Tests:  Recent Labs Lab 12/12/16 1318  AST 16  ALT 11*  ALKPHOS 85  BILITOT 0.6  PROT 7.8  ALBUMIN 2.6*   No results for input(s): LIPASE, AMYLASE  in the last 168 hours. No results for input(s): AMMONIA in the last 168 hours.  Coagulation Profile:  Recent Labs Lab 12/12/16 1318  INR 1.16    Cardiac Enzymes: No results for input(s): CKTOTAL, CKMB, CKMBINDEX, TROPONINI in the last 168 hours.  BNP (last 3 results) No results for input(s): PROBNP in the last 8760 hours.  HbA1C: No results for input(s): HGBA1C in the last 72 hours.  CBG:  Recent Labs Lab 12/12/16 1615  GLUCAP 262*    Lipid Profile: No results for input(s): CHOL, HDL, LDLCALC, TRIG, CHOLHDL, LDLDIRECT in the last 72 hours.  Thyroid Function Tests: No results for input(s): TSH, T4TOTAL, FREET4, T3FREE, THYROIDAB in the last 72 hours.  Anemia Panel: No results for input(s): VITAMINB12, FOLATE, FERRITIN, TIBC, IRON, RETICCTPCT in the last 72 hours.  Urine analysis:    Component Value Date/Time   COLORURINE YELLOW 12/12/2016 1418   APPEARANCEUR CLEAR 12/12/2016 1418   LABSPEC 1.033 (H) 12/12/2016 1418   PHURINE 6.0 12/12/2016 1418   GLUCOSEU >=500 (A) 12/12/2016 1418   HGBUR NEGATIVE 12/12/2016 1418   BILIRUBINUR NEGATIVE 12/12/2016 1418   KETONESUR NEGATIVE 12/12/2016 1418   PROTEINUR NEGATIVE 12/12/2016 1418   NITRITE NEGATIVE 12/12/2016 1418   LEUKOCYTESUR NEGATIVE 12/12/2016 1418    Sepsis Labs: @LABRCNTIP (procalcitonin:4,lacticidven:4) )No results found for this or any previous visit (from the past 240 hour(s)).   Radiological Exams on Admission: Dg Chest 2 View  Result Date: 12/12/2016 CLINICAL DATA:  Fever. EXAM: CHEST  2 VIEW COMPARISON:  Radiographs of August 11, 2015. FINDINGS: The heart size and mediastinal contours are within normal limits. Both lungs are clear. No pneumothorax or pleural effusion is noted. The visualized skeletal structures are unremarkable. IMPRESSION: No active cardiopulmonary disease. Electronically Signed   By: Lupita Raider, M.D.   On: 12/12/2016 14:52   Ct Pelvis W Contrast  Result Date: 12/12/2016 CLINICAL DATA:  Groin abscess, swelling for 1-2 weeks.  Pain. EXAM: CT PELVIS WITH CONTRAST TECHNIQUE: Multidetector CT imaging of the pelvis was performed using the standard protocol following the bolus administration of intravenous contrast. CONTRAST:  ISOVUE-300 IOPAMIDOL (ISOVUE-300) INJECTION 61% COMPARISON:  None. FINDINGS: Urinary Tract: Area of scarring and cortical thinning in the visualized right kidney lower pole. Ureters are decompressed. Urinary bladder unremarkable. Bowel: Visualized large and small bowel unremarkable. Moderate stool in the rectosigmoid colon. Vascular/Lymphatic: Aorta and iliac vessels are normal caliber. Reproductive:  No visible focal abnormality. Other: Large complex fluid collection noted in the left inguinal region and extending into the left side of the scrotum measures 10.0 x 8.9 x 5.6 cm, compatible with abscess. Surrounding stranding within the subcutaneous soft tissues suggesting cellulitis. Moderate size right inguinal hernia containing fat. No free fluid or free air in the pelvis. Musculoskeletal: No acute bony abnormality. IMPRESSION: Large complex fluid collection in the left inguinal region and extending into the left side of the scrotum most compatible with large abscess. Surrounding inflammatory stranding in the subcutaneous soft tissues suggests cellulitis. Right inguinal hernia. Moderate stool in the rectosigmoid colon. Electronically Signed   By: Charlett Nose M.D.   On: 12/12/2016 14:36    EKG: Independently reviewed.  Assessment/Plan Active Problems:   Inguinal abscess   GERD  (gastroesophageal reflux disease)   Hypertension   Anxiety   DM (diabetes mellitus) (HCC)   Left inguinal cellulitis and abscess CT scan revealed large complex fluid collection in the left inguinal region and extending into the left side of the scrotum most  compatible with same For OR by Dr. Donell BeersByerly today for I and D  Started on antibiotics with Vanco IV q 12. Received 3 L IVF  WBC 15.8. LA 2.96->1.27 . T max 100.1 BP 110/75   Pulse 94 Resp 16 SpO2 100%    Admit to inpatient  Cellulitis order set Continue IV vancomycin   Check UDS Blood cultures, HIV Check  serum lactate  IVF   Type II Diabetes, poorly controlled due to non compliance  Current blood sugar level is 262 No results found for: HGBA1C Hgb A1C  SSI Heart healthy carb modified diet after OR    Hypertension BP 110/75   Pulse 94  Controlled Continue home anti-hypertensive medications in am after OR   Anxiety/ insomnia Continue home Xanax and desyrel   Polysubstance abuse patient partakes cocaine and marijuana, with last dose last night. He denies any alcohol over the last year. No tobacco use Patient wishes to make a significant change in his life.  Will request social work consultation  DVT prophylaxis: SCD's Code Status:   Full   Family Communication:  Discussed with patient Disposition Plan: Expect patient to be discharged to home after condition improves Consults called:   Surgery, Dr. Donell BeersByerly                                Social work  Admission status: Medsurg  Inpatient   Marcos EkeWERTMAN,Basia Mcginty E, PA-C [facility]   12/12/2016, 5:19 PM

## 2016-12-12 NOTE — H&P (View-Only) (Signed)
[facility] Consult/Admission Note  Jeffrey Murphy 08/11/1956  [id number].    Requesting MD: Dr. Pickering Chief Complaint/Reason for Consult: Groin abscess  HPI:  Patient is a 60-year-old male with a history of arthritis, hypertension, poorly controlled diabetes who presents to the emergency department with complaints of mass in his left groin. Patient states roughly 1 week ago he noticed a small boil on his scrotum that drained and went away. Roughly 3-5 days ago he noticed increased swelling and pain in his left groin area. He states constant pain, worse with touch, nonradiating pain, severe, nothing makes it better. Associated redness, subjective fevers, chills. He states he saw his primary care provider and received an antibiotic for this. He has taking antibiotic for 3 days without relief. Patient states for the last 3 weeks he has had issues with incontinence. Patient has a new sexual partner for roughly 1 month. Patient states he does not have pain with urination but when the urine touches the scrotal area it causes pain. Patient denies penile discharge, abdominal pain, nausea, vomiting, chest pain, shortness of breath, dizziness, headache.  ED course: Labs: Glucose 438, sodium 126, chloride 89, BUN 24, WBC 15.8 CT revealed large complex fluid collection in the left inguinal region and extending into the left side of the scrotum most compatible with large abscess. Surrounding inflammatory stranding in the subcutaneous soft tissue suggest cellulitis. Right inguinal hernia   ROS:  Review of Systems  Constitutional: Positive for chills and fever.  HENT: Negative for sore throat.   Eyes: Negative for discharge.  Respiratory: Negative for shortness of breath.   Cardiovascular: Negative for chest pain.  Gastrointestinal: Negative for abdominal pain, nausea and vomiting.  Genitourinary: Positive for dysuria. Negative for urgency.       Intermittent incontinence Inguinal  abscess/infection   Neurological: Negative for dizziness, loss of consciousness and weakness.  All other systems reviewed and are negative.    No family history on file.  Past Medical History:  Diagnosis Date  . Arthritis   . Diabetes mellitus without complication (HCC)   . Hypertension   . Wears glasses    reading    Past Surgical History:  Procedure Laterality Date  . COLONOSCOPY    . CYST REMOVAL NECK    . INGUINAL HERNIA REPAIR     right  . JOINT REPLACEMENT     right total knee  . PILONIDAL CYST EXCISION     back  . QUADRICEPS TENDON REPAIR Right 12/24/2014   Procedure: RIGHT QUADRICEP TENDON REPAIR ;  Surgeon: Naiping Michael Xu, MD;  Location: [facility];  Service: Orthopedics;  Laterality: Right;  . REPLACEMENT TOTAL KNEE      Social History:  reports that he has never smoked. He has never used smokeless tobacco. He reports that he drinks alcohol. He reports that he uses drugs, including Marijuana.  Allergies: No Known Allergies   (Not in a hospital admission)  Blood pressure 109/70, pulse 102, temperature 99.8 F (37.7 C), temperature source Oral, resp. rate 17, SpO2 100 %.  Physical Exam  Constitutional: He is oriented to person, place, and time and well-developed, well-nourished, and in no distress. No distress.  Appears older than stated age, well appearing  HENT:  Head: Normocephalic and atraumatic.  Eyes: Conjunctivae are normal. Right eye exhibits no discharge. Left eye exhibits no discharge.  Neck: Normal range of motion. Neck supple.  Pulmonary/Chest:  Rate and effort normal  Abdominal: Soft. Normal appearance. He exhibits no   distension. There is no tenderness. There is no rebound and no guarding.  Genitourinary:  Genitourinary Comments: Chaperone present for exam: Uncircumcised penis, no redness, swelling or tenderness noted to penis Redness noted to scrotum with scaly macerated skin noted to left side, no abscess noted to  scrotum, no tenderness noted to lower scrotum, moderate TTP and swelling noted to superior left aspect of scrotal sac is inguinal canal Large area of swelling, erythema and pain noted to left inguinal region with roughly 3 x 3 cm area of necrotic tissue with some purulent drainage noted  Musculoskeletal: Normal range of motion.  Neurological: He is alert and oriented to person, place, and time.  Skin: Skin is warm and dry. He is not diaphoretic.  Psychiatric: Mood and affect normal.  Nursing note and vitals reviewed.   Left inguinal region  Results for orders placed or performed during the hospital encounter of 12/12/16 (from the past 48 hour(s))  Comprehensive metabolic panel     Status: Abnormal   Collection Time: 12/12/16  1:18 PM  Result Value Ref Range   Sodium 127 (L) 135 - 145 mmol/L   Potassium 4.3 3.5 - 5.1 mmol/L   Chloride 89 (L) 101 - 111 mmol/L   CO2 24 22 - 32 mmol/L   Glucose, Bld 417 (H) 65 - 99 mg/dL   BUN 16 6 - 20 mg/dL   Creatinine, Ser 0.82 0.61 - 1.24 mg/dL   Calcium 8.9 8.9 - 10.3 mg/dL   Total Protein 7.8 6.5 - 8.1 g/dL   Albumin 2.6 (L) 3.5 - 5.0 g/dL   AST 16 15 - 41 U/L   ALT 11 (L) 17 - 63 U/L   Alkaline Phosphatase 85 38 - 126 U/L   Total Bilirubin 0.6 0.3 - 1.2 mg/dL   GFR calc non Af Amer >60 >60 mL/min   GFR calc Af Amer >60 >60 mL/min    Comment: (NOTE) The eGFR has been calculated using the CKD EPI equation. This calculation has not been validated in all clinical situations. eGFR's persistently <60 mL/min signify possible Chronic Kidney Disease.    Anion gap 14 5 - 15  CBC with Differential     Status: Abnormal   Collection Time: 12/12/16  1:18 PM  Result Value Ref Range   WBC 15.8 (H) 4.0 - 10.5 K/uL   RBC 4.81 4.22 - 5.81 MIL/uL   Hemoglobin 13.8 13.0 - 17.0 g/dL   HCT 40.4 39.0 - 52.0 %   MCV 84.0 78.0 - 100.0 fL   MCH 28.7 26.0 - 34.0 pg   MCHC 34.2 30.0 - 36.0 g/dL   RDW 12.7 11.5 - 15.5 %   Platelets 271 150 - 400 K/uL    Neutrophils Relative % 87 %   Neutro Abs 13.8 (H) 1.7 - 7.7 K/uL   Lymphocytes Relative 7 %   Lymphs Abs 1.1 0.7 - 4.0 K/uL   Monocytes Relative 5 %   Monocytes Absolute 0.8 0.1 - 1.0 K/uL   Eosinophils Relative 1 %   Eosinophils Absolute 0.1 0.0 - 0.7 K/uL   Basophils Relative 0 %   Basophils Absolute 0.0 0.0 - 0.1 K/uL  Protime-INR     Status: None   Collection Time: 12/12/16  1:18 PM  Result Value Ref Range   Prothrombin Time 14.9 11.4 - 15.2 seconds   INR 1.16   I-Stat CG4 Lactic Acid, ED     Status: Abnormal   Collection Time: 12/12/16  1:45 PM  Result Value  Ref Range   Lactic Acid, Venous 2.96 (HH) 0.5 - 1.9 mmol/L   Comment NOTIFIED PHYSICIAN   I-stat Chem 8, ED     Status: Abnormal   Collection Time: 12/12/16  1:46 PM  Result Value Ref Range   Sodium 126 (L) 135 - 145 mmol/L   Potassium 5.1 3.5 - 5.1 mmol/L   Chloride 89 (L) 101 - 111 mmol/L   BUN 24 (H) 6 - 20 mg/dL   Creatinine, Ser 0.70 0.61 - 1.24 mg/dL   Glucose, Bld 438 (H) 65 - 99 mg/dL   Calcium, Ion 1.01 (L) 1.15 - 1.40 mmol/L   TCO2 30 0 - 100 mmol/L   Hemoglobin 15.0 13.0 - 17.0 g/dL   HCT 44.0 39.0 - 52.0 %  Urinalysis, Routine w reflex microscopic     Status: Abnormal   Collection Time: 12/12/16  2:18 PM  Result Value Ref Range   Color, Urine YELLOW YELLOW   APPearance CLEAR CLEAR   Specific Gravity, Urine 1.033 (H) 1.005 - 1.030   pH 6.0 5.0 - 8.0   Glucose, UA >=500 (A) NEGATIVE mg/dL   Hgb urine dipstick NEGATIVE NEGATIVE   Bilirubin Urine NEGATIVE NEGATIVE   Ketones, ur NEGATIVE NEGATIVE mg/dL   Protein, ur NEGATIVE NEGATIVE mg/dL   Nitrite NEGATIVE NEGATIVE   Leukocytes, UA NEGATIVE NEGATIVE   RBC / HPF 0-5 0 - 5 RBC/hpf   WBC, UA 0-5 0 - 5 WBC/hpf   Bacteria, UA RARE (A) NONE SEEN   Squamous Epithelial / LPF 0-5 (A) NONE SEEN   Dg Chest 2 View  Result Date: 12/12/2016 CLINICAL DATA:  Fever. EXAM: CHEST  2 VIEW COMPARISON:  Radiographs of August 11, 2015. FINDINGS: The heart size and  mediastinal contours are within normal limits. Both lungs are clear. No pneumothorax or pleural effusion is noted. The visualized skeletal structures are unremarkable. IMPRESSION: No active cardiopulmonary disease. Electronically Signed   By: Marijo Conception, M.D.   On: 12/12/2016 14:52   Ct Pelvis W Contrast  Result Date: 12/12/2016 CLINICAL DATA:  Groin abscess, swelling for 1-2 weeks.  Pain. EXAM: CT PELVIS WITH CONTRAST TECHNIQUE: Multidetector CT imaging of the pelvis was performed using the standard protocol following the bolus administration of intravenous contrast. CONTRAST:  129m ISOVUE-300 IOPAMIDOL (ISOVUE-300) INJECTION 61% COMPARISON:  None. FINDINGS: Urinary Tract: Area of scarring and cortical thinning in the visualized right kidney lower pole. Ureters are decompressed. Urinary bladder unremarkable. Bowel: Visualized large and small bowel unremarkable. Moderate stool in the rectosigmoid colon. Vascular/Lymphatic: Aorta and iliac vessels are normal caliber. Reproductive:  No visible focal abnormality. Other: Large complex fluid collection noted in the left inguinal region and extending into the left side of the scrotum measures 10.0 x 8.9 x 5.6 cm, compatible with abscess. Surrounding stranding within the subcutaneous soft tissues suggesting cellulitis. Moderate size right inguinal hernia containing fat. No free fluid or free air in the pelvis. Musculoskeletal: No acute bony abnormality. IMPRESSION: Large complex fluid collection in the left inguinal region and extending into the left side of the scrotum most compatible with large abscess. Surrounding inflammatory stranding in the subcutaneous soft tissues suggests cellulitis. Right inguinal hernia. Moderate stool in the rectosigmoid colon. Electronically Signed   By: KRolm BaptiseM.D.   On: 12/12/2016 14:36      Assessment/Plan Admit to medicine  Problem list Hypertension Poorly controlled diabetes  Inguinal abscess/cellulitis - CT  scan revealed large complex fluid collection in the left inguinal region and extending  into the left side of the scrotum most compatible with large abscess. Surrounding inflammatory stranding in the subcutaneous soft tissues suggests cellulitis. Right inguinal hernia. - Dr. Byerly will take patient to OR this evening for I&D - IV antibiotics per primary - Nothing by mouth - Diabetes control her primary  Thank you for the consult. We will continue to follow this patient.  Johnesha Acheampong L Trisha Ken, PA-C [facility] 12/12/2016, 3:43 PM Pager: [phone] Consults: [phone] Mon-Fri 7:00 am-4:30 pm Sat-Sun 7:00 am-11:30 am  

## 2016-12-13 ENCOUNTER — Encounter (HOSPITAL_COMMUNITY): Payer: Self-pay | Admitting: General Surgery

## 2016-12-13 DIAGNOSIS — F419 Anxiety disorder, unspecified: Secondary | ICD-10-CM

## 2016-12-13 LAB — URINE CULTURE

## 2016-12-13 LAB — CBC
HCT: 33.2 % — ABNORMAL LOW (ref 39.0–52.0)
Hemoglobin: 11.1 g/dL — ABNORMAL LOW (ref 13.0–17.0)
MCH: 28.4 pg (ref 26.0–34.0)
MCHC: 33.4 g/dL (ref 30.0–36.0)
MCV: 84.9 fL (ref 78.0–100.0)
PLATELETS: 230 10*3/uL (ref 150–400)
RBC: 3.91 MIL/uL — ABNORMAL LOW (ref 4.22–5.81)
RDW: 12.8 % (ref 11.5–15.5)
WBC: 12.8 10*3/uL — AB (ref 4.0–10.5)

## 2016-12-13 LAB — GLUCOSE, CAPILLARY
GLUCOSE-CAPILLARY: 211 mg/dL — AB (ref 65–99)
GLUCOSE-CAPILLARY: 300 mg/dL — AB (ref 65–99)
Glucose-Capillary: 208 mg/dL — ABNORMAL HIGH (ref 65–99)
Glucose-Capillary: 390 mg/dL — ABNORMAL HIGH (ref 65–99)
Glucose-Capillary: 253 mg/dL — ABNORMAL HIGH (ref 65–99)

## 2016-12-13 LAB — HIV ANTIBODY (ROUTINE TESTING W REFLEX): HIV Screen 4th Generation wRfx: NONREACTIVE

## 2016-12-13 LAB — COMPREHENSIVE METABOLIC PANEL
ALT: 9 U/L — AB (ref 17–63)
AST: 12 U/L — AB (ref 15–41)
Albumin: 2 g/dL — ABNORMAL LOW (ref 3.5–5.0)
Alkaline Phosphatase: 68 U/L (ref 38–126)
Anion gap: 14 (ref 5–15)
BUN: 6 mg/dL (ref 6–20)
CHLORIDE: 97 mmol/L — AB (ref 101–111)
CO2: 23 mmol/L (ref 22–32)
CREATININE: 0.48 mg/dL — AB (ref 0.61–1.24)
Calcium: 8.1 mg/dL — ABNORMAL LOW (ref 8.9–10.3)
GFR calc Af Amer: >60 mL/min (ref >60)
Glucose, Bld: 233 mg/dL — ABNORMAL HIGH (ref 65–99)
Potassium: 3.8 mmol/L (ref 3.5–5.1)
Sodium: 134 mmol/L — ABNORMAL LOW (ref 135–145)
Total Bilirubin: 1 mg/dL (ref 0.3–1.2)
Total Protein: 5.9 g/dL — ABNORMAL LOW (ref 6.5–8.1)

## 2016-12-13 LAB — PROTIME-INR
INR: 1.15
Prothrombin Time: 14.8 seconds (ref 11.4–15.2)

## 2016-12-13 LAB — HEMOGLOBIN A1C
Hgb A1c MFr Bld: 12.6 % — ABNORMAL HIGH (ref 4.8–5.6)
Mean Plasma Glucose: 315 mg/dL

## 2016-12-13 LAB — LACTIC ACID, PLASMA: Lactic Acid, Venous: 1 mmol/L (ref 0.5–1.9)

## 2016-12-13 MED ORDER — VANCOMYCIN HCL IN DEXTROSE 1-5 GM/200ML-% IV SOLN
1000.0000 mg | Freq: Two times a day (BID) | INTRAVENOUS | Status: DC
  Administered 2016-12-13 – 2016-12-15 (×5): 1000 mg via INTRAVENOUS
  Filled 2016-12-13 (×5): qty 200

## 2016-12-13 MED ORDER — INSULIN STARTER KIT- SYRINGES (ENGLISH)
1.0000 | Freq: Once | Status: DC
  Filled 2016-12-13: qty 1

## 2016-12-13 MED ORDER — INFLUENZA VAC SPLIT QUAD 0.5 ML IM SUSY
0.5000 mL | PREFILLED_SYRINGE | INTRAMUSCULAR | Status: AC
  Administered 2016-12-14: 0.5 mL via INTRAMUSCULAR
  Filled 2016-12-13: qty 0.5

## 2016-12-13 MED ORDER — INSULIN ASPART 100 UNIT/ML ~~LOC~~ SOLN
3.0000 [IU] | Freq: Three times a day (TID) | SUBCUTANEOUS | Status: DC
  Administered 2016-12-13 – 2016-12-14 (×4): 3 [IU] via SUBCUTANEOUS

## 2016-12-13 MED ORDER — LIVING WELL WITH DIABETES BOOK
Freq: Once | Status: AC
  Administered 2016-12-13: 16:00:00
  Filled 2016-12-13: qty 1

## 2016-12-13 MED ORDER — INSULIN GLARGINE 100 UNIT/ML ~~LOC~~ SOLN
8.0000 [IU] | Freq: Every day | SUBCUTANEOUS | Status: DC
  Administered 2016-12-13: 8 [IU] via SUBCUTANEOUS
  Filled 2016-12-13: qty 0.08

## 2016-12-13 NOTE — Progress Notes (Signed)
[facility] Progress Note  1 Day Post-Op  Subjective: Resting comfortably in bed, concerned about his blood sugars and reporting that he is going to try to take better care of himself. Pain controlled. Denies fever, chills, nausea, or vomiting.  Afebrile, intermittent sinus tachycardia Objective: Vital signs in last 24 hours: Temp:  [97 F (36.1 C)-100.1 F (37.8 C)] 98.8 F (37.1 C) (02/06 0513) Pulse Rate:  [94-125] 100 (02/06 0513) Resp:  [11-25] 18 (02/06 0513) BP: (95-127)/(61-85) 110/70 (02/06 0513) SpO2:  [92 %-100 %] 98 % (02/06 0513) Weight:  [96.2 kg (212 lb)] 96.2 kg (212 lb) (02/05 1710) Last BM Date: 12/11/16  Intake/Output from previous day: 02/05 0701 - 02/06 0700 In: 5233.3 [I.V.:1433.3; IV Piggyback:3800] Out: 230 [Urine:200; Blood:30] Intake/Output this shift: Total I/O In: -  Out: 400 [Urine:400]  PE: Gen:  Alert, NAD, pleasant Pulm:  Normal effort Abd: Soft non-tender GU: superficial dressing removed revealing 4-5cm in diameter surgical wound with packing in place. 4-6cm surrounding induration. Serosanguinous/purulent drainage on bandage.  Ext:  No erythema, edema, or tenderness   Lab Results:   Recent Labs  12/12/16 1318 12/12/16 1346 12/13/16 0614  WBC 15.8*  --  12.8*  HGB 13.8 15.0 11.1*  HCT 40.4 44.0 33.2*  PLT 271  --  230   BMET  Recent Labs  12/12/16 1318 12/12/16 1346 12/13/16 0614  NA 127* 126* 134*  K 4.3 5.1 3.8  CL 89* 89* 97*  CO2 24  --  23  GLUCOSE 417* 438* 233*  BUN 16 24* 6  CREATININE 0.82 0.70 0.48*  CALCIUM 8.9  --  8.1*   PT/INR  Recent Labs  12/12/16 1318 12/13/16 0614  LABPROT 14.9 14.8  INR 1.16 1.15   CMP     Component Value Date/Time   NA 134 (L) 12/13/2016 0614   K 3.8 12/13/2016 0614   CL 97 (L) 12/13/2016 0614   CO2 23 12/13/2016 0614   GLUCOSE 233 (H) 12/13/2016 0614   BUN 6 12/13/2016 0614   CREATININE 0.48 (L) 12/13/2016 0614   CALCIUM 8.1 (L) 12/13/2016 0614   PROT  5.9 (L) 12/13/2016 0614   ALBUMIN 2.0 (L) 12/13/2016 0614   AST 12 (L) 12/13/2016 0614   ALT 9 (L) 12/13/2016 0614   ALKPHOS 68 12/13/2016 0614   BILITOT 1.0 12/13/2016 0614   GFRNONAA >60 12/13/2016 0614   GFRAA >60 12/13/2016 0614   Studies/Results: Dg Chest 2 View  Result Date: 12/12/2016 CLINICAL DATA:  Fever. EXAM: CHEST  2 VIEW COMPARISON:  Radiographs of August 11, 2015. FINDINGS: The heart size and mediastinal contours are within normal limits. Both lungs are clear. No pneumothorax or pleural effusion is noted. The visualized skeletal structures are unremarkable. IMPRESSION: No active cardiopulmonary disease. Electronically Signed   By: Lupita Raider, M.D.   On: 12/12/2016 14:52   Ct Pelvis W Contrast  Result Date: 12/12/2016 CLINICAL DATA:  Groin abscess, swelling for 1-2 weeks.  Pain. EXAM: CT PELVIS WITH CONTRAST TECHNIQUE: Multidetector CT imaging of the pelvis was performed using the standard protocol following the bolus administration of intravenous contrast. CONTRAST:  ISOVUE-300 IOPAMIDOL (ISOVUE-300) INJECTION 61% COMPARISON:  None. FINDINGS: Urinary Tract: Area of scarring and cortical thinning in the visualized right kidney lower pole. Ureters are decompressed. Urinary bladder unremarkable. Bowel: Visualized large and small bowel unremarkable. Moderate stool in the rectosigmoid colon. Vascular/Lymphatic: Aorta and iliac vessels are normal caliber. Reproductive:  No visible focal abnormality. Other: Large complex  fluid collection noted in the left inguinal region and extending into the left side of the scrotum measures 10.0 x 8.9 x 5.6 cm, compatible with abscess. Surrounding stranding within the subcutaneous soft tissues suggesting cellulitis. Moderate size right inguinal hernia containing fat. No free fluid or free air in the pelvis. Musculoskeletal: No acute bony abnormality. IMPRESSION: Large complex fluid collection in the left inguinal region and extending into the left  side of the scrotum most compatible with large abscess. Surrounding inflammatory stranding in the subcutaneous soft tissues suggests cellulitis. Right inguinal hernia. Moderate stool in the rectosigmoid colon. Electronically Signed   By: Charlett NoseKevin  Dover M.D.   On: 12/12/2016 14:36    Anti-infectives: Anti-infectives    Start     Dose/Rate Route Frequency Ordered Stop   12/13/16 1130  vancomycin (VANCOCIN) IVPB 1000 mg/200 mL premix     1,000 mg 200 mL/hr over 60 Minutes Intravenous Every 12 hours 12/13/16 1119     12/13/16 0600  vancomycin (VANCOCIN) IVPB 1000 mg/200 mL premix  Status:  Discontinued     1,000 mg 200 mL/hr over 60 Minutes Intravenous Every 12 hours 12/12/16 1357 12/13/16 1119   12/12/16 2330  piperacillin-tazobactam (ZOSYN) IVPB 3.375 g     3.375 g 12.5 mL/hr over 240 Minutes Intravenous Every 8 hours 12/12/16 2305     12/12/16 1345  vancomycin (VANCOCIN) 2,000 mg in sodium chloride 0.9 % 500 mL IVPB     2,000 mg 250 mL/hr over 120 Minutes Intravenous  Once 12/12/16 1338 12/12/16 1713     Assessment/Plan Left inguinal abscess S/P Incision and drainage, Irrigation and debridement 12/12/16 Dr. Almond LintFaera Byerly -  copious tracking into anterior thigh and abdominal wall -  GS/wound culture pending  -  WBC 12.8 from 15.8  Plan: pre-medicate for first bedside dressing change tomorrow, continue empiric vanc/zosyn and follow cultures  Glycemic control per medicine.    LOS: 1 day    Adam PhenixElizabeth S Tinesha Siegrist , [facility] 12/13/2016, 12:04 PM Pager: [phone] Consults: [phone] Mon-Fri 7:00 am-4:30 pm Sat-Sun 7:00 am-11:30 am

## 2016-12-13 NOTE — Progress Notes (Signed)
[facility]                                                                              Patient Demographics  Jeffrey Murphy, is Murphy 61 y.o. male, DOB - 09/11/1956, ZOX:[id number]  Admit date - 12/12/2016   Admitting Physician Jeffrey Rocksavid J Merrell, Jeffrey Murphy  Outpatient Primary Jeffrey Murphy for the patient is Jeffrey LekBURNETT,BRENT Murphy, Jeffrey Murphy  Outpatient specialists:   LOS - 1  days    Chief Complaint  Patient presents with  . Wound Infection  . Fever       Brief summary   Patient is Murphy 61 year old male with diabetes, hypertension, hyperlipidemia, arthritis presented with one-week history of increasing left groin mass initially described as Murphy small boil in the scrotum which he drained and returned about 3-5 days prior to admission. Patient reported seeing his PCP, received ciprofloxacin for it, took for 3 days without any relief. Patient presented to ED. CT scan revealed large complex fluid collection in the left inguinal region extending into the left side of the scrotum. Surgery was consulted and patient was admitted for further workup.   Assessment & Plan    Left inguinal cellulitis and abscess  - CT scan revealedlarge complex fluid collection in the left inguinal region and extending into the left side of the scrotum  - Gen. surgery was consulted, patient underwent I&D - Continue IV vancomycin and Zosyn - HIV negative, follow blood cultures, wound cultures   Type II Diabetes, poorly controlled due to non compliance - Hemoglobin A1c 12.6   - Placed diabetic coordinator consult and nutrition consult to teach him about diabetes and diet - For now I have placed him on Lantus 8 units at bedtime, NovoLog 3 units meal coverage and sliding scale insulin sensitive.  - Concerned about his compliance upon discharge.   Hypertension  Controlled, continue lisinopril  Anxiety/ insomnia Continue home Xanax and desyrel   Polysubstance abuse - admits to cocaine and marijuana, last dose the night  before the admission. - denies any alcohol over the last year. No tobacco use - request social work consultation  Code Status: Full CODE STATUS DVT Prophylaxis:  SCD's Family Communication: Discussed in detail with the patient, all imaging results, lab results explained to the patient  Disposition Plan:   Time Spent in minutes   25 minutes  Procedures:  I&D  Consultants:   surgery  Antimicrobials:   IV vancomycin 12/12/16  IV Zosyn 12/12/16   Medications  Scheduled Meds: . ALPRAZolam  0.5 mg Oral TID  . aspirin EC  325 mg Oral Daily  . folic acid  1 mg Oral Daily  . insulin aspart  0-9 Units Subcutaneous TID WC  . lisinopril  10 mg Oral Daily  . multivitamin with minerals  1 tablet Oral Daily  . piperacillin-tazobactam (ZOSYN)  IV  3.375 g Intravenous Q8H  . thiamine  100 mg Oral Daily   Or  . thiamine  100 mg Intravenous Daily  . vancomycin  1,000 mg Intravenous Q12H   Continuous Infusions: . sodium chloride 100 mL/hr at 12/13/16 0544   PRN Meds:.acetaminophen, bisacodyl, HYDROcodone-acetaminophen, HYDROmorphone (DILAUDID) injection, LORazepam **  OR** LORazepam, magnesium citrate, ondansetron **OR** ondansetron (ZOFRAN) IV, senna-docusate   Antibiotics   Anti-infectives    Start     Dose/Rate Route Frequency Ordered Stop   12/13/16 1130  vancomycin (VANCOCIN) IVPB 1000 mg/200 mL premix     1,000 mg 200 mL/hr over 60 Minutes Intravenous Every 12 hours 12/13/16 1119     12/13/16 0600  vancomycin (VANCOCIN) IVPB 1000 mg/200 mL premix  Status:  Discontinued     1,000 mg 200 mL/hr over 60 Minutes Intravenous Every 12 hours 12/12/16 1357 12/13/16 1119   12/12/16 2330  piperacillin-tazobactam (ZOSYN) IVPB 3.375 g     3.375 g 12.5 mL/hr over 240 Minutes Intravenous Every 8 hours 12/12/16 2305     12/12/16 1345  vancomycin (VANCOCIN) 2,000 mg in sodium chloride 0.9 % 500 mL IVPB     2,000 mg 250 mL/hr over 120 Minutes Intravenous  Once 12/12/16 1338 12/12/16 1713         Subjective:   Jeffrey Murphy was seen and examined today.  Pain in the scrotal region controlled currently. No fevers or chills. Feels better this morning after I&D last night.  Patient denies dizziness, chest pain, shortness of breath, abdominal pain, N/V/D/C, new weakness, numbess, tingling. No acute events overnight.    Objective:   Vitals:   12/12/16 2205 12/12/16 2210 12/12/16 2215 12/13/16 0513  BP: 95/61  101/62 110/70  Pulse: (!) 113 (!) 112  100  Resp: 19 13  18   Temp:   97 F (36.1 C) 98.8 F (37.1 C)  TempSrc:    Oral  SpO2: 93% 93% 95% 98%  Weight:        Intake/Output Summary (Last 24 hours) at 12/13/16 1220 Last data filed at 12/13/16 0920  Gross per 24 hour  Intake          5233.33 ml  Output              630 ml  Net          4603.33 ml     Wt Readings from Last 3 Encounters:  12/12/16 96.2 kg (212 lb)  03/12/15 96.3 kg (212 lb 4 oz)  03/01/15 93.9 kg (207 lb)     Exam  General: Alert and oriented x 3, NAD  HEENT:  PERRLA, EOMI, Anicteric Sclera, mucous membranes moist.   Neck: Supple, no JVD, no masses  Cardiovascular: S1 S2 auscultated, no rubs, murmurs or gallops. Regular rate and rhythm.  Respiratory: Clear to auscultation bilaterally, no wheezing, rales or rhonchi  Gastrointestinal: Soft, nontender, nondistended, + bowel sounds  Ext: no cyanosis clubbing or edema  Neuro: AAOx3, Cr N's II- XII. Strength 5/5 upper and lower extremities bilaterally  Skin: No rashes  Psych: Normal affect and demeanor, alert and oriented x3   GU: Dressing intact in the left groin region, scrotal area significantly inflamed   Data Reviewed:  I have personally reviewed following labs and imaging studies  Micro Results Recent Results (from the past 240 hour(s))  Urine culture     Status: Abnormal   Collection Time: 12/12/16  2:18 PM  Result Value Ref Range Status   Specimen Description URINE, RANDOM  Final   Special Requests NONE  Final    Culture MULTIPLE SPECIES PRESENT, SUGGEST RECOLLECTION (Murphy)  Final   Report Status 12/13/2016 FINAL  Final    Radiology Reports Dg Chest 2 View  Result Date: 12/12/2016 CLINICAL DATA:  Fever. EXAM: CHEST  2 VIEW COMPARISON:  Radiographs of  August 11, 2015. FINDINGS: The heart size and mediastinal contours are within normal limits. Both lungs are clear. No pneumothorax or pleural effusion is noted. The visualized skeletal structures are unremarkable. IMPRESSION: No active cardiopulmonary disease. Electronically Signed   By: Lupita Raider, M.D.   On: 12/12/2016 14:52   Ct Pelvis W Contrast  Result Date: 12/12/2016 CLINICAL DATA:  Groin abscess, swelling for 1-2 weeks.  Pain. EXAM: CT PELVIS WITH CONTRAST TECHNIQUE: Multidetector CT imaging of the pelvis was performed using the standard protocol following the bolus administration of intravenous contrast. CONTRAST:  ISOVUE-300 IOPAMIDOL (ISOVUE-300) INJECTION 61% COMPARISON:  None. FINDINGS: Urinary Tract: Area of scarring and cortical thinning in the visualized right kidney lower pole. Ureters are decompressed. Urinary bladder unremarkable. Bowel: Visualized large and small bowel unremarkable. Moderate stool in the rectosigmoid colon. Vascular/Lymphatic: Aorta and iliac vessels are normal caliber. Reproductive:  No visible focal abnormality. Other: Large complex fluid collection noted in the left inguinal region and extending into the left side of the scrotum measures 10.0 x 8.9 x 5.6 cm, compatible with abscess. Surrounding stranding within the subcutaneous soft tissues suggesting cellulitis. Moderate size right inguinal hernia containing fat. No free fluid or free air in the pelvis. Musculoskeletal: No acute bony abnormality. IMPRESSION: Large complex fluid collection in the left inguinal region and extending into the left side of the scrotum most compatible with large abscess. Surrounding inflammatory stranding in the subcutaneous soft tissues  suggests cellulitis. Right inguinal hernia. Moderate stool in the rectosigmoid colon. Electronically Signed   By: Charlett Nose M.D.   On: 12/12/2016 14:36    Lab Data:  CBC:  Recent Labs Lab 12/12/16 1318 12/12/16 1346 12/13/16 0614  WBC 15.8*  --  12.8*  NEUTROABS 13.8*  --   --   HGB 13.8 15.0 11.1*  HCT 40.4 44.0 33.2*  MCV 84.0  --  84.9  PLT 271  --  230   Basic Metabolic Panel:  Recent Labs Lab 12/12/16 1318 12/12/16 1346 12/13/16 0614  NA 127* 126* 134*  K 4.3 5.1 3.8  CL 89* 89* 97*  CO2 24  --  23  GLUCOSE 417* 438* 233*  BUN 16 24* 6  CREATININE 0.82 0.70 0.48*  CALCIUM 8.9  --  8.1*   GFR: CrCl cannot be calculated (Unknown ideal weight.). Liver Function Tests:  Recent Labs Lab 12/12/16 1318 12/13/16 0614  AST 16 12*  ALT 11* 9*  ALKPHOS 85 68  BILITOT 0.6 1.0  PROT 7.8 5.9*  ALBUMIN 2.6* 2.0*   No results for input(s): LIPASE, AMYLASE in the last 168 hours. No results for input(s): AMMONIA in the last 168 hours. Coagulation Profile:  Recent Labs Lab 12/12/16 1318 12/13/16 0614  INR 1.16 1.15   Cardiac Enzymes: No results for input(s): CKTOTAL, CKMB, CKMBINDEX, TROPONINI in the last 168 hours. BNP (last 3 results) No results for input(s): PROBNP in the last 8760 hours. HbA1C:  Recent Labs  12/12/16 1724  HGBA1C 12.6*   CBG:  Recent Labs Lab 12/12/16 1615 12/12/16 2037 12/12/16 2139 12/13/16 0802 12/13/16 1204  GLUCAP 262* 211* 249* 208* 390*   Lipid Profile: No results for input(s): CHOL, HDL, LDLCALC, TRIG, CHOLHDL, LDLDIRECT in the last 72 hours. Thyroid Function Tests: No results for input(s): TSH, T4TOTAL, FREET4, T3FREE, THYROIDAB in the last 72 hours. Anemia Panel: No results for input(s): VITAMINB12, FOLATE, FERRITIN, TIBC, IRON, RETICCTPCT in the last 72 hours. Urine analysis:    Component Value Date/Time  COLORURINE YELLOW 12/12/2016 1418   APPEARANCEUR CLEAR 12/12/2016 1418   LABSPEC 1.033 (H)  12/12/2016 1418   PHURINE 6.0 12/12/2016 1418   GLUCOSEU >=500 (Murphy) 12/12/2016 1418   HGBUR NEGATIVE 12/12/2016 1418   BILIRUBINUR NEGATIVE 12/12/2016 1418   KETONESUR NEGATIVE 12/12/2016 1418   PROTEINUR NEGATIVE 12/12/2016 1418   NITRITE NEGATIVE 12/12/2016 1418   LEUKOCYTESUR NEGATIVE 12/12/2016 1418     RAI,RIPUDEEP M.D. [facility] 12/13/2016, 12:20 PM  Pager: [phone] Between 7am to 7pm - call Pager - [phone]  After 7pm go to [email or web address] - password [email or web address]  Call night coverage person covering after 7pm

## 2016-12-13 NOTE — Progress Notes (Addendum)
Inpatient Diabetes Program Recommendations  AACE/ADA: New Consensus Statement on Inpatient Glycemic Control (2015)  Target Ranges:  Prepandial:   less than 140 mg/dL      Peak postprandial:   less than 180 mg/dL (1-2 hours)      Critically ill patients:  140 - 180 mg/dL   Lab Results  Component Value Date   GLUCAP 390 (H) 12/13/2016   HGBA1C 12.6 (H) 12/12/2016    Review of Glycemic Control Results for Jeffrey Murphy, Jeffrey Murphy (MRN [id number]) as of 12/13/2016 15:23  Ref. Range 12/12/2016 16:15 12/12/2016 20:37 12/12/2016 21:39 12/13/2016 08:02 12/13/2016 12:04  Glucose-Capillary Latest Ref Range: 65 - 99 mg/dL 262 (H) 211 (H) 249 (H) 208 (H) 390 (H)   Diabetes history: DM2 Outpatient Diabetes medications: Metformin 1 gm bid  Current orders for Inpatient glycemic control: Lantus 8 units daily + Novolog 3 units tid meal coverage + Novolog correction 0-9 units tid  Inpatient Diabetes Program Recommendations:   Spoke with pt about  A1C 12.6 results with them and explained what an A1C is, basic pathophysiology of DM Type 2, basic home care, basic diabetes diet nutrition principles, importance of checking CBGs and maintaining good CBG control to prevent long-term and short-term complications. Reviewed signs and symptoms of hyperglycemia and hypoglycemia and how to treat hypoglycemia at home. Also reviewed blood sugar goals at home.  RNs to provide ongoing basic DM education at bedside with this patient. Have ordered educational booklet, insulin starter kit, and DM videos. Nurses have started teaching insulin injections and please allow patient to give own injections.  Due to patient has no insurance currently, please consider 70/30 insulin Relion which can be purchased @ [facility] for $25.  Please consider 70/30 insulin 12-15 units bid ac breakfast & dinner.  Thank you, Nani Gasser. Rachal Dvorsky, RN, MSN, CDE Inpatient Glycemic Control Team Team Pager [phone] (8am-5pm) 12/13/2016 3:36 PM

## 2016-12-13 NOTE — Care Management Note (Signed)
Case Management Note  Patient Details  Name: Jeffrey Murphy MRN: [id number] Date of Birth: 05/16/1956  Subjective/Objective:                 Patient admitted for inguinel abscess with tunneling into scrotum. I&D'd waiting on cultures, receiving IV Abx. Failed Cipro outpatient as prescribed by PCP Jeffrey Murphy.    Action/Plan:  Watch for medication needs at DC depending on antibiotics/ insulins Rx'd. No insurance coverage noted at this time.    Expected Discharge Date:                  Expected Discharge Plan:  Home/Self Care  In-House Referral:     Discharge planning Services  CM Consult  Post Acute Care Choice:    Choice offered to:     DME Arranged:    DME Agency:     HH Arranged:    HH Agency:     Status of Service:  In process, will continue to follow  If discussed at Long Length of Stay Meetings, dates discussed:    Additional Comments:  Jeffrey SabalDebbie Loc Feinstein, RN 12/13/2016, 2:35 PM

## 2016-12-14 LAB — BASIC METABOLIC PANEL
ANION GAP: 10 (ref 5–15)
BUN: 5 mg/dL — ABNORMAL LOW (ref 6–20)
CHLORIDE: 98 mmol/L — AB (ref 101–111)
CO2: 27 mmol/L (ref 22–32)
CREATININE: 0.47 mg/dL — AB (ref 0.61–1.24)
Calcium: 8.1 mg/dL — ABNORMAL LOW (ref 8.9–10.3)
GFR calc non Af Amer: >60 mL/min (ref >60)
Glucose, Bld: 252 mg/dL — ABNORMAL HIGH (ref 65–99)
Potassium: 3.5 mmol/L (ref 3.5–5.1)
SODIUM: 135 mmol/L (ref 135–145)

## 2016-12-14 LAB — CBC
HCT: 31 % — ABNORMAL LOW (ref 39.0–52.0)
Hemoglobin: 10.3 g/dL — ABNORMAL LOW (ref 13.0–17.0)
MCH: 27.9 pg (ref 26.0–34.0)
MCHC: 33.2 g/dL (ref 30.0–36.0)
MCV: 84 fL (ref 78.0–100.0)
Platelets: 236 10*3/uL (ref 150–400)
RBC: 3.69 MIL/uL — ABNORMAL LOW (ref 4.22–5.81)
RDW: 12.5 % (ref 11.5–15.5)
WBC: 8.5 10*3/uL (ref 4.0–10.5)

## 2016-12-14 LAB — GLUCOSE, CAPILLARY
GLUCOSE-CAPILLARY: 251 mg/dL — AB (ref 65–99)
Glucose-Capillary: 235 mg/dL — ABNORMAL HIGH (ref 65–99)
Glucose-Capillary: 223 mg/dL — ABNORMAL HIGH (ref 65–99)
Glucose-Capillary: 176 mg/dL — ABNORMAL HIGH (ref 65–99)

## 2016-12-14 MED ORDER — INSULIN GLARGINE 100 UNIT/ML ~~LOC~~ SOLN
10.0000 [IU] | Freq: Every day | SUBCUTANEOUS | Status: DC
  Administered 2016-12-14: 10 [IU] via SUBCUTANEOUS
  Filled 2016-12-14: qty 0.1

## 2016-12-14 MED ORDER — KETOROLAC TROMETHAMINE 15 MG/ML IJ SOLN
15.0000 mg | Freq: Four times a day (QID) | INTRAMUSCULAR | Status: DC | PRN
  Administered 2016-12-14: 15 mg via INTRAVENOUS
  Filled 2016-12-14: qty 1

## 2016-12-14 MED ORDER — INSULIN ASPART 100 UNIT/ML ~~LOC~~ SOLN
5.0000 [IU] | Freq: Three times a day (TID) | SUBCUTANEOUS | Status: DC
  Administered 2016-12-14 – 2016-12-15 (×2): 5 [IU] via SUBCUTANEOUS

## 2016-12-14 MED ORDER — LIVING WELL WITH DIABETES BOOK
Freq: Once | Status: AC
  Administered 2016-12-14: 09:00:00
  Filled 2016-12-14: qty 1

## 2016-12-14 NOTE — Plan of Care (Signed)
Problem: Education: Goal: Knowledge of  General Education information/materials will improve Outcome: Progressing POC reviewed with pt.   

## 2016-12-14 NOTE — Progress Notes (Signed)
[facility]                                                                              Patient Demographics  Jeffrey Murphy, is a 61 y.o. male, DOB - Mar 20, 1956, RSW:[id number]  Admit date - 12/12/2016   Admitting Physician Jeffrey Dickens, MD  Outpatient Primary MD for the patient is Jeffrey Shire, MD  Outpatient specialists:   LOS - 2  days    Chief Complaint  Patient presents with  . Wound Infection  . Fever       Brief summary   Patient is a 61 year old male with diabetes, hypertension, hyperlipidemia, arthritis presented with one-week history of increasing left groin mass initially described as a small boil in the scrotum which he drained and returned about 3-5 days prior to admission. Patient reported seeing his PCP, received ciprofloxacin for it, took for 3 days without any relief. Patient presented to ED. CT scan revealed large complex fluid collection in the left inguinal region extending into the left side of the scrotum. Surgery was consulted and patient was admitted for further workup.   Assessment & Plan    Left inguinal cellulitis and abscess  - CT scan revealedlarge complex fluid collection in the left inguinal region and extending into the left side of the scrotum  - Gen. surgery was consulted, patient underwent I&D - Continue IV vancomycin and Zosyn - HIV negative, follow blood cultures, wound cultures   Type II Diabetes, poorly controlled due to non compliance - Hemoglobin A1c 12.6   - Placed diabetic coordinator consult and nutrition consult to teach him about diabetes and diet - Increase Lantus, NovoLog meal coverage and continue sliding scale insulin.  - Concerned about his compliance upon discharge, until we're sure that he will take insulin outpatient.   Hypertension  Controlled, continue lisinopril  Anxiety/ insomnia Continue home Xanax and desyrel   Polysubstance abuse - admits to cocaine and marijuana, last dose the  night before the admission. - denies any alcohol over the last year. No tobacco use   Code Status: Full CODE STATUS DVT Prophylaxis:  SCD's Family Communication: Discussed in detail with the patient, all imaging results, lab results explained to the patient  Disposition Plan:   Time Spent in minutes   25 minutes  Procedures:  I&D  Consultants:   surgery  Antimicrobials:   IV vancomycin 12/12/16  IV Zosyn 12/12/16   Medications  Scheduled Meds: . ALPRAZolam  0.5 mg Oral TID  . aspirin EC  325 mg Oral Daily  . folic acid  1 mg Oral Daily  . insulin aspart  0-9 Units Subcutaneous TID WC  . insulin aspart  5 Units Subcutaneous TID WC  . insulin glargine  10 Units Subcutaneous QHS  . insulin starter kit- syringes  1 kit Other Once  . lisinopril  10 mg Oral Daily  . multivitamin with minerals  1 tablet Oral Daily  . piperacillin-tazobactam (ZOSYN)  IV  3.375 g Intravenous Q8H  . thiamine  100 mg Oral Daily   Or  . thiamine  100 mg Intravenous Daily  . vancomycin  1,000 mg Intravenous  Q12H   Continuous Infusions: . sodium chloride 100 mL/hr at 12/14/16 0538   PRN Meds:.acetaminophen, bisacodyl, HYDROcodone-acetaminophen, HYDROmorphone (DILAUDID) injection, ketorolac, LORazepam **OR** LORazepam, magnesium citrate, ondansetron **OR** ondansetron (ZOFRAN) IV, senna-docusate   Antibiotics   Anti-infectives    Start     Dose/Rate Route Frequency Ordered Stop   12/13/16 1130  vancomycin (VANCOCIN) IVPB 1000 mg/200 mL premix     1,000 mg 200 mL/hr over 60 Minutes Intravenous Every 12 hours 12/13/16 1119     12/13/16 0600  vancomycin (VANCOCIN) IVPB 1000 mg/200 mL premix  Status:  Discontinued     1,000 mg 200 mL/hr over 60 Minutes Intravenous Every 12 hours 12/12/16 1357 12/13/16 1119   12/12/16 2330  piperacillin-tazobactam (ZOSYN) IVPB 3.375 g     3.375 g 12.5 mL/hr over 240 Minutes Intravenous Every 8 hours 12/12/16 2305     12/12/16 1345  vancomycin (VANCOCIN) 2,000 mg  in sodium chloride 0.9 % 500 mL IVPB     2,000 mg 250 mL/hr over 120 Minutes Intravenous  Once 12/12/16 1338 12/12/16 1713        Subjective:   Jeffrey Murphy was seen and examined today. Complaining of pain in the scrotal region, 5 out of 10. No fevers or chills.  Patient denies dizziness, chest pain, shortness of breath, abdominal pain, N/V/D/C, new weakness, numbess, tingling. No acute events overnight.    Objective:   Vitals:   12/14/16 0455 12/14/16 0500 12/14/16 1013 12/14/16 1103  BP:  112/69 107/71 111/73  Pulse:  88 89 86  Resp:  18    Temp:  97.9 F (36.6 C)    TempSrc:  Oral    SpO2:  99%    Weight: 84.2 kg (185 lb 9.6 oz)       Intake/Output Summary (Last 24 hours) at 12/14/16 1251 Last data filed at 12/14/16 0429  Gross per 24 hour  Intake          1613.33 ml  Output             1300 ml  Net           313.33 ml     Wt Readings from Last 3 Encounters:  12/14/16 84.2 kg (185 lb 9.6 oz)  03/12/15 96.3 kg (212 lb 4 oz)  03/01/15 93.9 kg (207 lb)     Exam  General: Alert and oriented x 3, NAD  HEENT:   Neck:   Cardiovascular: S1 S2 clear  Respiratory: Clear to auscultation bilaterally, no wheezing, rales or rhonchi  Gastrointestinal: Soft, nontender, nondistended, + bowel sounds  Ext: no cyanosis clubbing or edema  Neuro: No new deficits  Skin: No rashes  Psych: Normal affect and demeanor, alert and oriented x3   GU: Dressing intact in the left groin region   Data Reviewed:  I have personally reviewed following labs and imaging studies  Micro Results Recent Results (from the past 240 hour(s))  Culture, blood (Routine x 2)     Status: None (Preliminary result)   Collection Time: 12/12/16  1:18 PM  Result Value Ref Range Status   Specimen Description BLOOD RIGHT ANTECUBITAL  Final   Special Requests BOTTLES DRAWN AEROBIC AND ANAEROBIC 5CC  Final   Culture NO GROWTH 1 DAY  Final   Report Status PENDING  Incomplete  Culture, blood  (Routine x 2)     Status: None (Preliminary result)   Collection Time: 12/12/16  1:47 PM  Result Value Ref Range Status   Specimen  Description BLOOD RIGHT FOREARM  Final   Special Requests BOTTLES DRAWN AEROBIC AND ANAEROBIC 5CC  Final   Culture NO GROWTH 1 DAY  Final   Report Status PENDING  Incomplete  Urine culture     Status: Abnormal   Collection Time: 12/12/16  2:18 PM  Result Value Ref Range Status   Specimen Description URINE, RANDOM  Final   Special Requests NONE  Final   Culture MULTIPLE SPECIES PRESENT, SUGGEST RECOLLECTION (A)  Final   Report Status 12/13/2016 FINAL  Final  Aerobic/Anaerobic Culture (surgical/deep wound)     Status: None (Preliminary result)   Collection Time: 12/12/16  8:28 PM  Result Value Ref Range Status   Specimen Description ABSCESS  Final   Special Requests LEFT LINGUINAL PT ON VANC  Final   Gram Stain   Final    ABUNDANT WBC PRESENT, PREDOMINANTLY PMN ABUNDANT GRAM POSITIVE COCCI IN PAIRS IN CLUSTERS    Culture   Final    ABUNDANT STAPHYLOCOCCUS AUREUS SUSCEPTIBILITIES TO FOLLOW NO ANAEROBES ISOLATED; CULTURE IN PROGRESS FOR 5 DAYS    Report Status PENDING  Incomplete    Radiology Reports Dg Chest 2 View  Result Date: 12/12/2016 CLINICAL DATA:  Fever. EXAM: CHEST  2 VIEW COMPARISON:  Radiographs of August 11, 2015. FINDINGS: The heart size and mediastinal contours are within normal limits. Both lungs are clear. No pneumothorax or pleural effusion is noted. The visualized skeletal structures are unremarkable. IMPRESSION: No active cardiopulmonary disease. Electronically Signed   By: Marijo Conception, M.D.   On: 12/12/2016 14:52   Ct Pelvis W Contrast  Result Date: 12/12/2016 CLINICAL DATA:  Groin abscess, swelling for 1-2 weeks.  Pain. EXAM: CT PELVIS WITH CONTRAST TECHNIQUE: Multidetector CT imaging of the pelvis was performed using the standard protocol following the bolus administration of intravenous contrast. CONTRAST:  151m ISOVUE-300  IOPAMIDOL (ISOVUE-300) INJECTION 61% COMPARISON:  None. FINDINGS: Urinary Tract: Area of scarring and cortical thinning in the visualized right kidney lower pole. Ureters are decompressed. Urinary bladder unremarkable. Bowel: Visualized large and small bowel unremarkable. Moderate stool in the rectosigmoid colon. Vascular/Lymphatic: Aorta and iliac vessels are normal caliber. Reproductive:  No visible focal abnormality. Other: Large complex fluid collection noted in the left inguinal region and extending into the left side of the scrotum measures 10.0 x 8.9 x 5.6 cm, compatible with abscess. Surrounding stranding within the subcutaneous soft tissues suggesting cellulitis. Moderate size right inguinal hernia containing fat. No free fluid or free air in the pelvis. Musculoskeletal: No acute bony abnormality. IMPRESSION: Large complex fluid collection in the left inguinal region and extending into the left side of the scrotum most compatible with large abscess. Surrounding inflammatory stranding in the subcutaneous soft tissues suggests cellulitis. Right inguinal hernia. Moderate stool in the rectosigmoid colon. Electronically Signed   By: KRolm BaptiseM.D.   On: 12/12/2016 14:36    Lab Data:  CBC:  Recent Labs Lab 12/12/16 1318 12/12/16 1346 12/13/16 0614 12/14/16 0541  WBC 15.8*  --  12.8* 8.5  NEUTROABS 13.8*  --   --   --   HGB 13.8 15.0 11.1* 10.3*  HCT 40.4 44.0 33.2* 31.0*  MCV 84.0  --  84.9 84.0  PLT 271  --  230 2177  Basic Metabolic Panel:  Recent Labs Lab 12/12/16 1318 12/12/16 1346 12/13/16 0614 12/14/16 0541  NA 127* 126* 134* 135  K 4.3 5.1 3.8 3.5  CL 89* 89* 97* 98*  CO2 24  --  23 27  GLUCOSE 417* 438* 233* 252*  BUN 16 24* 6 5*  CREATININE 0.82 0.70 0.48* 0.47*  CALCIUM 8.9  --  8.1* 8.1*   GFR: CrCl cannot be calculated (Unknown ideal weight.). Liver Function Tests:  Recent Labs Lab 12/12/16 1318 12/13/16 0614  AST 16 12*  ALT 11* 9*  ALKPHOS 85 68    BILITOT 0.6 1.0  PROT 7.8 5.9*  ALBUMIN 2.6* 2.0*   No results for input(s): LIPASE, AMYLASE in the last 168 hours. No results for input(s): AMMONIA in the last 168 hours. Coagulation Profile:  Recent Labs Lab 12/12/16 1318 12/13/16 0614  INR 1.16 1.15   Cardiac Enzymes: No results for input(s): CKTOTAL, CKMB, CKMBINDEX, TROPONINI in the last 168 hours. BNP (last 3 results) No results for input(s): PROBNP in the last 8760 hours. HbA1C:  Recent Labs  12/12/16 1724  HGBA1C 12.6*   CBG:  Recent Labs Lab 12/13/16 1204 12/13/16 1647 12/13/16 2157 12/14/16 0803 12/14/16 1208  GLUCAP 390* 300* 253* 251* 235*   Lipid Profile: No results for input(s): CHOL, HDL, LDLCALC, TRIG, CHOLHDL, LDLDIRECT in the last 72 hours. Thyroid Function Tests: No results for input(s): TSH, T4TOTAL, FREET4, T3FREE, THYROIDAB in the last 72 hours. Anemia Panel: No results for input(s): VITAMINB12, FOLATE, FERRITIN, TIBC, IRON, RETICCTPCT in the last 72 hours. Urine analysis:    Component Value Date/Time   COLORURINE YELLOW 12/12/2016 1418   APPEARANCEUR CLEAR 12/12/2016 1418   LABSPEC 1.033 (H) 12/12/2016 1418   PHURINE 6.0 12/12/2016 1418   GLUCOSEU >=500 (A) 12/12/2016 1418   HGBUR NEGATIVE 12/12/2016 [place] 12/12/2016 1418   KETONESUR NEGATIVE 12/12/2016 1418   PROTEINUR NEGATIVE 12/12/2016 1418   NITRITE NEGATIVE 12/12/2016 1418   LEUKOCYTESUR NEGATIVE 12/12/2016 [place] M.D. [facility] 12/14/2016, 12:51 PM  Pager: [phone] Between 7am to 7pm - call Pager - [phone]  After 7pm go to [email or web address] - password [email or web address]  Call night coverage person covering after 7pm

## 2016-12-14 NOTE — Progress Notes (Signed)
Initial Nutrition Assessment  DOCUMENTATION CODES:   Not applicable  INTERVENTION:  Provided diabetes education. Continue Multivitamin  NUTRITION DIAGNOSIS:   Food and nutrition related knowledge deficit related to social / environmental circumstances (drug use) as evidenced by per patient/family report.  GOAL:   Patient will meet greater than or equal to 90% of their needs  MONITOR:   PO intake, I & O's, Labs, Weight trends  REASON FOR ASSESSMENT:   Consult Diet education (Uncontrolled DM)  ASSESSMENT:   Patient presenting with infection of his left groin. Patient had incision and drainage of left inguinal abscess on 2/5. PMHx significant for GERD, arthritis, HTN, Anxiety, DM, Drug abuse.   Patient was watching diabetes education videos prior to consult. Patient reported serious drug abuse PTA, but indicated he was ready to change his life by both discontinuing drugs and getting serious about controlling his diabetes.  Patient reported significant weight loss over the past 3-4 years due to drug abuse and uncontrolled diabetes.Patient reported eating three meals per day PTA and snacking a lot overnight during drug usage.   Nutrition focused physical exam found mild/moderate depletion of fat and muscle regions.  Patient reported using Metformin in the past, but that it resulted in serious leg cramping. Patient currently taking Lantus and Novolog.  Patient reported trying to control diabetes in years past and adhering to a high protein diet with a few vegetables. Provided "carbohydrate Counting for People with Diabetes" handout from [facility] Nutrition Care Manual. Discussed the importance of compliance to diabetes diet to promote better glycemic control and promote wound healing. Educated patient on sources of carbohydrates and incorporating carbohydrates into every meal and snack in order to stabilize blood glucose. Reviewed carb counting, label reading and meal planning. Teach  back method used. Expect fair-good compliance. RD contact information provided and encouraged patient to call if further questions arise.  Meds Reviewed: Folvite, Novolog, Lantus, MVI, Thiamine,   Labs Reviewed: Glucose 252, BUN 5, Calcium 8.1  Diet Order:  Diet Carb Modified Fluid consistency: Thin; Room service appropriate? Yes  Skin:  Reviewed, no issues  Last BM:  2/4  Height:   Ht Readings from Last 1 Encounters:  12/14/16 5' 8.75" (1.746 m)    Weight:   Wt Readings from Last 1 Encounters:  12/14/16 185 lb 9.6 oz (84.2 kg)    Ideal Body Weight:  70 kg  BMI:  Body mass index is 27.61 kg/m.  Estimated Nutritional Needs:   Kcal:  1900-2100 (23-25 kcal/kg)  Protein:  100-110 grams (1.3 g/kg)  Fluid:  >/=1.8 L/day  EDUCATION NEEDS:   Education needs addressed  Rex KrasGrace Ann Carole Deere M.S. Nutrition Dietetic Intern

## 2016-12-14 NOTE — Progress Notes (Signed)
2 Days Post-Op  Subjective: Wound painful and drains   Objective: Vital signs in last 24 hours: Temp:  [97.9 F (36.6 C)-99.7 F (37.6 C)] 97.9 F (36.6 C) (02/07 0500) Pulse Rate:  [88-98] 88 (02/07 0500) Resp:  [16-20] 18 (02/07 0500) BP: (93-112)/(54-69) 112/69 (02/07 0500) SpO2:  [97 %-99 %] 99 % (02/07 0500) Weight:  [84.2 kg (185 lb 9.6 oz)] 84.2 kg (185 lb 9.6 oz) (02/07 0455) Last BM Date: 12/11/16  Intake/Output from previous day: 02/06 0701 - 02/07 0700 In: 1613.3 [P.O.:240; I.V.:1123.3; IV Piggyback:250] Out: 1700 [Urine:1700] Intake/Output this shift: No intake/output data recorded.  Incision/Wound:open clean with erythema noted   Lab Results:   Recent Labs  12/13/16 0614 12/14/16 0541  WBC 12.8* 8.5  HGB 11.1* 10.3*  HCT 33.2* 31.0*  PLT 230 236   BMET  Recent Labs  12/13/16 0614 12/14/16 0541  NA 134* 135  K 3.8 3.5  CL 97* 98*  CO2 23 27  GLUCOSE 233* 252*  BUN 6 5*  CREATININE 0.48* 0.47*  CALCIUM 8.1* 8.1*   PT/INR  Recent Labs  12/12/16 1318 12/13/16 0614  LABPROT 14.9 14.8  INR 1.16 1.15   ABG No results for input(s): PHART, HCO3 in the last 72 hours.  Invalid input(s): PCO2, PO2  Studies/Results: Dg Chest 2 View  Result Date: 12/12/2016 CLINICAL DATA:  Fever. EXAM: CHEST  2 VIEW COMPARISON:  Radiographs of August 11, 2015. FINDINGS: The heart size and mediastinal contours are within normal limits. Both lungs are clear. No pneumothorax or pleural effusion is noted. The visualized skeletal structures are unremarkable. IMPRESSION: No active cardiopulmonary disease. Electronically Signed   By: Lupita Raider, M.D.   On: 12/12/2016 14:52   Ct Pelvis W Contrast  Result Date: 12/12/2016 CLINICAL DATA:  Groin abscess, swelling for 1-2 weeks.  Pain. EXAM: CT PELVIS WITH CONTRAST TECHNIQUE: Multidetector CT imaging of the pelvis was performed using the standard protocol following the bolus administration of intravenous contrast.  CONTRAST:  ISOVUE-300 IOPAMIDOL (ISOVUE-300) INJECTION 61% COMPARISON:  None. FINDINGS: Urinary Tract: Area of scarring and cortical thinning in the visualized right kidney lower pole. Ureters are decompressed. Urinary bladder unremarkable. Bowel: Visualized large and small bowel unremarkable. Moderate stool in the rectosigmoid colon. Vascular/Lymphatic: Aorta and iliac vessels are normal caliber. Reproductive:  No visible focal abnormality. Other: Large complex fluid collection noted in the left inguinal region and extending into the left side of the scrotum measures 10.0 x 8.9 x 5.6 cm, compatible with abscess. Surrounding stranding within the subcutaneous soft tissues suggesting cellulitis. Moderate size right inguinal hernia containing fat. No free fluid or free air in the pelvis. Musculoskeletal: No acute bony abnormality. IMPRESSION: Large complex fluid collection in the left inguinal region and extending into the left side of the scrotum most compatible with large abscess. Surrounding inflammatory stranding in the subcutaneous soft tissues suggests cellulitis. Right inguinal hernia. Moderate stool in the rectosigmoid colon. Electronically Signed   By: Charlett Nose M.D.   On: 12/12/2016 14:36    Anti-infectives: Anti-infectives    Start     Dose/Rate Route Frequency Ordered Stop   12/13/16 1130  vancomycin (VANCOCIN) IVPB 1000 mg/200 mL premix     1,000 mg 200 mL/hr over 60 Minutes Intravenous Every 12 hours 12/13/16 1119     12/13/16 0600  vancomycin (VANCOCIN) IVPB 1000 mg/200 mL premix  Status:  Discontinued     1,000 mg 200 mL/hr over 60 Minutes Intravenous Every 12 hours  12/12/16 1357 12/13/16 1119   12/12/16 2330  piperacillin-tazobactam (ZOSYN) IVPB 3.375 g     3.375 g 12.5 mL/hr over 240 Minutes Intravenous Every 8 hours 12/12/16 2305     12/12/16 1345  vancomycin (VANCOCIN) 2,000 mg in sodium chloride 0.9 % 500 mL IVPB     2,000 mg 250 mL/hr over 120 Minutes Intravenous  Once  12/12/16 1338 12/12/16 1713      Assessment/Plan: s/p Procedure(s): IRRIGATION AND DEBRIDEMENT ABSCESS (Left) Begin  Wet to dry dressing changes daily   LOS: 2 days    Josuha Fontanez A. 12/14/2016

## 2016-12-14 NOTE — Clinical Social Work Note (Signed)
Clinical Social Work Assessment  Patient Details  Name: Jeffrey Murphy MRN: [id number] Date of Birth: 08/15/1956  Date of referral:  12/14/16               Reason for consult:  Substance Use/ETOH Abuse                Permission sought to share information with:   (NA) Permission granted to share information::  No  Name::        Agency::     Relationship::     Contact Information:     Housing/Transportation Living arrangements for the past 2 months:  Single Family Home Source of Information:  Patient Patient Interpreter Needed:  None Criminal Activity/Legal Involvement Pertinent to Current Situation/Hospitalization:  No - Comment as needed Significant Relationships:  Spouse Lives with:  Spouse Do you feel safe going back to the place where you live?  Yes Need for family participation in patient care:  No (Coment)  Care giving concerns:  None- pt lives at home with his wife who is disabled.   Social Worker assessment / plan:  CSW spoke with pt about current drug use and rehab options.  Pt reports he has used cocaine for the past 3-4 years when he began hanging out with a woman who does drugs.  Pt states he used power cocaine for years but just recently tried crack and has used heroin 3 times.    Pt stating that this hospitalization has scared him and motivated him to stop drugs.  States that he thinks he will be motivated enough to quit but is agreeable to trying outpatient rehab options if he struggles at home.  States he wants to repair his relationship with his wife and avoid the people he was using drugs with.  Employment status:  Unemployed Health and safety inspectornsurance information:  Self Pay (Medicaid Pending) PT Recommendations:  Not assessed at this time Information / Referral to community resources:  Residential Substance Abuse Treatment Options, Outpatient Substance Abuse Treatment Options  Patient/Family's Response to care:  Agreeable to rehab options and hopeful that he will be able to  get off drugs with support from his wife.  Patient/Family's Understanding of and Emotional Response to Diagnosis, Current Treatment, and Prognosis:  Pt seems to be realistic about the health concerns caused by his drug use and the likelihood of his continued decline if he does not stop using drugs.  Emotional Assessment Appearance:  Appears stated age Attitude/Demeanor/Rapport:    Affect (typically observed):  Pleasant, Tearful/Crying Orientation:  Oriented to Self, Oriented to Place, Oriented to  Time, Oriented to Situation Alcohol / Substance use:  Illicit Drugs Psych involvement (Current and /or in the community):  No (Comment)  Discharge Needs  Concerns to be addressed:  Substance Abuse Concerns Readmission within the last 30 days:  No Current discharge risk:  Substance Abuse Barriers to Discharge:  Continued Medical Work up   Burna SisUris, Ilir Mahrt H, LCSW 12/14/2016, 12:09 PM

## 2016-12-14 NOTE — Progress Notes (Signed)
2 Days Post-Op  Subjective: Took dressing down and it was very painful for him. Wound looks ok.  He has some swelling below the open area.  With packing out it does not look as swollen.  Site is clean, just allot of bleeding from the site before dressing was taken down.  Objective: Vital signs in last 24 hours: Temp:  [97.9 F (36.6 C)-99.7 F (37.6 C)] 97.9 F (36.6 C) (02/07 0500) Pulse Rate:  [88-98] 88 (02/07 0500) Resp:  [16-20] 18 (02/07 0500) BP: (93-112)/(54-69) 112/69 (02/07 0500) SpO2:  [97 %-99 %] 99 % (02/07 0500) Weight:  [84.2 kg (185 lb 9.6 oz)] 84.2 kg (185 lb 9.6 oz) (02/07 0455) Last BM Date: 12/11/16 284 PO 1400 IV Urine 1700 Afebrile, VSS WBC is normal  Glucose 390- 250 range    Intake/Output from previous day: 02/06 0701 - 02/07 0700 In: 1613.3 [P.O.:240; I.V.:1123.3; IV Piggyback:250] Out: 1700 [Urine:1700] Intake/Output this shift: No intake/output data recorded.  General appearance: alert, cooperative, moderate distress and with removal of old packing. Skin: open site below, some erythema, better than preop picture.  .  I was concerned that he had some fluid below the open incision will watch.     Pre op picture       4 x 3 x 5 cm,  2-3 cm undermining around the edges of the wound.      Lab Results:   Recent Labs  12/13/16 0614 12/14/16 0541  WBC 12.8* 8.5  HGB 11.1* 10.3*  HCT 33.2* 31.0*  PLT 230 236    BMET  Recent Labs  12/13/16 0614 12/14/16 0541  NA 134* 135  K 3.8 3.5  CL 97* 98*  CO2 23 27  GLUCOSE 233* 252*  BUN 6 5*  CREATININE 0.48* 0.47*  CALCIUM 8.1* 8.1*   PT/INR  Recent Labs  12/12/16 1318 12/13/16 0614  LABPROT 14.9 14.8  INR 1.16 1.15     Recent Labs Lab 12/12/16 1318 12/13/16 0614  AST 16 12*  ALT 11* 9*  ALKPHOS 85 68  BILITOT 0.6 1.0  PROT 7.8 5.9*  ALBUMIN 2.6* 2.0*     Lipase  No results found for: LIPASE   Studies/Results: Dg Chest 2 View  Result Date:  12/12/2016 CLINICAL DATA:  Fever. EXAM: CHEST  2 VIEW COMPARISON:  Radiographs of August 11, 2015. FINDINGS: The heart size and mediastinal contours are within normal limits. Both lungs are clear. No pneumothorax or pleural effusion is noted. The visualized skeletal structures are unremarkable. IMPRESSION: No active cardiopulmonary disease. Electronically Signed   By: Marijo Conception, M.D.   On: 12/12/2016 14:52   Ct Pelvis W Contrast  Result Date: 12/12/2016 CLINICAL DATA:  Groin abscess, swelling for 1-2 weeks.  Pain. EXAM: CT PELVIS WITH CONTRAST TECHNIQUE: Multidetector CT imaging of the pelvis was performed using the standard protocol following the bolus administration of intravenous contrast. CONTRAST:  154m ISOVUE-300 IOPAMIDOL (ISOVUE-300) INJECTION 61% COMPARISON:  None. FINDINGS: Urinary Tract: Area of scarring and cortical thinning in the visualized right kidney lower pole. Ureters are decompressed. Urinary bladder unremarkable. Bowel: Visualized large and small bowel unremarkable. Moderate stool in the rectosigmoid colon. Vascular/Lymphatic: Aorta and iliac vessels are normal caliber. Reproductive:  No visible focal abnormality. Other: Large complex fluid collection noted in the left inguinal region and extending into the left side of the scrotum measures 10.0 x 8.9 x 5.6 cm, compatible with abscess. Surrounding stranding within the subcutaneous soft tissues suggesting cellulitis. Moderate size  right inguinal hernia containing fat. No free fluid or free air in the pelvis. Musculoskeletal: No acute bony abnormality. IMPRESSION: Large complex fluid collection in the left inguinal region and extending into the left side of the scrotum most compatible with large abscess. Surrounding inflammatory stranding in the subcutaneous soft tissues suggests cellulitis. Right inguinal hernia. Moderate stool in the rectosigmoid colon. Electronically Signed   By: Rolm Baptise M.D.   On: 12/12/2016 14:36     Medications: . ALPRAZolam  0.5 mg Oral TID  . aspirin EC  325 mg Oral Daily  . folic acid  1 mg Oral Daily  . Influenza vac split quadrivalent PF  0.5 mL Intramuscular Tomorrow-1000  . insulin aspart  0-9 Units Subcutaneous TID WC  . insulin aspart  3 Units Subcutaneous TID WC  . insulin glargine  8 Units Subcutaneous QHS  . insulin starter kit- syringes  1 kit Other Once  . lisinopril  10 mg Oral Daily  . living well with diabetes book   Does not apply Once  . multivitamin with minerals  1 tablet Oral Daily  . piperacillin-tazobactam (ZOSYN)  IV  3.375 g Intravenous Q8H  . thiamine  100 mg Oral Daily   Or  . thiamine  100 mg Intravenous Daily  . vancomycin  1,000 mg Intravenous Q12H   . sodium chloride 100 mL/hr at 12/14/16 0538   Specimen Description ABSCESS   Special Requests LEFT LINGUINAL PT ON VANC   Gram Stain ABUNDANT WBC PRESENT, PREDOMINANTLY PMN  ABUNDANT GRAM POSITIVE COCCI IN PAIRS IN CLUSTERS      Culture TOO YOUNG TO READ   Report Status PENDING     Assessment/Plan Left inguinal abscess S/P Incision and drainage, Irrigation and debridement 12/12/16 Dr. Stark Klein AODM - Poor control A1C 12.6 Anxiety/Insomnia Polysubstance abuse - cocaine & MJ FEN:  IV fluids/carb mod diet ID:  Vancomycin and Zosyn =>> 12/12/16 Day 3 DVT:  SCD only  Plan:  Wet to dry dressing BID, continue antibiotics and wait on culture results.  He will need home health to help with dressing changes at home.  Control of diabetes.         LOS: 2 days    Adrion Menz 12/14/2016 [phone]

## 2016-12-15 DIAGNOSIS — I10 Essential (primary) hypertension: Secondary | ICD-10-CM

## 2016-12-15 LAB — CBC
HCT: 32.6 % — ABNORMAL LOW (ref 39.0–52.0)
Hemoglobin: 10.7 g/dL — ABNORMAL LOW (ref 13.0–17.0)
MCH: 27.9 pg (ref 26.0–34.0)
MCHC: 32.8 g/dL (ref 30.0–36.0)
MCV: 84.9 fL (ref 78.0–100.0)
PLATELETS: 267 10*3/uL (ref 150–400)
RBC: 3.84 MIL/uL — ABNORMAL LOW (ref 4.22–5.81)
RDW: 13 % (ref 11.5–15.5)
WBC: 7.2 10*3/uL (ref 4.0–10.5)

## 2016-12-15 LAB — BASIC METABOLIC PANEL
Anion gap: 9 (ref 5–15)
BUN: 5 mg/dL — AB (ref 6–20)
CALCIUM: 8.4 mg/dL — AB (ref 8.9–10.3)
CO2: 28 mmol/L (ref 22–32)
CREATININE: 0.54 mg/dL — AB (ref 0.61–1.24)
Chloride: 100 mmol/L — ABNORMAL LOW (ref 101–111)
GFR calc Af Amer: >60 mL/min (ref >60)
GFR calc non Af Amer: >60 mL/min (ref >60)
Glucose, Bld: 176 mg/dL — ABNORMAL HIGH (ref 65–99)
Potassium: 3.7 mmol/L (ref 3.5–5.1)
SODIUM: 137 mmol/L (ref 135–145)

## 2016-12-15 LAB — GLUCOSE, CAPILLARY
GLUCOSE-CAPILLARY: 181 mg/dL — AB (ref 65–99)
Glucose-Capillary: 162 mg/dL — ABNORMAL HIGH (ref 65–99)
Glucose-Capillary: 249 mg/dL — ABNORMAL HIGH (ref 65–99)
Glucose-Capillary: 289 mg/dL — ABNORMAL HIGH (ref 65–99)

## 2016-12-15 LAB — VANCOMYCIN, TROUGH: VANCOMYCIN TR: 6 ug/mL — AB (ref 15–20)

## 2016-12-15 MED ORDER — INSULIN ASPART PROT & ASPART (70-30 MIX) 100 UNIT/ML ~~LOC~~ SUSP
15.0000 [IU] | Freq: Two times a day (BID) | SUBCUTANEOUS | Status: DC
  Administered 2016-12-15: 15 [IU] via SUBCUTANEOUS
  Filled 2016-12-15: qty 10

## 2016-12-15 MED ORDER — CEFAZOLIN SODIUM-DEXTROSE 2-4 GM/100ML-% IV SOLN
2.0000 g | Freq: Three times a day (TID) | INTRAVENOUS | Status: DC
  Administered 2016-12-15 – 2016-12-17 (×6): 2 g via INTRAVENOUS
  Filled 2016-12-15 (×7): qty 100

## 2016-12-15 MED ORDER — VANCOMYCIN HCL IN DEXTROSE 1-5 GM/200ML-% IV SOLN
1000.0000 mg | Freq: Three times a day (TID) | INTRAVENOUS | Status: DC
  Filled 2016-12-15: qty 200

## 2016-12-15 NOTE — Progress Notes (Signed)
[facility]                                                                              Patient Demographics  Jeffrey Murphy, is a 61 y.o. male, DOB - 1956/01/23, NLZ:[id number]  Admit date - 12/12/2016   Admitting Physician Jeffrey Dickens, MD  Outpatient Primary MD for the patient is Jeffrey Shire, MD  Outpatient specialists:   LOS - 3  days    Chief Complaint  Patient presents with  . Wound Infection  . Fever       Brief summary   Patient is a 61 year old male with diabetes, hypertension, hyperlipidemia, arthritis presented with one-week history of increasing left groin mass initially described as a small boil in the scrotum which he drained and returned about 3-5 days prior to admission. Patient reported seeing his PCP, received ciprofloxacin for it, took for 3 days without any relief. Patient presented to ED. CT scan revealed large complex fluid collection in the left inguinal region extending into the left side of the scrotum. Surgery was consulted and patient was admitted for further workup.   Assessment & Plan    Left inguinal cellulitis and abscess  - CT scan revealedlarge complex fluid collection in the left inguinal region and extending into the left side of the scrotum  - Gen. surgery was consulted, patient underwent I&D - Continue IV vancomycin and Zosyn - HIV negative, follow blood cultures, wound cultures - Discussed with case management, will need home health nurse for wound care dressing changes   Type II Diabetes, poorly controlled due to non compliance - Hemoglobin A1c 12.6   - Placed diabetic coordinator consult and nutrition consult to teach him about diabetes and diet - Discussed in detail with the patient regarding insulin, he appears to be very motivated. - Changed to insulin 70/30, 15 units twice a day with sliding scale insulin  Hypertension  Controlled, continue lisinopril  Anxiety/ insomnia Continue home Xanax and desyrel    Polysubstance abuse - admits to cocaine and marijuana, last dose the night before the admission. - denies any alcohol over the last year. No tobacco use   Code Status: Full CODE STATUS DVT Prophylaxis:  SCD's Family Communication: Discussed in detail with the patient, all imaging results, lab results explained to the patient  Disposition Plan:   Time Spent in minutes   25 minutes  Procedures:  I&D  Consultants:   surgery  Antimicrobials:   IV vancomycin 12/12/16  IV Zosyn 12/12/16   Medications  Scheduled Meds: . ALPRAZolam  0.5 mg Oral TID  . aspirin EC  325 mg Oral Daily  . folic acid  1 mg Oral Daily  . insulin aspart  0-9 Units Subcutaneous TID WC  . insulin aspart protamine- aspart  15 Units Subcutaneous BID WC  . insulin starter kit- syringes  1 kit Other Once  . lisinopril  10 mg Oral Daily  . multivitamin with minerals  1 tablet Oral Daily  . piperacillin-tazobactam (ZOSYN)  IV  3.375 g Intravenous Q8H  . thiamine  100 mg Oral Daily   Or  . thiamine  100 mg Intravenous  Daily  . vancomycin  1,000 mg Intravenous Q8H   Continuous Infusions: . sodium chloride 100 mL/hr at 12/14/16 1645   PRN Meds:.acetaminophen, bisacodyl, HYDROcodone-acetaminophen, HYDROmorphone (DILAUDID) injection, ketorolac, LORazepam **OR** LORazepam, magnesium citrate, ondansetron **OR** ondansetron (ZOFRAN) IV, senna-docusate   Antibiotics   Anti-infectives    Start     Dose/Rate Route Frequency Ordered Stop   12/15/16 1900  vancomycin (VANCOCIN) IVPB 1000 mg/200 mL premix     1,000 mg 200 mL/hr over 60 Minutes Intravenous Every 8 hours 12/15/16 1218     12/13/16 1130  vancomycin (VANCOCIN) IVPB 1000 mg/200 mL premix  Status:  Discontinued     1,000 mg 200 mL/hr over 60 Minutes Intravenous Every 12 hours 12/13/16 1119 12/15/16 1218   12/13/16 0600  vancomycin (VANCOCIN) IVPB 1000 mg/200 mL premix  Status:  Discontinued     1,000 mg 200 mL/hr over 60 Minutes Intravenous Every 12  hours 12/12/16 1357 12/13/16 1119   12/12/16 2330  piperacillin-tazobactam (ZOSYN) IVPB 3.375 g     3.375 g 12.5 mL/hr over 240 Minutes Intravenous Every 8 hours 12/12/16 2305     12/12/16 1345  vancomycin (VANCOCIN) 2,000 mg in sodium chloride 0.9 % 500 mL IVPB     2,000 mg 250 mL/hr over 120 Minutes Intravenous  Once 12/12/16 1338 12/12/16 1713        Subjective:   Jeffrey Murphy was seen and examined today. Currently pain controlled but worse during the dressing changes. Feels motivated about the lifestyle changes and insulin moving forward in life. No fevers or chills.  Patient denies dizziness, chest pain, shortness of breath, abdominal pain, N/V/D/C, new weakness, numbess, tingling. No acute events overnight.    Objective:   Vitals:   12/14/16 1426 12/14/16 2242 12/15/16 0647 12/15/16 0942  BP: 105/68 100/72 102/63 116/79  Pulse: 82 82 77   Resp: '18 18 18   '$ Temp: 97.7 F (36.5 C) 97.6 F (36.4 C) 97.4 F (36.3 C)   TempSrc: Oral Oral Oral   SpO2: 99% 99% 97%   Weight:   84.2 kg (185 lb 10 oz)   Height:        Intake/Output Summary (Last 24 hours) at 12/15/16 1327 Last data filed at 12/15/16 1200  Gross per 24 hour  Intake             1410 ml  Output              400 ml  Net             1010 ml     Wt Readings from Last 3 Encounters:  12/15/16 84.2 kg (185 lb 10 oz)  03/12/15 96.3 kg (212 lb 4 oz)  03/01/15 93.9 kg (207 lb)     Exam  General: Alert and oriented x 3, NAD  HEENT:   Neck:   Cardiovascular: S1 S2 clear  Respiratory: CTAB  Gastrointestinal: Soft, nontender, nondistended, + bowel sounds  Ext: no cyanosis clubbing or edema  Neuro: No new deficits  Skin: No rashes  Psych: Normal affect and demeanor, alert and oriented x3   GU: Dressing intact in the left groin region   Data Reviewed:  I have personally reviewed following labs and imaging studies  Micro Results Recent Results (from the past 240 hour(s))  Culture, blood (Routine  x 2)     Status: None (Preliminary result)   Collection Time: 12/12/16  1:18 PM  Result Value Ref Range Status   Specimen Description BLOOD  RIGHT ANTECUBITAL  Final   Special Requests BOTTLES DRAWN AEROBIC AND ANAEROBIC 5CC  Final   Culture NO GROWTH 3 DAYS  Final   Report Status PENDING  Incomplete  Culture, blood (Routine x 2)     Status: None (Preliminary result)   Collection Time: 12/12/16  1:47 PM  Result Value Ref Range Status   Specimen Description BLOOD RIGHT FOREARM  Final   Special Requests BOTTLES DRAWN AEROBIC AND ANAEROBIC 5CC  Final   Culture NO GROWTH 3 DAYS  Final   Report Status PENDING  Incomplete  Urine culture     Status: Abnormal   Collection Time: 12/12/16  2:18 PM  Result Value Ref Range Status   Specimen Description URINE, RANDOM  Final   Special Requests NONE  Final   Culture MULTIPLE SPECIES PRESENT, SUGGEST RECOLLECTION (A)  Final   Report Status 12/13/2016 FINAL  Final  Aerobic/Anaerobic Culture (surgical/deep wound)     Status: None (Preliminary result)   Collection Time: 12/12/16  8:28 PM  Result Value Ref Range Status   Specimen Description ABSCESS  Final   Special Requests LEFT LINGUINAL PT ON VANC  Final   Gram Stain   Final    ABUNDANT WBC PRESENT, PREDOMINANTLY PMN ABUNDANT GRAM POSITIVE COCCI IN PAIRS IN CLUSTERS    Culture   Final    ABUNDANT STAPHYLOCOCCUS AUREUS SUSCEPTIBILITIES TO FOLLOW NO ANAEROBES ISOLATED; CULTURE IN PROGRESS FOR 5 DAYS    Report Status PENDING  Incomplete    Radiology Reports Dg Chest 2 View  Result Date: 12/12/2016 CLINICAL DATA:  Fever. EXAM: CHEST  2 VIEW COMPARISON:  Radiographs of August 11, 2015. FINDINGS: The heart size and mediastinal contours are within normal limits. Both lungs are clear. No pneumothorax or pleural effusion is noted. The visualized skeletal structures are unremarkable. IMPRESSION: No active cardiopulmonary disease. Electronically Signed   By: Marijo Conception, M.D.   On: 12/12/2016 14:52    Ct Pelvis W Contrast  Result Date: 12/12/2016 CLINICAL DATA:  Groin abscess, swelling for 1-2 weeks.  Pain. EXAM: CT PELVIS WITH CONTRAST TECHNIQUE: Multidetector CT imaging of the pelvis was performed using the standard protocol following the bolus administration of intravenous contrast. CONTRAST:  156m ISOVUE-300 IOPAMIDOL (ISOVUE-300) INJECTION 61% COMPARISON:  None. FINDINGS: Urinary Tract: Area of scarring and cortical thinning in the visualized right kidney lower pole. Ureters are decompressed. Urinary bladder unremarkable. Bowel: Visualized large and small bowel unremarkable. Moderate stool in the rectosigmoid colon. Vascular/Lymphatic: Aorta and iliac vessels are normal caliber. Reproductive:  No visible focal abnormality. Other: Large complex fluid collection noted in the left inguinal region and extending into the left side of the scrotum measures 10.0 x 8.9 x 5.6 cm, compatible with abscess. Surrounding stranding within the subcutaneous soft tissues suggesting cellulitis. Moderate size right inguinal hernia containing fat. No free fluid or free air in the pelvis. Musculoskeletal: No acute bony abnormality. IMPRESSION: Large complex fluid collection in the left inguinal region and extending into the left side of the scrotum most compatible with large abscess. Surrounding inflammatory stranding in the subcutaneous soft tissues suggests cellulitis. Right inguinal hernia. Moderate stool in the rectosigmoid colon. Electronically Signed   By: KRolm BaptiseM.D.   On: 12/12/2016 14:36    Lab Data:  CBC:  Recent Labs Lab 12/12/16 1318 12/12/16 1346 12/13/16 0614 12/14/16 0541 12/15/16 0419  WBC 15.8*  --  12.8* 8.5 7.2  NEUTROABS 13.8*  --   --   --   --  HGB 13.8 15.0 11.1* 10.3* 10.7*  HCT 40.4 44.0 33.2* 31.0* 32.6*  MCV 84.0  --  84.9 84.0 84.9  PLT 271  --  230 236 573   Basic Metabolic Panel:  Recent Labs Lab 12/12/16 1318 12/12/16 1346 12/13/16 0614 12/14/16 0541  12/15/16 0419  NA 127* 126* 134* 135 137  K 4.3 5.1 3.8 3.5 3.7  CL 89* 89* 97* 98* 100*  CO2 24  --  '23 27 28  '$ GLUCOSE 417* 438* 233* 252* 176*  BUN 16 24* 6 5* 5*  CREATININE 0.82 0.70 0.48* 0.47* 0.54*  CALCIUM 8.9  --  8.1* 8.1* 8.4*   GFR: Estimated Creatinine Clearance: 107.8 mL/min (by C-G formula based on SCr of 0.54 mg/dL (L)). Liver Function Tests:  Recent Labs Lab 12/12/16 1318 12/13/16 0614  AST 16 12*  ALT 11* 9*  ALKPHOS 85 68  BILITOT 0.6 1.0  PROT 7.8 5.9*  ALBUMIN 2.6* 2.0*   No results for input(s): LIPASE, AMYLASE in the last 168 hours. No results for input(s): AMMONIA in the last 168 hours. Coagulation Profile:  Recent Labs Lab 12/12/16 1318 12/13/16 0614  INR 1.16 1.15   Cardiac Enzymes: No results for input(s): CKTOTAL, CKMB, CKMBINDEX, TROPONINI in the last 168 hours. BNP (last 3 results) No results for input(s): PROBNP in the last 8760 hours. HbA1C:  Recent Labs  12/12/16 1724  HGBA1C 12.6*   CBG:  Recent Labs Lab 12/14/16 1208 12/14/16 1704 12/14/16 2241 12/15/16 0756 12/15/16 1158  GLUCAP 235* 223* 176* 162* 249*   Lipid Profile: No results for input(s): CHOL, HDL, LDLCALC, TRIG, CHOLHDL, LDLDIRECT in the last 72 hours. Thyroid Function Tests: No results for input(s): TSH, T4TOTAL, FREET4, T3FREE, THYROIDAB in the last 72 hours. Anemia Panel: No results for input(s): VITAMINB12, FOLATE, FERRITIN, TIBC, IRON, RETICCTPCT in the last 72 hours. Urine analysis:    Component Value Date/Time   COLORURINE YELLOW 12/12/2016 1418   APPEARANCEUR CLEAR 12/12/2016 1418   LABSPEC 1.033 (H) 12/12/2016 1418   PHURINE 6.0 12/12/2016 1418   GLUCOSEU >=500 (A) 12/12/2016 1418   HGBUR NEGATIVE 12/12/2016 [place] 12/12/2016 1418   KETONESUR NEGATIVE 12/12/2016 1418   PROTEINUR NEGATIVE 12/12/2016 1418   NITRITE NEGATIVE 12/12/2016 1418   LEUKOCYTESUR NEGATIVE 12/12/2016 Metaline M.D. [facility] 12/15/2016, 1:27 PM  Pager: [phone] Between 7am to 7pm - call Pager - [phone]  After 7pm go to [email or web address] - password [email or web address]  Call night coverage person covering after 7pm

## 2016-12-15 NOTE — Care Management Note (Addendum)
Case Management Note  Patient Details  Name: Particia JasperJoe A Blaney MRN: [id number] Date of Birth: 07/04/1956  Subjective/Objective:                Spoke with patient and wife at bedside. Patient verbalizes understanding of CBG meter, [facility], and [facility] available, states his medications pta were $4 at [employer] (utilizes [address] location), verbalizes understanding of how to start process for Medicaid at [employer]. [facility]. They will assess for criteria for charity care for dressing changes. Patient and wife will need to learn how to do dressing changes as well prior to discharge. PCP Benedetto GoadFred [facility] Addendum- [facility], declined to be assessed for charity care, stated he and his wife would do dressing changes at home after DC. 2/9 At this time, Dr. Isidoro Donningai states patient will DC on Kephlex, [facility]. CM to continue to follow.   Action/Plan:   Expected Discharge Date:                  Expected Discharge Plan:  Home/Self Care  In-House Referral:     Discharge planning Services  CM Consult  Post Acute Care Choice:    Choice offered to:     DME Arranged:    DME Agency:     HH Arranged:    HH Agency:     Status of Service:  Completed, signed off  If discussed at [employer] Length of Stay Meetings, dates discussed:    Additional Comments:  Lawerance SabalDebbie Trease [place], RN 12/15/2016, 1:44 PM

## 2016-12-15 NOTE — Progress Notes (Signed)
[facility] Progress Note  3 Days Post-Op  Subjective: Doing better with dressing changes.  Site still has some purulent material in the base, it is otherwise clean.  He still has that raised and indurated area along the groin crease that I am concerned with.  It is not fluctuant at this time.  No erythema around the base of the open area.  It is very deep.  Objective: Vital signs in last 24 hours: Temp:  [97.4 F (36.3 C)-97.7 F (36.5 C)] 97.4 F (36.3 C) (02/08 0647) Pulse Rate:  [77-82] 77 (02/08 0647) Resp:  [18] 18 (02/08 0647) BP: (100-116)/(63-79) 116/79 (02/08 0942) SpO2:  [97 %-99 %] 97 % (02/08 0647) Weight:  [185 lb 10 oz (84.2 kg)] 185 lb 10 oz (84.2 kg) (02/08 0647) Last BM Date: 12/14/16 120 PO 1000 IV 400 urine Afebrile, VSS Labs OK  Intake/Output from previous day: 02/07 0701 - 02/08 0700 In: 1170 [P.O.:120; I.V.:1000; IV Piggyback:50] Out: 400 [Urine:400] Intake/Output this shift: No intake/output data recorded.  PE: Gen:  Alert, NAD, pleasant, cooperative Skin: no rashes noted, warm and dry.  Open area as noted above still worried about the area along groin crease.  He needed dilaudid for dressing change.    Lab Results:   Recent Labs  12/14/16 0541 12/15/16 0419  WBC 8.5 7.2  HGB 10.3* 10.7*  HCT 31.0* 32.6*  PLT 236 267   BMET  Recent Labs  12/14/16 0541 12/15/16 0419  NA 135 137  K 3.5 3.7  CL 98* 100*  CO2 27 28  GLUCOSE 252* 176*  BUN 5* 5*  CREATININE 0.47* 0.54*  CALCIUM 8.1* 8.4*   PT/INR  Recent Labs  12/12/16 1318 12/13/16 0614  LABPROT 14.9 14.8  INR 1.16 1.15   CMP     Component Value Date/Time   NA 137 12/15/2016 0419   K 3.7 12/15/2016 0419   CL 100 (L) 12/15/2016 0419   CO2 28 12/15/2016 0419   GLUCOSE 176 (H) 12/15/2016 0419   BUN 5 (L) 12/15/2016 0419   CREATININE 0.54 (L) 12/15/2016 0419   CALCIUM 8.4 (L) 12/15/2016 0419   PROT 5.9 (L) 12/13/2016 0614   ALBUMIN 2.0 (L) 12/13/2016 0614    AST 12 (L) 12/13/2016 0614   ALT 9 (L) 12/13/2016 0614   ALKPHOS 68 12/13/2016 0614   BILITOT 1.0 12/13/2016 0614   GFRNONAA >60 12/15/2016 0419   GFRAA >60 12/15/2016 0419   Lipase  No results found for: LIPASE     Studies/Results: No results found.  Anti-infectives: Anti-infectives    Start     Dose/Rate Route Frequency Ordered Stop   12/13/16 1130  vancomycin (VANCOCIN) IVPB 1000 mg/200 mL premix     1,000 mg 200 mL/hr over 60 Minutes Intravenous Every 12 hours 12/13/16 1119     12/13/16 0600  vancomycin (VANCOCIN) IVPB 1000 mg/200 mL premix  Status:  Discontinued     1,000 mg 200 mL/hr over 60 Minutes Intravenous Every 12 hours 12/12/16 1357 12/13/16 1119   12/12/16 2330  piperacillin-tazobactam (ZOSYN) IVPB 3.375 g     3.375 g 12.5 mL/hr over 240 Minutes Intravenous Every 8 hours 12/12/16 2305     12/12/16 1345  vancomycin (VANCOCIN) 2,000 mg in sodium chloride 0.9 % 500 mL IVPB     2,000 mg 250 mL/hr over 120 Minutes Intravenous  Once 12/12/16 1338 12/12/16 1713     Specimen Description ABSCESS   Special Requests LEFT LINGUINAL PT ON [facility]  Gram Stain ABUNDANT WBC PRESENT, PREDOMINANTLY PMN  ABUNDANT GRAM POSITIVE COCCI IN PAIRS IN CLUSTERS      Culture ABUNDANT STAPHYLOCOCCUS AUREUS  SUSCEPTIBILITIES TO FOLLOW  NO ANAEROBES ISOLATED; CULTURE IN PROGRESS FOR 5 DAYS       Assessment/Plan Left inguinal abscess S/P Incision and drainage, Irrigation and debridement 12/12/16 Dr. Almond LintFaera Byerly Staph Aureus on culture AODM - Poor control A1C 12.6 Anxiety/Insomnia Polysubstance abuse - cocaine & MJ  FEN:  IV fluids/carb mod diet ID:  Vancomycin and Zosyn =>> 2/05>> DVT:  SCD only  Plan:  Wet to dry dressing BID, continue antibiotics. Culture shows Staph aureus sensitivities still pending. He will need home health to help with dressing changes at home.  Control of diabetes.     LOS: 3 days    Jerre SimonJessica L Focht , [facility] 12/15/2016, 11:08  AM Pager: [phone] Consults: [phone] Mon-Fri 7:00 am-4:30 pm Sat-Sun 7:00 am-11:30 am

## 2016-12-15 NOTE — Progress Notes (Signed)
Pharmacy Antibiotic Note  Particia JasperJoe A Carel is a 61 y.o. male admitted on 12/12/2016 with sepsis/ cellulitis.  Pharmacy has been consulted for vancomycin and Zosyn dosing. Pt is now s/p I&D on 2/5. WBC wnl, afeb, SCr stable  Vancomycin trough is subtherapeutic at 6 today (drawn appropriately).  Plan: Increase Vancomycin to 1gm IV Q8H Continue Zosyn 3.375gm IV q 8 hours (4 hour infusion) F/u renal fxn, C&S, clinical status and trough at SS  Height: 5' 8.75" (174.6 cm) Weight: 185 lb 10 oz (84.2 kg) IBW/kg (Calculated) : 70.13  Temp (24hrs), Avg:97.6 F (36.4 C), Min:97.4 F (36.3 C), Max:97.7 F (36.5 C)   Recent Labs Lab 12/12/16 1318 12/12/16 1345 12/12/16 1346 12/12/16 1656 12/12/16 1808 12/12/16 2318 12/13/16 0614 12/14/16 0541 12/15/16 0419 12/15/16 1025  WBC 15.8*  --   --   --   --   --  12.8* 8.5 7.2  --   CREATININE 0.82  --  0.70  --   --   --  0.48* 0.47* 0.54*  --   LATICACIDVEN  --  2.96*  --  1.27 1.8 1.0  --   --   --   --   VANCOTROUGH  --   --   --   --   --   --   --   --   --  6*    Estimated Creatinine Clearance: 107.8 mL/min (by C-G formula based on SCr of 0.54 mg/dL (L)).    No Known Allergies  Antimicrobials this admission: Vanc 2/5>> Zosyn 2/5>>  Dose adjustments this admission: N/A  Microbiology results: 2/5 wound culture>>staph aureus 2/5 BCx>> ngtd 2/5 urine>>needs recollection  Thank you for allowing us to participate in this patients care.  Signe Coltonya C Abiel Antrim, PharmD Clinical phone for 12/15/2016 from 7a-3:30p: [phone] If after 3:30p, please call main pharmacy at: [phone] 12/15/2016 12:54 PM

## 2016-12-16 LAB — RAPID URINE DRUG SCREEN, HOSP PERFORMED
Amphetamines: NOT DETECTED
BARBITURATES: NOT DETECTED
BENZODIAZEPINES: POSITIVE — AB
Cocaine: NOT DETECTED
Opiates: POSITIVE — AB
Tetrahydrocannabinol: NOT DETECTED

## 2016-12-16 LAB — CBC
HEMATOCRIT: 31.4 % — AB (ref 39.0–52.0)
Hemoglobin: 10.3 g/dL — ABNORMAL LOW (ref 13.0–17.0)
MCH: 28.1 pg (ref 26.0–34.0)
MCHC: 32.8 g/dL (ref 30.0–36.0)
MCV: 85.6 fL (ref 78.0–100.0)
PLATELETS: 280 10*3/uL (ref 150–400)
RBC: 3.67 MIL/uL — ABNORMAL LOW (ref 4.22–5.81)
RDW: 12.7 % (ref 11.5–15.5)
WBC: 6.4 10*3/uL (ref 4.0–10.5)

## 2016-12-16 LAB — BASIC METABOLIC PANEL
ANION GAP: 13 (ref 5–15)
BUN: 11 mg/dL (ref 6–20)
CALCIUM: 8.6 mg/dL — AB (ref 8.9–10.3)
CO2: 26 mmol/L (ref 22–32)
CREATININE: 0.62 mg/dL (ref 0.61–1.24)
Chloride: 101 mmol/L (ref 101–111)
GFR calc Af Amer: >60 mL/min (ref >60)
GLUCOSE: 275 mg/dL — AB (ref 65–99)
Potassium: 3.9 mmol/L (ref 3.5–5.1)
Sodium: 140 mmol/L (ref 135–145)

## 2016-12-16 LAB — GLUCOSE, CAPILLARY
GLUCOSE-CAPILLARY: 208 mg/dL — AB (ref 65–99)
GLUCOSE-CAPILLARY: 181 mg/dL — AB (ref 65–99)
GLUCOSE-CAPILLARY: 161 mg/dL — AB (ref 65–99)
Glucose-Capillary: 103 mg/dL — ABNORMAL HIGH (ref 65–99)

## 2016-12-16 MED ORDER — SENNOSIDES-DOCUSATE SODIUM 8.6-50 MG PO TABS
1.0000 | ORAL_TABLET | Freq: Two times a day (BID) | ORAL | Status: DC
  Administered 2016-12-16 – 2016-12-17 (×3): 1 via ORAL
  Filled 2016-12-16 (×3): qty 1

## 2016-12-16 MED ORDER — POLYETHYLENE GLYCOL 3350 17 G PO PACK: 17.0000 g | PACK | Freq: Every day | ORAL | Status: DC | PRN

## 2016-12-16 MED ORDER — INSULIN ASPART PROT & ASPART (70-30 MIX) 100 UNIT/ML ~~LOC~~ SUSP
20.0000 [IU] | Freq: Two times a day (BID) | SUBCUTANEOUS | Status: DC
  Administered 2016-12-16 – 2016-12-17 (×3): 20 [IU] via SUBCUTANEOUS

## 2016-12-16 NOTE — Progress Notes (Signed)
4 Days Post-Op  Subjective: Making slow progress.  He is very tender at any touch around the I&D site.  Open site OK still some slough and purulent stuff in the base.  Indurated area is better.    Objective: Vital signs in last 24 hours: Temp:  [98 F (36.7 C)-98.9 F (37.2 C)] 98.9 F (37.2 C) (02/09 0506) Pulse Rate:  [73-85] 73 (02/09 0506) Resp:  [16-18] 18 (02/09 0506) BP: (106-123)/(72-80) 111/72 (02/09 0506) SpO2:  [96 %-98 %] 96 % (02/09 0506) Weight:  [86.8 kg (191 lb 6.4 oz)] 86.8 kg (191 lb 6.4 oz) (02/09 0430) Last BM Date: 12/15/16 400 PO recorded 2500 IV Urine x 2 recorded afebrile VSS Glucose 275 Intake/Output from previous day: 02/08 0701 - 02/09 0700 In: 3163.3 [P.O.:400; I.V.:2463.3; IV Piggyback:300] Out: -  Intake/Output this shift: No intake/output data recorded.  General appearance: alert, cooperative and no distress Skin: Skin color, texture, turgor normal. No rashes or lesions or open site making slow progress.  Continue wet to dry with kerlix BID  Lab Results:   Recent Labs  12/15/16 0419 12/16/16 0436  WBC 7.2 6.4  HGB 10.7* 10.3*  HCT 32.6* 31.4*  PLT 267 280    BMET  Recent Labs  12/15/16 0419 12/16/16 0436  NA 137 140  K 3.7 3.9  CL 100* 101  CO2 28 26  GLUCOSE 176* 275*  BUN 5* 11  CREATININE 0.54* 0.62  CALCIUM 8.4* 8.6*   PT/INR No results for input(s): LABPROT, INR in the last 72 hours.   Recent Labs Lab 12/12/16 1318 12/13/16 0614  AST 16 12*  ALT 11* 9*  ALKPHOS 85 68  BILITOT 0.6 1.0  PROT 7.8 5.9*  ALBUMIN 2.6* 2.0*     Lipase  No results found for: LIPASE   Studies/Results: No results found.  Medications: . ALPRAZolam  0.5 mg Oral TID  . aspirin EC  325 mg Oral Daily  .  ceFAZolin (ANCEF) IV  2 g Intravenous M0H  . folic acid  1 mg Oral Daily  . insulin aspart  0-9 Units Subcutaneous TID WC  . insulin aspart protamine- aspart  20 Units Subcutaneous BID WC  . insulin starter kit- syringes  1  kit Other Once  . lisinopril  10 mg Oral Daily  . multivitamin with minerals  1 tablet Oral Daily  . thiamine  100 mg Oral Daily   Or  . thiamine  100 mg Intravenous Daily   Culture ABUNDANT STAPHYLOCOCCUS AUREUS  NO ANAEROBES ISOLATED; CULTURE IN PROGRESS FOR 5 DAYS      Report Status PENDING   Organism ID, Bacteria STAPHYLOCOCCUS AUREUS   Resulting Agency [facility]  Susceptibility    Staphylococcus aureus    MIC    CIPROFLOXACIN >=8 RESISTANT  Resistant    CLINDAMYCIN <=0.25 SENSITIVE "><=0.25 SENS... Sensitive    ERYTHROMYCIN <=0.25 SENSITIVE "><=0.25 SENS... Sensitive    GENTAMICIN <=0.5 SENSITIVE "><=0.5 SENSI... Sensitive    Inducible Clindamycin NEGATIVE  Sensitive    OXACILLIN 0.5 SENSITIVE  Sensitive    RIFAMPIN <=0.5 SENSITIVE "><=0.5 SENSI... Sensitive    TETRACYCLINE <=1 SENSITIVE "><=1 SENSITIVE  Sensitive    TRIMETH/SULFA <=10 SENSITIVE "><=10 SENSIT... Sensitive    VANCOMYCIN <=0.5 SENSITIVE "><=0.5 SENSI... Sensitive       Assessment/Plan Left inguinal abscess S/P Incision and drainage, Irrigation and debridement 12/12/16 Dr. Stark Klein Staph Aureus on culture AODM - Poor control A1C 12.6 Anxiety/Insomnia Polysubstance abuse - cocaine & MJ FEN: IV  fluids/carb mod diet ID: Vancomycin and Zosyn =>>2/05 - 12/16/15 Ancef started 12/15/16 =>> day 2 DVT: SCD only   Plan:  Antibiotics per Dr. Tana Coast, he can go home when he can do dressing changes without IV pain meds  He can shower and get soap and water into the open site now. I will get follow up in AVS for Dr. Barry Dienes. Dressing instructions also in AVS     LOS: 4 days    Jaishon Krisher 12/16/2016 [phone]

## 2016-12-16 NOTE — Discharge Instructions (Signed)
Mechanical Wound Debridement Introduction Mechanical wound debridement is a treatment to remove dead tissue from a wound. This helps the wound heal. The treatment involves cleaning the wound (irrigation) and using a pad or gauze (dressing) to remove dead tissue and debris from the wound. There are different types of mechanical wound debridement. Depending on the wound, you may need to repeat this procedure or change to another form of debridement as your wound starts to heal. Tell a health care provider about:  Any allergies you have.  All medicines you are taking, including vitamins, herbs, eye drops, creams, and over-the-counter medicines.  Any blood disorders you have.  Any medical conditions you have, including any conditions that:  Cause a significant decrease in blood circulation to the part of the body where the wound is, such as peripheral vascular disease.  Compromise your defense (immune) system or white blood count.  Any surgeries you have had.  Whether you are pregnant or may be pregnant. What are the risks? Generally, this is a safe procedure. However, problems may occur, including:  Infection.  Bleeding.  Damage to healthy tissue in and around your wound.  Soreness or pain.  Failure of the wound to heal.  Scarring. What happens before the procedure? You may be given antibiotic medicine to help prevent infection. What happens during the procedure?  Your health care provider may apply a numbing medicine (topical anesthetic) to the wound.  Your health care provider will irrigate your wound with a germ-free (sterile), salt-water (saline) solution. This removes debris, bacteria, and dead tissue.  Depending on what type of mechanical wound debridement you are having, your health care provider may do one of the following:  Put a dressing on your wound. You may have dry gauze pad placed into the wound. Your health care provider will remove the gauze after the wound  is dry. Any dead tissue and debris that has dried into the gauze will be lifted out of the wound (wet-to-dry debridement).  Use a type of pad (monofilament fiber debridement pad). This pad has a fluffy surface on one side that picks up dead tissue and debris from your wound. Your health care provider wets the pad and wipes it over your wound for several minutes.  Irrigate your wound with a pressurized stream of solution such as saline or water.  Once your health care provider is finished, he or she may apply a light dressing to your wound. The procedure may vary among health care providers and hospitals. What happens after the procedure?  You may receive medicine for pain.  You will continue to receive antibiotic medicine if it was started before your procedure. This information is not intended to replace advice given to you by your health care provider. Make sure you discuss any questions you have with your health care provider. Document Released: 07/15/2015 Document Revised: 03/31/2016 Document Reviewed: 03/04/2015  2017 [employer]

## 2016-12-16 NOTE — Progress Notes (Signed)
[facility]                                                                              Patient Demographics  Jeffrey Murphy, is a 61 y.o. male, DOB - 14-Mar-1956, JJK:[id number]  Admit date - 12/12/2016   Admitting Physician Waldemar Dickens, MD  Outpatient Primary MD for the patient is Stephens Shire, MD  Outpatient specialists:   LOS - 4  days    Chief Complaint  Patient presents with  . Wound Infection  . Fever       Brief summary   Patient is a 61 year old male with diabetes, hypertension, hyperlipidemia, arthritis presented with one-week history of increasing left groin mass initially described as a small boil in the scrotum which he drained and returned about 3-5 days prior to admission. Patient reported seeing his PCP, received ciprofloxacin for it, took for 3 days without any relief. Patient presented to ED. CT scan revealed large complex fluid collection in the left inguinal region extending into the left side of the scrotum. Surgery was consulted and patient was admitted for further workup.   Assessment & Plan    Left inguinal cellulitis and abscess  - CT scan revealedlarge complex fluid collection in the left inguinal region and extending into the left side of the scrotum  - Gen. surgery was consulted, patient underwent I&D - Antibiotics changed to IV Ancef per the wound cultures growing MSSA - HIV negative, follow blood cultures, wound cultures - will need home health nurse for wound care dressing changes, per case mgmt he declined HHRN (??) - Patient tearful and crying during the dressing changes today with extreme pain, doubt he will be able to manage this himself at home. He will need to do the dressing changes without IV pain medications for discharge.    Type II Diabetes, poorly controlled due to non compliance, infection - Hemoglobin A1c 12.6   - Placed diabetic coordinator consult and nutrition consult to teach him about diabetes and  diet - Discussed in detail with the patient regarding insulin, he appears to be very motivated. - increased insulin 70/30,  To 20 units twice a day with sliding scale insulin  Hypertension  Controlled, continue lisinopril  Anxiety/ insomnia Continue home Xanax and desyrel   Polysubstance abuse - admits to cocaine and marijuana, last dose the night before the admission. - denies any alcohol over the last year. No tobacco use   Code Status: Full CODE STATUS DVT Prophylaxis:  SCD's Family Communication: Discussed in detail with the patient, all imaging results, lab results explained to the patient  Disposition Plan: DC once patient is able to change his dressings without IV pain medication. He needs home health nurse  Time Spent in minutes   25 minutes  Procedures:  I&D  Consultants:   surgery  Antimicrobials:   IV vancomycin 12/12/16  IV Zosyn 12/12/16   Medications  Scheduled Meds: . ALPRAZolam  0.5 mg Oral TID  . aspirin EC  325 mg Oral Daily  .  ceFAZolin (ANCEF) IV  2 g Intravenous B7J  . folic acid  1 mg Oral Daily  .  insulin aspart  0-9 Units Subcutaneous TID WC  . insulin aspart protamine- aspart  20 Units Subcutaneous BID WC  . insulin starter kit- syringes  1 kit Other Once  . lisinopril  10 mg Oral Daily  . multivitamin with minerals  1 tablet Oral Daily  . senna-docusate  1 tablet Oral BID  . thiamine  100 mg Oral Daily   Continuous Infusions: . sodium chloride 100 mL/hr at 12/15/16 2251   PRN Meds:.acetaminophen, bisacodyl, HYDROcodone-acetaminophen, HYDROmorphone (DILAUDID) injection, ketorolac, magnesium citrate, ondansetron **OR** ondansetron (ZOFRAN) IV, polyethylene glycol, senna-docusate   Antibiotics   Anti-infectives    Start     Dose/Rate Route Frequency Ordered Stop   12/15/16 1900  vancomycin (VANCOCIN) IVPB 1000 mg/200 mL premix  Status:  Discontinued     1,000 mg 200 mL/hr over 60 Minutes Intravenous Every 8 hours 12/15/16 1218  12/15/16 1510   12/15/16 1515  ceFAZolin (ANCEF) IVPB 2g/100 mL premix     2 g 200 mL/hr over 30 Minutes Intravenous Every 8 hours 12/15/16 1510     12/13/16 1130  vancomycin (VANCOCIN) IVPB 1000 mg/200 mL premix  Status:  Discontinued     1,000 mg 200 mL/hr over 60 Minutes Intravenous Every 12 hours 12/13/16 1119 12/15/16 1218   12/13/16 0600  vancomycin (VANCOCIN) IVPB 1000 mg/200 mL premix  Status:  Discontinued     1,000 mg 200 mL/hr over 60 Minutes Intravenous Every 12 hours 12/12/16 1357 12/13/16 1119   12/12/16 2330  piperacillin-tazobactam (ZOSYN) IVPB 3.375 g  Status:  Discontinued     3.375 g 12.5 mL/hr over 240 Minutes Intravenous Every 8 hours 12/12/16 2305 12/15/16 1510   12/12/16 1345  vancomycin (VANCOCIN) 2,000 mg in sodium chloride 0.9 % 500 mL IVPB     2,000 mg 250 mL/hr over 120 Minutes Intravenous  Once 12/12/16 1338 12/12/16 1713        Subjective:   Jeffrey Murphy was seen and examined today.At the time of my encounter, patient is tearful and crying with pain during the dressing change. No fevers or chills.  Patient denies dizziness, chest pain, shortness of breath, abdominal pain, N/V/D/C, new weakness, numbess, tingling. No acute events overnight.    Objective:   Vitals:   12/15/16 2133 12/16/16 0430 12/16/16 0506 12/16/16 1106  BP: 123/80  111/72 (!) 122/91  Pulse: 84  73   Resp: 18  18   Temp: 98 F (36.7 C)  98.9 F (37.2 C)   TempSrc: Oral  Oral   SpO2: 97%  96%   Weight:  86.8 kg (191 lb 6.4 oz)    Height:        Intake/Output Summary (Last 24 hours) at 12/16/16 1212 Last data filed at 12/16/16 1973  Gross per 24 hour  Intake          2923.33 ml  Output                0 ml  Net          2923.33 ml     Wt Readings from Last 3 Encounters:  12/16/16 86.8 kg (191 lb 6.4 oz)  03/12/15 96.3 kg (212 lb 4 oz)  03/01/15 93.9 kg (207 lb)     Exam  General: Alert and oriented x 3, NAD, Tearful  HEENT:   Neck:   Cardiovascular: S1 S2  clear  Respiratory: CTAB  Gastrointestinal: Soft, nontender, nondistended, + bowel sounds  Ext: no cyanosis clubbing or edema  Neuro: No  new deficits  Skin: No rashes  Psych: Normal affect and demeanor, alert and oriented x3   GU: Deep wound in the left inguinal region   Data Reviewed:  I have personally reviewed following labs and imaging studies  Micro Results Recent Results (from the past 240 hour(s))  Culture, blood (Routine x 2)     Status: None (Preliminary result)   Collection Time: 12/12/16  1:18 PM  Result Value Ref Range Status   Specimen Description BLOOD RIGHT ANTECUBITAL  Final   Special Requests BOTTLES DRAWN AEROBIC AND ANAEROBIC 5CC  Final   Culture NO GROWTH 3 DAYS  Final   Report Status PENDING  Incomplete  Culture, blood (Routine x 2)     Status: None (Preliminary result)   Collection Time: 12/12/16  1:47 PM  Result Value Ref Range Status   Specimen Description BLOOD RIGHT FOREARM  Final   Special Requests BOTTLES DRAWN AEROBIC AND ANAEROBIC 5CC  Final   Culture NO GROWTH 3 DAYS  Final   Report Status PENDING  Incomplete  Urine culture     Status: Abnormal   Collection Time: 12/12/16  2:18 PM  Result Value Ref Range Status   Specimen Description URINE, RANDOM  Final   Special Requests NONE  Final   Culture MULTIPLE SPECIES PRESENT, SUGGEST RECOLLECTION (A)  Final   Report Status 12/13/2016 FINAL  Final  Aerobic/Anaerobic Culture (surgical/deep wound)     Status: None (Preliminary result)   Collection Time: 12/12/16  8:28 PM  Result Value Ref Range Status   Specimen Description ABSCESS  Final   Special Requests LEFT LINGUINAL PT ON VANC  Final   Gram Stain   Final    ABUNDANT WBC PRESENT, PREDOMINANTLY PMN ABUNDANT GRAM POSITIVE COCCI IN PAIRS IN CLUSTERS    Culture   Final    ABUNDANT STAPHYLOCOCCUS AUREUS NO ANAEROBES ISOLATED; CULTURE IN PROGRESS FOR 5 DAYS    Report Status PENDING  Incomplete   Organism ID, Bacteria STAPHYLOCOCCUS AUREUS   Final      Susceptibility   Staphylococcus aureus - MIC*    CIPROFLOXACIN >=8 RESISTANT Resistant     ERYTHROMYCIN <=0.25 SENSITIVE Sensitive     GENTAMICIN <=0.5 SENSITIVE Sensitive     OXACILLIN 0.5 SENSITIVE Sensitive     TETRACYCLINE <=1 SENSITIVE Sensitive     VANCOMYCIN <=0.5 SENSITIVE Sensitive     TRIMETH/SULFA <=10 SENSITIVE Sensitive     CLINDAMYCIN <=0.25 SENSITIVE Sensitive     RIFAMPIN <=0.5 SENSITIVE Sensitive     Inducible Clindamycin NEGATIVE Sensitive     * ABUNDANT STAPHYLOCOCCUS AUREUS    Radiology Reports Dg Chest 2 View  Result Date: 12/12/2016 CLINICAL DATA:  Fever. EXAM: CHEST  2 VIEW COMPARISON:  Radiographs of August 11, 2015. FINDINGS: The heart size and mediastinal contours are within normal limits. Both lungs are clear. No pneumothorax or pleural effusion is noted. The visualized skeletal structures are unremarkable. IMPRESSION: No active cardiopulmonary disease. Electronically Signed   By: Marijo Conception, M.D.   On: 12/12/2016 14:52   Ct Pelvis W Contrast  Result Date: 12/12/2016 CLINICAL DATA:  Groin abscess, swelling for 1-2 weeks.  Pain. EXAM: CT PELVIS WITH CONTRAST TECHNIQUE: Multidetector CT imaging of the pelvis was performed using the standard protocol following the bolus administration of intravenous contrast. CONTRAST:  157m ISOVUE-300 IOPAMIDOL (ISOVUE-300) INJECTION 61% COMPARISON:  None. FINDINGS: Urinary Tract: Area of scarring and cortical thinning in the visualized right kidney lower pole. Ureters are decompressed. Urinary bladder unremarkable.  Bowel: Visualized large and small bowel unremarkable. Moderate stool in the rectosigmoid colon. Vascular/Lymphatic: Aorta and iliac vessels are normal caliber. Reproductive:  No visible focal abnormality. Other: Large complex fluid collection noted in the left inguinal region and extending into the left side of the scrotum measures 10.0 x 8.9 x 5.6 cm, compatible with abscess. Surrounding stranding within the  subcutaneous soft tissues suggesting cellulitis. Moderate size right inguinal hernia containing fat. No free fluid or free air in the pelvis. Musculoskeletal: No acute bony abnormality. IMPRESSION: Large complex fluid collection in the left inguinal region and extending into the left side of the scrotum most compatible with large abscess. Surrounding inflammatory stranding in the subcutaneous soft tissues suggests cellulitis. Right inguinal hernia. Moderate stool in the rectosigmoid colon. Electronically Signed   By: Rolm Baptise M.D.   On: 12/12/2016 14:36    Lab Data:  CBC:  Recent Labs Lab 12/12/16 1318 12/12/16 1346 12/13/16 0614 12/14/16 0541 12/15/16 0419 12/16/16 0436  WBC 15.8*  --  12.8* 8.5 7.2 6.4  NEUTROABS 13.8*  --   --   --   --   --   HGB 13.8 15.0 11.1* 10.3* 10.7* 10.3*  HCT 40.4 44.0 33.2* 31.0* 32.6* 31.4*  MCV 84.0  --  84.9 84.0 84.9 85.6  PLT 271  --  230 236 267 607   Basic Metabolic Panel:  Recent Labs Lab 12/12/16 1318 12/12/16 1346 12/13/16 0614 12/14/16 0541 12/15/16 0419 12/16/16 0436  NA 127* 126* 134* 135 137 140  K 4.3 5.1 3.8 3.5 3.7 3.9  CL 89* 89* 97* 98* 100* 101  CO2 24  --  '23 27 28 26  '$ GLUCOSE 417* 438* 233* 252* 176* 275*  BUN 16 24* 6 5* 5* 11  CREATININE 0.82 0.70 0.48* 0.47* 0.54* 0.62  CALCIUM 8.9  --  8.1* 8.1* 8.4* 8.6*   GFR: Estimated Creatinine Clearance: 109.3 mL/min (by C-G formula based on SCr of 0.62 mg/dL). Liver Function Tests:  Recent Labs Lab 12/12/16 1318 12/13/16 0614  AST 16 12*  ALT 11* 9*  ALKPHOS 85 68  BILITOT 0.6 1.0  PROT 7.8 5.9*  ALBUMIN 2.6* 2.0*   No results for input(s): LIPASE, AMYLASE in the last 168 hours. No results for input(s): AMMONIA in the last 168 hours. Coagulation Profile:  Recent Labs Lab 12/12/16 1318 12/13/16 0614  INR 1.16 1.15   Cardiac Enzymes: No results for input(s): CKTOTAL, CKMB, CKMBINDEX, TROPONINI in the last 168 hours. BNP (last 3 results) No results  for input(s): PROBNP in the last 8760 hours. HbA1C: No results for input(s): HGBA1C in the last 72 hours. CBG:  Recent Labs Lab 12/15/16 0756 12/15/16 1158 12/15/16 1745 12/15/16 2136 12/16/16 0802  GLUCAP 162* 249* 289* 181* 208*   Lipid Profile: No results for input(s): CHOL, HDL, LDLCALC, TRIG, CHOLHDL, LDLDIRECT in the last 72 hours. Thyroid Function Tests: No results for input(s): TSH, T4TOTAL, FREET4, T3FREE, THYROIDAB in the last 72 hours. Anemia Panel: No results for input(s): VITAMINB12, FOLATE, FERRITIN, TIBC, IRON, RETICCTPCT in the last 72 hours. Urine analysis:    Component Value Date/Time   COLORURINE YELLOW 12/12/2016 [place] 12/12/2016 1418   LABSPEC 1.033 (H) 12/12/2016 1418   PHURINE 6.0 12/12/2016 1418   GLUCOSEU >=500 (A) 12/12/2016 1418   HGBUR NEGATIVE 12/12/2016 [place] 12/12/2016 [place] 12/12/2016 1418   PROTEINUR NEGATIVE 12/12/2016 1418   NITRITE NEGATIVE 12/12/2016 1418  LEUKOCYTESUR NEGATIVE 12/12/2016 1418     RAI,RIPUDEEP M.D. [facility] 12/16/2016, 12:12 PM  Pager: [phone] Between 7am to 7pm - call Pager - [phone]  After 7pm go to [email or web address] - password [email or web address]  Call night coverage person covering after 7pm

## 2016-12-16 NOTE — Progress Notes (Signed)
Inpatient Diabetes Program Recommendations  AACE/ADA: New Consensus Statement on Inpatient Glycemic Control (2015)  Target Ranges:  Prepandial:   less than 140 mg/dL      Peak postprandial:   less than 180 mg/dL (1-2 hours)      Critically ill patients:  140 - 180 mg/dL   Educated patient on insulin Vial and syringe method use at home. Reviewed all steps if insulin Vial including injecting air, drawing up, site selection, administration, disposal. Patient able to provide successful return demonstration. MD to give patient Rxs for insulin syringes.  Thanks,  Christena DeemShannon Haruki Arnold RN, MSN, [facility] Inpatient Diabetes Coordinator Team Pager [phone] (8a-5p)

## 2016-12-17 LAB — AEROBIC/ANAEROBIC CULTURE W GRAM STAIN (SURGICAL/DEEP WOUND)

## 2016-12-17 LAB — CULTURE, BLOOD (ROUTINE X 2)
CULTURE: NO GROWTH
Culture: NO GROWTH

## 2016-12-17 LAB — GLUCOSE, CAPILLARY
GLUCOSE-CAPILLARY: 115 mg/dL — AB (ref 65–99)
GLUCOSE-CAPILLARY: 146 mg/dL — AB (ref 65–99)

## 2016-12-17 MED ORDER — HYDROCODONE-ACETAMINOPHEN 5-325 MG PO TABS: 1.0000 | ORAL_TABLET | ORAL | 0 refills | Status: DC | PRN

## 2016-12-17 MED ORDER — CEPHALEXIN 500 MG PO CAPS: 500.0000 mg | ORAL_CAPSULE | Freq: Four times a day (QID) | ORAL | 0 refills | Status: DC

## 2016-12-17 MED ORDER — BLOOD GLUCOSE MONITOR KIT: PACK | 0 refills | Status: DC

## 2016-12-17 MED ORDER — INSULIN ASPART PROT & ASPART (70-30 MIX) 100 UNIT/ML ~~LOC~~ SUSP: 20.0000 [IU] | Freq: Two times a day (BID) | SUBCUTANEOUS | 11 refills | Status: DC

## 2016-12-17 MED ORDER — INSULIN SYRINGES (DISPOSABLE) U-100 0.5 ML MISC: 1 refills | Status: DC

## 2016-12-17 MED ORDER — LISINOPRIL 10 MG PO TABS: 10.0000 mg | ORAL_TABLET | Freq: Every day | ORAL | 3 refills | Status: DC

## 2016-12-17 NOTE — Discharge Summary (Signed)
Physician Discharge Summary   Patient ID: Jeffrey Murphy MRN: [id number] DOB/AGE: [birth year] 61 y.o.  Admit date: 12/12/2016 Discharge date: 12/17/2016  Primary Care Physician:  Stephens Shire, MD  Discharge Diagnoses:    . Inguinal abscess Status post incision and debridement . GERD (gastroesophageal reflux disease) . Hypertension . Anxiety   Diabetes mellitus type 2, uncontrolled   Consults:   Gen. Surgery  Diabetic coordinator  Recommendations for Outpatient Follow-up:  1. Home health nurse for wound care and dressing changes was arranged by case management 2. Please repeat CBC/BMET at next visit 3. Please follow blood/urine cultures till final   DIET: Carb modified diet    Allergies:  No Known Allergies   DISCHARGE MEDICATIONS: Current Discharge Medication List    START taking these medications   Details  blood glucose meter kit and supplies KIT Dispense based on patient and insurance preference. Use up to four times daily as directed. (FOR ICD-9 250.00, 250.01). Qty: 1 each, Refills: 0    cephALEXin (KEFLEX) 500 MG capsule Take 1 capsule (500 mg total) by mouth 4 (four) times daily. X 2 weeks Qty: 56 capsule, Refills: 0    HYDROcodone-acetaminophen (NORCO/VICODIN) 5-325 MG tablet Take 1-2 tablets by mouth every 4 (four) hours as needed for moderate pain. Qty: 20 tablet, Refills: 0    insulin aspart protamine- aspart (NOVOLOG MIX 70/30) (70-30) 100 UNIT/ML injection Inject 0.2 mLs (20 Units total) into the skin 2 (two) times daily with a meal. Use any generic insulin 70/30 available Qty: 10 mL, Refills: 11    Insulin Syringes, Disposable, U-100 0.5 ML MISC Use as directed for insulin Qty: 100 each, Refills: 1      CONTINUE these medications which have CHANGED   Details  lisinopril (PRINIVIL,ZESTRIL) 10 MG tablet Take 1 tablet (10 mg total) by mouth daily. Qty: 30 tablet, Refills: 3      CONTINUE these medications which have NOT CHANGED   Details   acetaminophen (TYLENOL) 325 MG tablet Take 650 mg by mouth every 6 (six) hours as needed for headache (headache).    ALPRAZolam (XANAX) 0.5 MG tablet Take 0.5 mg by mouth 3 (three) times daily. Refills: 0    aspirin EC 325 MG tablet Take 325 mg by mouth daily.      STOP taking these medications     ciprofloxacin (CIPRO) 500 MG tablet      metFORMIN (GLUCOPHAGE) 1000 MG tablet      predniSONE (DELTASONE) 20 MG tablet      traZODone (DESYREL) 50 MG tablet          Brief H and P: For complete details please refer to admission H and P, but in brief Patient is a 61 year old male with diabetes, hypertension, hyperlipidemia, arthritis presented with one-week history of increasing left groin mass initially described as a small boil in the scrotum which he drained and returned about 3-5 days prior to admission. Patient reported seeing his PCP, received ciprofloxacin for it, took for 3 days without any relief. Patient presented to ED. CT scan revealed large complex fluid collection in the left inguinal region extending into the left side of the scrotum. Surgery was consulted and patient was admitted for further workup.    Hospital Course:   Left inguinal cellulitis and abscess -MSSA - CT scan revealedlarge complex fluid collection in the left inguinal region and extending into the left side of the scrotum  - Gen. surgery was consulted, patient underwent I&D in the OR on 12/12/16 -  Antibiotics changed to IV Ancef per the wound cultures growing MSSA - HIV negative, blood cultures negative, wound cultures grew out MSSA - Patient now agreeable for home health nurse which will help him with the wound care and dressing changes.   Type II Diabetes, poorly controlled due to non compliance, infection - Hemoglobin A1c 12.6  - Placed diabetic coordinator consult and nutrition consult to teach him about diabetes and diet - Discussed in detail with the patient regarding insulin, he appears  to be very motivated. - increased insulin 70/30,  To 20 units twice a day with sliding scale insulinWith supplies including insulin syringes and lancets, glucometer   Hypertension  Controlled, continue lisinopril  Anxiety/ insomnia Continue home Xanax and desyrel   Polysubstance abuse - admits to cocaine and marijuana, last dose the night before the admission. - denies any alcohol over the last year. No tobacco use    Day of Discharge BP 123/79 (BP Location: Right Arm)   Pulse 84   Temp 97.4 F (36.3 C)   Resp 18   Ht 5' 8.75" (1.746 m)   Wt 88.1 kg (194 lb 4.8 oz)   SpO2 93%   BMI 28.90 kg/m   Physical Exam: General: Alert and awake oriented x3 not in any acute distress. HEENT: anicteric sclera, pupils reactive to light and accommodation CVS: S1-S2 clear no murmur rubs or gallops Chest: clear to auscultation bilaterally, no wheezing rales or rhonchi Abdomen: soft nontender, nondistended, normal bowel sounds Extremities: no cyanosis, clubbing or edema noted bilaterally Neuro: Cranial nerves II-XII intact, no focal neurological deficits   The results of significant diagnostics from this hospitalization (including imaging, microbiology, ancillary and laboratory) are listed below for reference.    LAB RESULTS: Basic Metabolic Panel:  Recent Labs Lab 12/15/16 0419 12/16/16 0436  NA 137 140  K 3.7 3.9  CL 100* 101  CO2 28 26  GLUCOSE 176* 275*  BUN 5* 11  CREATININE 0.54* 0.62  CALCIUM 8.4* 8.6*   Liver Function Tests:  Recent Labs Lab 12/12/16 1318 12/13/16 0614  AST 16 12*  ALT 11* 9*  ALKPHOS 85 68  BILITOT 0.6 1.0  PROT 7.8 5.9*  ALBUMIN 2.6* 2.0*   No results for input(s): LIPASE, AMYLASE in the last 168 hours. No results for input(s): AMMONIA in the last 168 hours. CBC:  Recent Labs Lab 12/12/16 1318  12/15/16 0419 12/16/16 0436  WBC 15.8*  < > 7.2 6.4  NEUTROABS 13.8*  --   --   --   HGB 13.8  < > 10.7* 10.3*  HCT 40.4  < > 32.6*  31.4*  MCV 84.0  < > 84.9 85.6  PLT 271  < > 267 280  < > = values in this interval not displayed. Cardiac Enzymes: No results for input(s): CKTOTAL, CKMB, CKMBINDEX, TROPONINI in the last 168 hours. BNP: Invalid input(s): POCBNP CBG:  Recent Labs Lab 12/16/16 2228 12/17/16 0751  GLUCAP 161* 115*    Significant Diagnostic Studies:  Dg Chest 2 View  Result Date: 12/12/2016 CLINICAL DATA:  Fever. EXAM: CHEST  2 VIEW COMPARISON:  Radiographs of August 11, 2015. FINDINGS: The heart size and mediastinal contours are within normal limits. Both lungs are clear. No pneumothorax or pleural effusion is noted. The visualized skeletal structures are unremarkable. IMPRESSION: No active cardiopulmonary disease. Electronically Signed   By: Marijo Conception, M.D.   On: 12/12/2016 14:52   Ct Pelvis W Contrast  Result Date: 12/12/2016 CLINICAL DATA:  Groin abscess, swelling for 1-2 weeks.  Pain. EXAM: CT PELVIS WITH CONTRAST TECHNIQUE: Multidetector CT imaging of the pelvis was performed using the standard protocol following the bolus administration of intravenous contrast. CONTRAST:  162m ISOVUE-300 IOPAMIDOL (ISOVUE-300) INJECTION 61% COMPARISON:  None. FINDINGS: Urinary Tract: Area of scarring and cortical thinning in the visualized right kidney lower pole. Ureters are decompressed. Urinary bladder unremarkable. Bowel: Visualized large and small bowel unremarkable. Moderate stool in the rectosigmoid colon. Vascular/Lymphatic: Aorta and iliac vessels are normal caliber. Reproductive:  No visible focal abnormality. Other: Large complex fluid collection noted in the left inguinal region and extending into the left side of the scrotum measures 10.0 x 8.9 x 5.6 cm, compatible with abscess. Surrounding stranding within the subcutaneous soft tissues suggesting cellulitis. Moderate size right inguinal hernia containing fat. No free fluid or free air in the pelvis. Musculoskeletal: No acute bony abnormality.  IMPRESSION: Large complex fluid collection in the left inguinal region and extending into the left side of the scrotum most compatible with large abscess. Surrounding inflammatory stranding in the subcutaneous soft tissues suggests cellulitis. Right inguinal hernia. Moderate stool in the rectosigmoid colon. Electronically Signed   By: KRolm BaptiseM.D.   On: 12/12/2016 14:36    2D ECHO:   Disposition and Follow-up: Discharge Instructions    Diet Carb Modified    Complete by:  As directed    Discharge instructions    Complete by:  As directed    Normal saline wet to dry dressings with Kerlix (use real Kerlix or cotton based packing 1'inch) to base, and be sure to get to the spaces that is undermined around the wound.  Does not have to be tight.  ABD or 4 x 4's, paper tape and then mesh panties.   It is VERY IMPORTANT that you follow up with a PCP on a regular basis.  Check your blood glucoses before each meal and at bedtime and maintain a log of your readings.  Bring this log with you when you follow up with your PCP so that he or she can adjust your insulin at your follow up visit.   Increase activity slowly    Complete by:  As directed        DISPOSITIONHome   DISCHARGE FOLLOW-UP Follow-up Information    BYERLY,FAERA, MD Follow up on 01/06/2017.   Specialty:  General Surgery Why:  Your appointment is at 10:30 AM, be at the office 30 minutes early for check in. Bring photo ID and insurance information. Contact information: [phone]       BStephens Shire MD. Schedule an appointment as soon as possible for a visit in 2 week(s).   Specialty:  Family Medicine Contact information: [address] [phone]            Time spent on Discharge: 25 minutes  Signed:   RAI,RIPUDEEP M.D. [facility] 12/17/2016, 12:01 PM Pager: [phone]

## 2017-08-11 ENCOUNTER — Emergency Department (HOSPITAL_COMMUNITY)
Admission: EM | Admit: 2017-08-11 | Discharge: 2017-08-12 | Disposition: A | Discharge status: Home / Self Care | Payer: Self-pay | Attending: Emergency Medicine | Admitting: Emergency Medicine

## 2017-08-11 ENCOUNTER — Encounter (HOSPITAL_COMMUNITY): Payer: Self-pay | Admitting: Emergency Medicine

## 2017-08-11 ENCOUNTER — Emergency Department (HOSPITAL_COMMUNITY): Payer: Self-pay

## 2017-08-11 DIAGNOSIS — R609 Edema, unspecified: Secondary | ICD-10-CM | POA: Insufficient documentation

## 2017-08-11 DIAGNOSIS — L03115 Cellulitis of right lower limb: Secondary | ICD-10-CM | POA: Insufficient documentation

## 2017-08-11 DIAGNOSIS — Z7982 Long term (current) use of aspirin: Secondary | ICD-10-CM | POA: Insufficient documentation

## 2017-08-11 DIAGNOSIS — I10 Essential (primary) hypertension: Secondary | ICD-10-CM | POA: Insufficient documentation

## 2017-08-11 DIAGNOSIS — Z96651 Presence of right artificial knee joint: Secondary | ICD-10-CM | POA: Insufficient documentation

## 2017-08-11 DIAGNOSIS — Z79899 Other long term (current) drug therapy: Secondary | ICD-10-CM | POA: Insufficient documentation

## 2017-08-11 DIAGNOSIS — E119 Type 2 diabetes mellitus without complications: Secondary | ICD-10-CM | POA: Insufficient documentation

## 2017-08-11 DIAGNOSIS — Z794 Long term (current) use of insulin: Secondary | ICD-10-CM | POA: Insufficient documentation

## 2017-08-11 LAB — CBC
HEMATOCRIT: 41.9 % (ref 39.0–52.0)
HEMOGLOBIN: 14.6 g/dL (ref 13.0–17.0)
MCH: 29.4 pg (ref 26.0–34.0)
MCHC: 34.8 g/dL (ref 30.0–36.0)
MCV: 84.3 fL (ref 78.0–100.0)
Platelets: 172 10*3/uL (ref 150–400)
RBC: 4.97 MIL/uL (ref 4.22–5.81)
RDW: 13.3 % (ref 11.5–15.5)
WBC: 8.7 10*3/uL (ref 4.0–10.5)

## 2017-08-11 LAB — BASIC METABOLIC PANEL
Anion gap: 11 (ref 5–15)
BUN: 11 mg/dL (ref 6–20)
CHLORIDE: 96 mmol/L — AB (ref 101–111)
CO2: 27 mmol/L (ref 22–32)
CREATININE: 0.77 mg/dL (ref 0.61–1.24)
Calcium: 9.5 mg/dL (ref 8.9–10.3)
GFR calc non Af Amer: >60 mL/min (ref >60)
Glucose, Bld: 325 mg/dL — ABNORMAL HIGH (ref 65–99)
Potassium: 3.8 mmol/L (ref 3.5–5.1)
Sodium: 134 mmol/L — ABNORMAL LOW (ref 135–145)

## 2017-08-11 MED ORDER — CEPHALEXIN 250 MG PO CAPS
500.0000 mg | ORAL_CAPSULE | Freq: Once | ORAL | Status: AC
  Administered 2017-08-11: 500 mg via ORAL
  Filled 2017-08-11: qty 2

## 2017-08-11 MED ORDER — RIVAROXABAN 15 MG PO TABS
15.0000 mg | ORAL_TABLET | Freq: Once | ORAL | Status: AC
  Administered 2017-08-11: 15 mg via ORAL
  Filled 2017-08-11: qty 1

## 2017-08-11 MED ORDER — FUROSEMIDE 20 MG PO TABS: 20.0000 mg | ORAL_TABLET | Freq: Every day | ORAL | 0 refills | Status: DC

## 2017-08-11 MED ORDER — CEPHALEXIN 500 MG PO CAPS: 500.0000 mg | ORAL_CAPSULE | Freq: Four times a day (QID) | ORAL | 0 refills | Status: DC

## 2017-08-11 NOTE — Discharge Instructions (Addendum)
Return tomorrow for venous Doppler test to make sure there is no blood clot in your legs. If a clot is found, you will be started on blood thinners.  Stay on a low-salt diet. Return to the emergency department if redness is spreading, or if you start running a fever. Using support hose may limit the amount of swelling that you have. Please make arrangements to find a new primary care provider. You may need to adjust your insulin dose to get better control of your blood sugar.

## 2017-08-11 NOTE — ED Provider Notes (Signed)
[name] Provider Note   CSN: [id number] Arrival date & time: 08/11/17  1941     History   Chief Complaint Chief Complaint  Patient presents with  . Leg Swelling    HPI Jeffrey Murphy is a 61 y.o. male.  The history is provided by the patient.  he has history of hypertension and diabetes. Over the last 4 days, he has noted swelling of both legs and some redness to the right leg. Right leg is also painful. Pain is worse with walking, better if he props his feet up. As a matter of fact, with his legs up, he is not having any pain at all. When walking, pain gets to 5/10. He does relate that he has a job where he has to stand all day. He denies chest pain, heaviness, tightness, pressure. He denies dyspnea. He denies fever or chills. He does relate that he has been urinating very frequently.  Past Medical History:  Diagnosis Date  . Abscess 12/2016   LEFT INGUINAL ABSCESS  . Arthritis   . Diabetes mellitus without complication ([place])   . Hypertension   . Wears glasses    reading    Patient Active Problem List   Diagnosis Date Noted  . Inguinal abscess 12/12/2016  . Diabetes mellitus with complication ([place]) 73/41/9379  . Sepsis ([place]) 12/12/2016  . Drug abuse ([place])   . Hyperglycemia   . GERD (gastroesophageal reflux disease) 04/19/2016  . Hypertension 04/19/2016  . Anxiety 04/19/2016  . Rupture of right quadriceps tendon 12/24/2014  . Quadriceps tendon rupture 12/24/2014    Past Surgical History:  Procedure Laterality Date  . COLONOSCOPY    . CYST REMOVAL NECK    . INGUINAL HERNIA REPAIR     right  . IRRIGATION AND DEBRIDEMENT ABSCESS Left 12/12/2016   Procedure: IRRIGATION AND DEBRIDEMENT ABSCESS;  Surgeon: Stark Klein, MD;  Location: [facility];  Service: General;  Laterality: Left;  . JOINT REPLACEMENT     right total knee  . PILONIDAL CYST EXCISION     back  . QUADRICEPS TENDON REPAIR Right 12/24/2014   Procedure: RIGHT QUADRICEP TENDON REPAIR ;  Surgeon:  Marianna Payment, MD;  Location: [facility];  Service: Orthopedics;  Laterality: Right;  . REPLACEMENT TOTAL KNEE         Home Medications    Prior to Admission medications   Medication Sig Start Date End Date Taking? Authorizing Provider  acetaminophen (TYLENOL) 325 MG tablet Take 650 mg by mouth every 6 (six) hours as needed for headache (headache).    [provider]  ALPRAZolam Duanne Moron) 0.5 MG tablet Take 0.5 mg by mouth 3 (three) times daily. 02/18/15   [provider]  aspirin EC 325 MG tablet Take 325 mg by mouth daily.    [provider]  blood glucose meter kit and supplies KIT Dispense based on patient and insurance preference. Use up to four times daily as directed. (FOR ICD-9 250.00, 250.01). 12/17/16   Murphy, Jeffrey K, MD  cephALEXin (KEFLEX) 500 MG capsule Take 1 capsule (500 mg total) by mouth 4 (four) times daily. X 2 weeks 12/17/16   Murphy, Jeffrey Emerald, MD  HYDROcodone-acetaminophen (NORCO/VICODIN) 5-325 MG tablet Take 1-2 tablets by mouth every 4 (four) hours as needed for moderate pain. 12/17/16   Murphy, Jeffrey K, MD  insulin aspart protamine- aspart (NOVOLOG MIX 70/30) (70-30) 100 UNIT/ML injection Inject 0.2 mLs (20 Units total) into the skin 2 (two) times daily with  a meal. Use any generic insulin 70/30 available 12/17/16   Murphy, Jeffrey Emerald, MD  Insulin Syringes, Disposable, U-100 0.5 ML MISC Use as directed for insulin 12/17/16   Murphy, Jeffrey K, MD  lisinopril (PRINIVIL,ZESTRIL) 10 MG tablet Take 1 tablet (10 mg total) by mouth daily. 12/17/16   Mendel Corning, MD    Family History No family history on file.  Social History Social History  Substance Use Topics  . Smoking status: Never Smoker  . Smokeless tobacco: Never Used  . Alcohol use Yes     Comment: 12/2016  STATES HE QUIT ALCOHOL     Allergies   Patient has no known allergies.   Review of Systems Review of Systems  All other systems reviewed and are  negative.    Physical Exam Updated Vital Signs BP (!) 115/95 (BP Location: Left Arm)   Pulse (!) 110   Temp 98.6 F (37 C) (Oral)   Resp 18   Ht '5\' 9"'$  (1.753 m)   Wt 83.9 kg (185 lb)   SpO2 99%   BMI 27.32 kg/m   Physical Exam  Nursing note and vitals reviewed.  61 year old male, resting comfortably and in no acute distress. Vital signs are significant for diastolic hypertension and for tachycardia. Oxygen saturation is 99%, which is normal. Head is normocephalic and atraumatic. PERRLA, EOMI. Oropharynx is clear. Neck is nontender and supple without adenopathy or JVD. Back is nontender and there is no CVA tenderness. There is trace presacral edema. Lungs are clear without rales, wheezes, or rhonchi. Chest is nontender. Heart has regular rate and rhythm without murmur. Abdomen is soft, flat, nontender without masses or hepatosplenomegaly and peristalsis is normoactive. Extremities have 2+ pitting edema, full range of motion is present. Right calf circumference is 1 cm greater than left calf circumference. There is mild erythema and warmth over the distal right lower leg. Skin is warm and dry without rash. Neurologic: Mental status is normal, cranial nerves are intact, there are no motor or sensory deficits.  ED Treatments / Results  Labs (all labs ordered are listed, but only abnormal results are displayed) Labs Reviewed  BASIC METABOLIC PANEL - Abnormal; Notable for the following:       Result Value   Sodium 134 (*)    Chloride 96 (*)    Glucose, Bld 325 (*)    All other components within normal limits  CBC    Radiology Dg Chest 2 View  Result Date: 08/11/2017 CLINICAL DATA:  Peripheral edema EXAM: CHEST  2 VIEW COMPARISON:  12/12/2016 FINDINGS: No acute consolidation or pleural effusion. Hyperinflation. Normal heart size. No pneumothorax. IMPRESSION: No active cardiopulmonary disease. Electronically Signed   By: Jeffrey Murphy M.D.   On: 08/11/2017 23:41     Procedures Procedures (including critical care time)  Medications Ordered in ED Medications  Rivaroxaban (XARELTO) tablet 15 mg (15 mg Oral Given 08/11/17 2339)  cephALEXin (KEFLEX) capsule 500 mg (500 mg Oral Given 08/11/17 2316)     Initial Impression / Assessment and Plan / ED Course  I have reviewed the triage vital signs and the nursing notes.  Pertinent labs & imaging results that were available during my care of the patient were reviewed by me and considered in my medical decision making (see chart for details).  Peripheral edema without other signs of heart failure. Will check chest x-ray. Probable low-grade cellulitis of the right leg. However, because of asymmetrical findings, will send for venous Doppler in the  morning. Is given a dose of rivaroxaban in the ED, also started on cephalexin. Laboratory workup is otherwise unremarkable. Normal WBC, normal renal function, normal hemoglobin. Glucose is 325. I suspect his polyuria is related to his poor glycemic control. Old records are reviewed, and he has no relevant past visits.  Chest x-ray shows no cardiomegaly or pulmonary vascular congestion. He is discharged with prescription for furosemide and cephalexin. Return precautions discussed.  Final Clinical Impressions(s) / ED Diagnoses   Final diagnoses:  Peripheral edema  Cellulitis of right lower leg    New Prescriptions New Prescriptions   CEPHALEXIN (KEFLEX) 500 MG CAPSULE    Take 1 capsule (500 mg total) by mouth 4 (four) times daily.   FUROSEMIDE (LASIX) 20 MG TABLET    Take 1 tablet (20 mg total) by mouth daily.     Delora Fuel, MD 96/43/83 [phone]

## 2017-08-11 NOTE — ED Triage Notes (Signed)
Reports having BLE swelling with pain in right lower ext.  Redness noted to RLE.

## 2017-08-11 NOTE — ED Notes (Signed)
plz call wife: Jaymason Ledesma if needed @ [phone]

## 2017-08-11 NOTE — ED Notes (Signed)
Patient transported to X-ray 

## 2017-08-12 ENCOUNTER — Ambulatory Visit (HOSPITAL_COMMUNITY)
Admission: RE | Admit: 2017-08-12 | Discharge: 2017-08-12 | Disposition: A | Discharge status: Home / Self Care | Payer: Self-pay | Source: Ambulatory Visit | Attending: Emergency Medicine | Admitting: Emergency Medicine

## 2017-08-12 DIAGNOSIS — R609 Edema, unspecified: Secondary | ICD-10-CM | POA: Insufficient documentation

## 2017-08-12 DIAGNOSIS — M79609 Pain in unspecified limb: Secondary | ICD-10-CM

## 2017-08-12 DIAGNOSIS — M7989 Other specified soft tissue disorders: Secondary | ICD-10-CM

## 2017-08-12 NOTE — ED Notes (Signed)
Pt verbalized understanding of d/c instructions and has no further questions. Pt is stable, A&Ox4, VSS.  

## 2017-08-12 NOTE — Progress Notes (Signed)
VASCULAR LAB PRELIMINARY  PRELIMINARY  PRELIMINARY  PRELIMINARY  Right lower extremity venous duplex completed.    Preliminary report:  There is no DVT or SVT noted in the right lower extremity.   Kamryn Messineo, RVT 08/12/2017, 8:32 AM

## 2017-11-28 ENCOUNTER — Ambulatory Visit (INDEPENDENT_AMBULATORY_CARE_PROVIDER_SITE_OTHER): Payer: Self-pay | Admitting: Physician Assistant

## 2017-11-28 ENCOUNTER — Other Ambulatory Visit: Payer: Self-pay

## 2017-11-28 ENCOUNTER — Encounter: Payer: Self-pay | Admitting: Physician Assistant

## 2017-11-28 VITALS — BP 130/80 | HR 123 | Temp 100.3°F | Resp 18 | Ht 69.0 in | Wt 190.2 lb

## 2017-11-28 DIAGNOSIS — J029 Acute pharyngitis, unspecified: Secondary | ICD-10-CM

## 2017-11-28 DIAGNOSIS — R059 Cough, unspecified: Secondary | ICD-10-CM

## 2017-11-28 DIAGNOSIS — R509 Fever, unspecified: Secondary | ICD-10-CM

## 2017-11-28 DIAGNOSIS — R05 Cough: Secondary | ICD-10-CM

## 2017-11-28 DIAGNOSIS — Z202 Contact with and (suspected) exposure to infections with a predominantly sexual mode of transmission: Secondary | ICD-10-CM

## 2017-11-28 DIAGNOSIS — Z7251 High risk heterosexual behavior: Secondary | ICD-10-CM

## 2017-11-28 LAB — POCT CBC
GRANULOCYTE PERCENT: 87.4 % — AB (ref 37–80)
HEMATOCRIT: 45.4 % (ref 43.5–53.7)
Hemoglobin: 16.1 g/dL (ref 14.1–18.1)
LYMPH, POC: 0.8 (ref 0.6–3.4)
MCH, POC: 28.9 pg (ref 27–31.2)
MCHC: 35.5 g/dL — AB (ref 31.8–35.4)
MCV: 81.5 fL (ref 80–97)
MID (CBC): 0.3 (ref 0–0.9)
MPV: 6 fL (ref 0–99.8)
PLATELET COUNT, POC: 204 10*3/uL (ref 142–424)
POC Granulocyte: 8.1 — AB (ref 2–6.9)
POC LYMPH %: 8.9 % — AB (ref 10–50)
POC MID %: 3.7 %M (ref 0–12)
RBC: 5.57 M/uL (ref 4.69–6.13)
RDW, POC: 13 %
WBC: 9.3 10*3/uL (ref 4.6–10.2)

## 2017-11-28 LAB — POCT RAPID STREP A (OFFICE): RAPID STREP A SCREEN: NEGATIVE

## 2017-11-28 MED ORDER — AZITHROMYCIN 250 MG PO TABS: ORAL_TABLET | ORAL | 0 refills | Status: DC

## 2017-11-28 MED ORDER — IBUPROFEN 200 MG PO TABS
600.0000 mg | ORAL_TABLET | Freq: Once | ORAL | Status: AC
  Administered 2017-11-28: 600 mg via ORAL

## 2017-11-28 MED ORDER — CEFTRIAXONE SODIUM 250 MG IJ SOLR
250.0000 mg | Freq: Once | INTRAMUSCULAR | Status: AC
  Administered 2017-11-28: 250 mg via INTRAMUSCULAR

## 2017-11-28 NOTE — Patient Instructions (Addendum)
We have given you a shot of rocephin in the office today and an oral prescription for azithromycin, which should cover any underlying gonorrhea/chlamydia or strep and most common sources of pneumonias.  We have collected a culture of the throat to rule out gonorrhea and chlamydia of the throat and also strep.  However, we did not test your blood for STDs as you can have this done at the health department for free.  I recommend contacting the health department today in scheduling this appointment to have your testing.  In the meantime, I recommend not engaging in any sexual intercourse until you have your results back.  He may use over-the-counter ibuprofen 600-800 mg  every 8 hours for pain and swelling.  Also recommend eating soft foods and liquids.  Please follow-up in office in 2 days for reevaluation.  Return sooner if any symptoms worsen or you develop new concerning symptoms.   Sore Throat When you have a sore throat, your throat may:  Hurt.  Burn.  Feel irritated.  Feel scratchy.  Many things can cause a sore throat, including:  An infection.  Allergies.  Dryness in the air.  Smoke or pollution.  Gastroesophageal reflux disease (GERD).  A tumor.  A sore throat can be the first sign of another sickness. It can happen with other problems, like coughing or a fever. Most sore throats go away without treatment. Follow these instructions at home:  Take over-the-counter medicines only as told by your doctor.  Drink enough fluids to keep your pee (urine) clear or pale yellow.  Rest when you feel you need to.  To help with pain, try: ? Sipping warm liquids, such as broth, herbal tea, or warm water. ? Eating or drinking cold or frozen liquids, such as frozen ice pops. ? Gargling with a salt-water mixture 3-4 times a day or as needed. To make a salt-water mixture, add -1 tsp of salt in 1 cup of warm water. Mix it until you cannot see the salt anymore. ? Sucking on hard candy or  throat lozenges. ? Putting a cool-mist humidifier in your bedroom at night. ? Sitting in the bathroom with the door closed for 5-10 minutes while you run hot water in the shower.  Do not use any tobacco products, such as cigarettes, chewing tobacco, and e-cigarettes. If you need help quitting, ask your doctor. Contact a doctor if:  You have a fever for more than 2-3 days.  You keep having symptoms for more than 2-3 days.  Your throat does not get better in 7 days.  You have a fever and your symptoms suddenly get worse. Get help right away if:  You have trouble breathing.  You cannot swallow fluids, soft foods, or your saliva.  You have swelling in your throat or neck that gets worse.  You keep feeling like you are going to throw up (vomit).  You keep throwing up. This information is not intended to replace advice given to you by your health care provider. Make sure you discuss any questions you have with your health care provider. Document Released: 08/02/2008 Document Revised: 06/19/2016 Document Reviewed: 08/14/2015 Elsevier Interactive Patient Education  2018 [employer]   IF you received an x-ray today, you will receive an invoice from [facility]. Please contact [facility] at [phone] with questions or concerns regarding your invoice.   IF you received labwork today, you will receive an invoice from [facility]. Please contact [facility] at [phone] with questions or concerns regarding your  invoice.   Our billing staff will not be able to assist you with questions regarding bills from these companies.  You will be contacted with the lab results as soon as they are available. The fastest way to get your results is to activate your My Chart account. Instructions are located on the last page of this paperwork. If you have not heard from [place] regarding the results in 2 weeks, please contact this office.

## 2017-11-28 NOTE — Progress Notes (Signed)
MRN: [id number] DOB: 01/09/1956  Subjective:   Jeffrey Murphy is a 61 y.o. male presenting for chief complaint of Sore Throat (x4days but has gonna worse in past two days, pt states it feels like blisters in throat ) and Cough (coughing up yellow mucus ) .  Has had a productive cough x 5 days with fever and chills. Then developed this real sore throat a few days a ago.Does not want to eat because it hurts so bad. Has associated fever and chills. His wife left him recently and he has been performing oral sex on with at least a dozen women in the past month. They are "ladies of the night." Cannot have penile intercourse due to ED. Denies inability to swallow, ear pain, shortness of breath, chest tightness, chest pain, lower leg swelling, myalgia, nausea, vomiting, abdominal pain and diarrhea. Not sure if he has been exposed to any STD. No asthma or COPD. Patient has had flu shot this season. Denies smoking. Of note, pt is an uncontrolled T2DM, takes novolog 70/30 20 units BID. He is looking for a new PCP but declines on diabetes testing until he has insurance. Does not check sugars at home.   Of note, pt mentions that he only has 300 dollars for this visit and if there is any testing he could have done at the health department he would prefer to do that. He just could not get in until 3 days from now so he wanted to at least get seen before then because he felt so bad.   Review of Systems  Genitourinary: Negative for dysuria, flank pain, frequency, hematuria and urgency.       Negative for penile discharge, penile pain, testicular swelling, testicular pain, and rectal pain.   Jeffrey Murphy has a current medication list which includes the following prescription(s): insulin aspart protamine- aspart, azithromycin, blood glucose meter kit and supplies, cephalexin, furosemide, insulin syringes (disposable), and lisinopril, and the following Facility-Administered Medications: ibuprofen. Also has No Known  Allergies.  Jeffrey Murphy  has a past medical history of Abscess (12/2016), Arthritis, Diabetes mellitus without complication ([place]), Hypertension, and Wears glasses. Also  has a past surgical history that includes Inguinal hernia repair; Pilonidal cyst excision; Cyst removal neck; Colonoscopy; Quadriceps tendon repair (Right, 12/24/2014); Replacement total knee; Joint replacement; and Irrigation and debridement abscess (Left, 12/12/2016).      Objective:   Vitals: BP 130/80   Pulse (!) 123   Temp 100.3 F (37.9 C) (Oral)   Resp 18   Ht '5\' 9"'$  (1.753 m)   Wt 190 lb 3.2 oz (86.3 kg)   SpO2 95%   BMI 28.09 kg/m   Physical Exam  Constitutional: He is oriented to person, place, and time. He appears well-developed and well-nourished. He appears distressed (appears uncomfortable sitting on exam table).  HENT:  Head: Normocephalic and atraumatic.  Mouth/Throat: Mucous membranes are normal. No trismus in the jaw. Uvula swelling (mild) present. No oropharyngeal exudate or tonsillar abscesses. Tonsillar exudate (bilateral tonsils completely covered with exudates ).  Tonsils are erythematous and 2-3+ bilaterally.  Eyes: Conjunctivae are normal.  Neck: Normal range of motion.  Pulmonary/Chest: Effort normal and breath sounds normal. He has no wheezes. He has no rhonchi. He has no rales.  Lymphadenopathy:       Head (right side): No submental, no submandibular, no tonsillar, no preauricular, no posterior auricular and no occipital adenopathy present.       Head (left side): No submental, no submandibular, no  tonsillar, no preauricular, no posterior auricular and no occipital adenopathy present.    He has cervical adenopathy.       Right cervical: Posterior cervical adenopathy present. No superficial cervical and no deep cervical adenopathy present.      Left cervical: Posterior cervical adenopathy present. No superficial cervical and no deep cervical adenopathy present.       Right: No supraclavicular  adenopathy present.       Left: No supraclavicular adenopathy present.  Neurological: He is alert and oriented to person, place, and time.  Skin: Skin is warm and dry.  Psychiatric: He has a normal mood and affect.  Vitals reviewed.   Results for orders placed or performed in visit on 11/28/17 (from the past 24 hour(s))  POCT rapid strep A     Status: None   Collection Time: 11/28/17  2:33 PM  Result Value Ref Range   Rapid Strep A Screen Negative Negative  POCT CBC     Status: Abnormal   Collection Time: 11/28/17  3:18 PM  Result Value Ref Range   WBC 9.3 4.6 - 10.2 K/uL   Lymph, poc 0.8 0.6 - 3.4   POC LYMPH PERCENT 8.9 (A) 10 - 50 %L   MID (cbc) 0.3 0 - 0.9   POC MID % 3.7 0 - 12 %M   POC Granulocyte 8.1 (A) 2 - 6.9   Granulocyte percent 87.4 (A) 37 - 80 %G   RBC 5.57 4.69 - 6.13 M/uL   Hemoglobin 16.1 14.1 - 18.1 g/dL   HCT, POC 45.4 43.5 - 53.7 %   MCV 81.5 80 - 97 fL   MCH, POC 28.9 27 - 31.2 pg   MCHC 35.5 (A) 31.8 - 35.4 g/dL   RDW, POC 13 %   Platelet Count, POC 204 142 - 424 K/uL   MPV 6.0 0 - 99.8 fL    Assessment and Plan :  1. Sore throat - POCT rapid strep A - POCT CBC - Ct/GC NAA, Pharyngeal - Culture, Group A Strep - ibuprofen (ADVIL,MOTRIN) tablet 600 mg 2. Acute pharyngitis, unspecified etiology - cefTRIAXone (ROCEPHIN) injection 250 mg - azithromycin (ZITHROMAX) 250 MG tablet; Take 2 tabs PO x 1 dose, then 1 tab PO QD x 4 days  Dispense: 6 tablet; Refill: 0 3. Cough - azithromycin (ZITHROMAX) 250 MG tablet; Take 2 tabs PO x 1 dose, then 1 tab PO QD x 4 days  Dispense: 6 tablet; Refill: 0 4. High risk heterosexual behavior 5. Possible exposure to STD - cefTRIAXone (ROCEPHIN) injection 250 mg - azithromycin (ZITHROMAX) 250 MG tablet; Take 2 tabs PO x 1 dose, then 1 tab PO QD x 4 days  Dispense: 6 tablet; Refill: 0 6. Fever, unspecified fever cause - ibuprofen (ADVIL,MOTRIN) tablet 600 mg  Pt appears like he does not feel well in office. He is  febrile.Tonsils with diffuse exudate. WBC wnl. Rapid strep test negative. Strep culture and GC/[place] pharyngeal culture pending. He also has productive cough, declines CXR. Lungs are CTAB. Will treat empirically for gonococcal pharyngitis, as he is high risk for STD. Abx should also cover potential underlying pneumonia. As he does not have insurance, strongly advised him to go to the health dept so he can have additional STD testing. He contacted their office to schedule this appointment. In the meantime, recommended he avoid all forms of sexual intercourse until results of STD testing return. Also recommended light meals, oral hydration, and OTC ibuprofen as prescribed prn for pain.  Return in 2 days for reevaluation or sooner if symptoms worsen. Given strict ED precautions.   Tenna Delaine, PA-C  [facility] 11/28/2017 4:08 PM

## 2017-11-30 ENCOUNTER — Ambulatory Visit (INDEPENDENT_AMBULATORY_CARE_PROVIDER_SITE_OTHER): Payer: Self-pay | Admitting: Physician Assistant

## 2017-11-30 ENCOUNTER — Other Ambulatory Visit: Payer: Self-pay

## 2017-11-30 ENCOUNTER — Emergency Department (HOSPITAL_COMMUNITY)
Admission: EM | Admit: 2017-11-30 | Discharge: 2017-12-01 | Disposition: A | Discharge status: Home / Self Care | Payer: Self-pay | Attending: Emergency Medicine | Admitting: Emergency Medicine

## 2017-11-30 ENCOUNTER — Encounter (HOSPITAL_COMMUNITY): Payer: Self-pay

## 2017-11-30 ENCOUNTER — Emergency Department (HOSPITAL_COMMUNITY): Payer: Self-pay

## 2017-11-30 ENCOUNTER — Encounter: Payer: Self-pay | Admitting: Physician Assistant

## 2017-11-30 VITALS — BP 130/88 | HR 117 | Temp 100.1°F | Resp 18 | Ht 68.7 in | Wt 184.2 lb

## 2017-11-30 DIAGNOSIS — Z794 Long term (current) use of insulin: Secondary | ICD-10-CM | POA: Insufficient documentation

## 2017-11-30 DIAGNOSIS — J029 Acute pharyngitis, unspecified: Secondary | ICD-10-CM | POA: Insufficient documentation

## 2017-11-30 DIAGNOSIS — Z202 Contact with and (suspected) exposure to infections with a predominantly sexual mode of transmission: Secondary | ICD-10-CM

## 2017-11-30 DIAGNOSIS — B379 Candidiasis, unspecified: Secondary | ICD-10-CM

## 2017-11-30 DIAGNOSIS — E119 Type 2 diabetes mellitus without complications: Secondary | ICD-10-CM | POA: Insufficient documentation

## 2017-11-30 DIAGNOSIS — E1165 Type 2 diabetes mellitus with hyperglycemia: Secondary | ICD-10-CM

## 2017-11-30 DIAGNOSIS — Z96651 Presence of right artificial knee joint: Secondary | ICD-10-CM | POA: Insufficient documentation

## 2017-11-30 DIAGNOSIS — R509 Fever, unspecified: Secondary | ICD-10-CM

## 2017-11-30 DIAGNOSIS — R110 Nausea: Secondary | ICD-10-CM | POA: Insufficient documentation

## 2017-11-30 DIAGNOSIS — R059 Cough, unspecified: Secondary | ICD-10-CM

## 2017-11-30 DIAGNOSIS — Z7251 High risk heterosexual behavior: Secondary | ICD-10-CM

## 2017-11-30 DIAGNOSIS — B372 Candidiasis of skin and nail: Secondary | ICD-10-CM | POA: Insufficient documentation

## 2017-11-30 DIAGNOSIS — R05 Cough: Secondary | ICD-10-CM | POA: Insufficient documentation

## 2017-11-30 DIAGNOSIS — B370 Candidal stomatitis: Secondary | ICD-10-CM

## 2017-11-30 DIAGNOSIS — I10 Essential (primary) hypertension: Secondary | ICD-10-CM | POA: Insufficient documentation

## 2017-11-30 LAB — CBC WITH DIFFERENTIAL/PLATELET
BASOS ABS: 0.2 10*3/uL — AB (ref 0.0–0.1)
Basophils Relative: 2 %
EOS ABS: 0.2 10*3/uL (ref 0.0–0.7)
Eosinophils Relative: 2 %
HCT: 47 % (ref 39.0–52.0)
HEMOGLOBIN: 15.7 g/dL (ref 13.0–17.0)
LYMPHS PCT: 23 %
Lymphs Abs: 1.7 10*3/uL (ref 0.7–4.0)
MCH: 28.1 pg (ref 26.0–34.0)
MCHC: 33.4 g/dL (ref 30.0–36.0)
MCV: 84.1 fL (ref 78.0–100.0)
MONO ABS: 1 10*3/uL (ref 0.1–1.0)
Monocytes Relative: 13 %
NEUTROS ABS: 4.5 10*3/uL (ref 1.7–7.7)
NEUTROS PCT: 60 %
PLATELETS: 200 10*3/uL (ref 150–400)
RBC: 5.59 MIL/uL (ref 4.22–5.81)
RDW: 12.9 % (ref 11.5–15.5)
WBC: 7.6 10*3/uL (ref 4.0–10.5)

## 2017-11-30 LAB — CT/GC NAA, PHARYNGEAL
C TRACH RRNA NPH QL PCR: NEGATIVE
N GONORRHOEA RRNA NPH QL PCR: NEGATIVE

## 2017-11-30 LAB — COMPREHENSIVE METABOLIC PANEL
ALK PHOS: 91 U/L (ref 38–126)
ALT: 29 U/L (ref 17–63)
ANION GAP: 16 — AB (ref 5–15)
AST: 26 U/L (ref 15–41)
Albumin: 2.9 g/dL — ABNORMAL LOW (ref 3.5–5.0)
BUN: 18 mg/dL (ref 6–20)
CALCIUM: 8.8 mg/dL — AB (ref 8.9–10.3)
CO2: 27 mmol/L (ref 22–32)
Chloride: 90 mmol/L — ABNORMAL LOW (ref 101–111)
Creatinine, Ser: 0.9 mg/dL (ref 0.61–1.24)
Glucose, Bld: 296 mg/dL — ABNORMAL HIGH (ref 65–99)
Potassium: 3.6 mmol/L (ref 3.5–5.1)
Sodium: 133 mmol/L — ABNORMAL LOW (ref 135–145)
TOTAL PROTEIN: 8.5 g/dL — AB (ref 6.5–8.1)
Total Bilirubin: 0.9 mg/dL (ref 0.3–1.2)

## 2017-11-30 LAB — POCT GLYCOSYLATED HEMOGLOBIN (HGB A1C): HEMOGLOBIN A1C: 11.1

## 2017-11-30 LAB — RAPID HIV SCREEN (HIV 1/2 AB+AG)
HIV 1/2 Antibodies: NONREACTIVE
HIV-1 P24 Antigen - HIV24: NONREACTIVE

## 2017-11-30 LAB — I-STAT CG4 LACTIC ACID, ED
LACTIC ACID, VENOUS: 1.32 mmol/L (ref 0.5–1.9)
Lactic Acid, Venous: 2.05 mmol/L (ref 0.5–1.9)

## 2017-11-30 LAB — POCT SKIN KOH: SKIN KOH, POC: POSITIVE — AB

## 2017-11-30 LAB — CULTURE, GROUP A STREP: STREP A CULTURE: NEGATIVE

## 2017-11-30 MED ORDER — IOPAMIDOL (ISOVUE-300) INJECTION 61%
INTRAVENOUS | Status: AC
  Administered 2017-11-30: 75 mL
  Filled 2017-11-30: qty 75

## 2017-11-30 MED ORDER — SODIUM CHLORIDE 0.9 % IV BOLUS (SEPSIS)
1000.0000 mL | Freq: Once | INTRAVENOUS | Status: AC
  Administered 2017-11-30: 1000 mL via INTRAVENOUS

## 2017-11-30 MED ORDER — CLINDAMYCIN PHOSPHATE 600 MG/50ML IV SOLN
600.0000 mg | Freq: Once | INTRAVENOUS | Status: AC
  Administered 2017-11-30: 600 mg via INTRAVENOUS
  Filled 2017-11-30: qty 50

## 2017-11-30 MED ORDER — IBUPROFEN 200 MG PO TABS
600.0000 mg | ORAL_TABLET | Freq: Once | ORAL | Status: AC
  Administered 2017-11-30: 600 mg via ORAL

## 2017-11-30 MED ORDER — FLUCONAZOLE 100 MG PO TABS: ORAL_TABLET | ORAL | 0 refills | Status: DC

## 2017-11-30 MED ORDER — AMOXICILLIN 500 MG PO CAPS: 500.0000 mg | ORAL_CAPSULE | Freq: Two times a day (BID) | ORAL | 0 refills | Status: DC

## 2017-11-30 MED ORDER — ONDANSETRON HCL 4 MG/2ML IJ SOLN
4.0000 mg | Freq: Once | INTRAMUSCULAR | Status: AC
  Administered 2017-11-30: 4 mg via INTRAVENOUS
  Filled 2017-11-30: qty 2

## 2017-11-30 MED ORDER — HYDROCODONE-ACETAMINOPHEN 7.5-325 MG/15ML PO SOLN
10.0000 mL | Freq: Once | ORAL | Status: AC
  Administered 2017-11-30: 10 mL via ORAL
  Filled 2017-11-30: qty 15

## 2017-11-30 MED ORDER — DEXAMETHASONE SODIUM PHOSPHATE 10 MG/ML IJ SOLN
10.0000 mg | Freq: Once | INTRAMUSCULAR | Status: AC
  Administered 2017-11-30: 10 mg via INTRAVENOUS
  Filled 2017-11-30: qty 1

## 2017-11-30 NOTE — ED Triage Notes (Addendum)
Pt has sore throat, white patches present on his tonsils. Pt states he has been unable to eat due to sore throat. Sent here for further follow up for possible STD. Pt also states he is diabetic. Voice is clear, maintaining secretions. Negative strep recorded.

## 2017-11-30 NOTE — ED Provider Notes (Signed)
[facility] EMERGENCY DEPARTMENT Provider Note   CSN: [id number] Arrival date & time: 11/30/17  1828     History   Chief Complaint Chief Complaint  Patient presents with  . Sore Throat    HPI Jeffrey Murphy is a 62 y.o. male.  The history is provided by the patient and medical records.  Sore Throat     62 y.o. M with hx of DM, HTN, presenting to the ED for sore throat.  Patient reports his symptoms actually began 4 days ago but peaked about 2 days ago.  States he was seen by PCP and had rapid strep test that was negative as well as gc/chl cultures of the throat.  Patient has long-standing history of high risk sexual behavior, regularly sleeping with 12-15 different women in a month's time (oral sex).  States he has been doing this for years.  No hx of IVDU-- uses cocaine.  States he was treated with a shot of abx and a "pack of pills" (appears to be rocephin/azithromycin per chart review).  States he felt maybe 10% better but no significant improvement.  Reports he continues to feel like he cannot swallow and is intermittently spitting out his saliva because he gets "choked up".  States he is felt feverish but has not had a known fever.  He denies any vomiting but has had some nausea.  States he went back to his PCP office today as his symptoms did not really seem to be improving and was sent here for further evaluation.  He did have a KOH swab of the throat which was + for yeast.  Patient is a known diabetic, states his sugars have been elevated lately but this is in part due to the fact that he has not been able to afford his insulin.  He reports continued pain with swallowing, worse on the right side of his neck.  He has not had any shortness of breath or chest pain.  He denies any urinary symptoms or abdominal pain.  Past Medical History:  Diagnosis Date  . Abscess 12/2016   LEFT INGUINAL ABSCESS  . Arthritis   . Diabetes mellitus without complication ([place])   .  Hypertension   . Wears glasses    reading    Patient Active Problem List   Diagnosis Date Noted  . Inguinal abscess 12/12/2016  . Diabetes mellitus with complication ([place]) 54/00/8676  . Sepsis ([place]) 12/12/2016  . Drug abuse ([place])   . Hyperglycemia   . GERD (gastroesophageal reflux disease) 04/19/2016  . Hypertension 04/19/2016  . Anxiety 04/19/2016  . Rupture of right quadriceps tendon 12/24/2014  . Quadriceps tendon rupture 12/24/2014    Past Surgical History:  Procedure Laterality Date  . COLONOSCOPY    . CYST REMOVAL NECK    . INGUINAL HERNIA REPAIR     right  . IRRIGATION AND DEBRIDEMENT ABSCESS Left 12/12/2016   Procedure: IRRIGATION AND DEBRIDEMENT ABSCESS;  Surgeon: Stark Klein, MD;  Location: [facility];  Service: General;  Laterality: Left;  . JOINT REPLACEMENT     right total knee  . PILONIDAL CYST EXCISION     back  . QUADRICEPS TENDON REPAIR Right 12/24/2014   Procedure: RIGHT QUADRICEP TENDON REPAIR ;  Surgeon: Marianna Payment, MD;  Location: [facility];  Service: Orthopedics;  Laterality: Right;  . REPLACEMENT TOTAL KNEE         Home Medications    Prior to Admission medications   Medication Sig  Start Date End Date Taking? Authorizing Provider  azithromycin (ZITHROMAX) 250 MG tablet Take 2 tabs PO x 1 dose, then 1 tab PO QD x 4 days 11/28/17   Tenna Delaine D, PA-C  blood glucose meter kit and supplies KIT Dispense based on patient and insurance preference. Use up to four times daily as directed. (FOR ICD-9 250.00, 250.01). 12/17/16   Rai, Ripudeep K, MD  cephALEXin (KEFLEX) 500 MG capsule Take 1 capsule (500 mg total) by mouth 4 (four) times daily. Patient not taking: Reported on 7/35/3299 24/2/68   Delora Fuel, MD  furosemide (LASIX) 20 MG tablet Take 1 tablet (20 mg total) by mouth daily. Patient not taking: Reported on 3/41/9622 29/7/98   Delora Fuel, MD  insulin aspart protamine- aspart (NOVOLOG MIX 70/30) (70-30) 100 UNIT/ML  injection Inject 0.2 mLs (20 Units total) into the skin 2 (two) times daily with a meal. Use any generic insulin 70/30 available Patient taking differently: Inject 15 Units into the skin daily with breakfast.  12/17/16   Rai, Vernelle Emerald, MD  Insulin Syringes, Disposable, U-100 0.5 ML MISC Use as directed for insulin 12/17/16   Rai, Ripudeep K, MD  lisinopril (PRINIVIL,ZESTRIL) 10 MG tablet Take 1 tablet (10 mg total) by mouth daily. Patient not taking: Reported on 11/28/2017 12/17/16   Mendel Corning, MD    Family History Family History  Problem Relation Age of Onset  . Cancer Mother   . Hypertension Mother   . Diabetes Father     Social History Social History   Tobacco Use  . Smoking status: Never Smoker  . Smokeless tobacco: Never Used  Substance Use Topics  . Alcohol use: Yes    Comment: 12/2016  STATES HE QUIT ALCOHOL  . Drug use: Yes    Frequency: 5.0 times per week    Types: Marijuana, Cocaine, "Crack" cocaine    Comment: last used yesterday      Allergies   Patient has no known allergies.   Review of Systems Review of Systems  HENT: Positive for sore throat and trouble swallowing.   All other systems reviewed and are negative.    Physical Exam Updated Vital Signs BP 115/83   Pulse 98   Temp 98.4 F (36.9 C) (Oral)   Resp 20   SpO2 98%   Physical Exam  Constitutional: He is oriented to person, place, and time. He appears well-developed and well-nourished.  HENT:  Head: Normocephalic and atraumatic.  Right Ear: Tympanic membrane and ear canal normal.  Left Ear: Tympanic membrane and ear canal normal.  Nose: Nose normal.  Mouth/Throat: Uvula is midline. Mucous membranes are dry. Oropharyngeal exudate and posterior oropharyngeal erythema present.  Tonsils edematous bilaterally, right > left; large amount of exudates; uvula appears deviated to the left slightly; intermittently spitting into cup during exam but is able to swallow at other times; hot potato  voice  Eyes: Conjunctivae and EOM are normal. Pupils are equal, round, and reactive to light.  Neck: Normal range of motion. No thyromegaly present.  Cardiovascular: Normal rate, regular rhythm and normal heart sounds.  Pulmonary/Chest: Effort normal and breath sounds normal.  Abdominal: Soft. Bowel sounds are normal.  Musculoskeletal: Normal range of motion.  Neurological: He is alert and oriented to person, place, and time.  Skin: Skin is warm and dry.  Psychiatric: He has a normal mood and affect.  Nursing note and vitals reviewed.    ED Treatments / Results  Labs (all labs ordered are listed, but  only abnormal results are displayed) Labs Reviewed  COMPREHENSIVE METABOLIC PANEL - Abnormal; Notable for the following components:      Result Value   Sodium 133 (*)    Chloride 90 (*)    Glucose, Bld 296 (*)    Calcium 8.8 (*)    Total Protein 8.5 (*)    Albumin 2.9 (*)    Anion gap 16 (*)    All other components within normal limits  CBC WITH DIFFERENTIAL/PLATELET - Abnormal; Notable for the following components:   Basophils Absolute 0.2 (*)    All other components within normal limits  URINALYSIS, ROUTINE W REFLEX MICROSCOPIC - Abnormal; Notable for the following components:   Specific Gravity, Urine >1.046 (*)    Glucose, UA >=500 (*)    Ketones, ur 5 (*)    Protein, ur 30 (*)    Squamous Epithelial / LPF 0-5 (*)    All other components within normal limits  I-STAT CG4 LACTIC ACID, ED - Abnormal; Notable for the following components:   Lactic Acid, Venous 2.05 (*)    All other components within normal limits  RAPID HIV SCREEN (HIV 1/2 AB+AG)  RPR  I-STAT CG4 LACTIC ACID, ED  GC/CHLAMYDIA PROBE AMP ([place]) NOT AT [facility]    EKG  EKG Interpretation None       Radiology Dg Chest 2 View  Result Date: 12/01/2017 CLINICAL DATA:  Question pneumonia on CT of the neck. EXAM: CHEST  2 VIEW COMPARISON:  Chest 08/11/2017, CT neck 11/30/2017 FINDINGS: Normal heart size  and pulmonary vascularity. Lungs appear clear and expanded on plain film but review of the previous neck CT does demonstrate patchy airspace disease likely indicating bronchopneumonia. Consider follow-up chest radiograph as symptoms indicate. No pleural effusions. No pneumothorax. Mediastinal contours appear intact. Degenerative changes in the spine and shoulders. Old right rib fractures. IMPRESSION: Infiltrates demonstrated on previous CT neck are not visualized on plain radiograph. Based on the CT appearance, bronchopneumonia is likely. Consider follow-up as clinically indicated. Electronically Signed   By: Lucienne Capers M.D.   On: 12/01/2017 01:14   Ct Soft Tissue Neck W Contrast  Result Date: 12/01/2017 CLINICAL DATA:  Initial evaluation for acute sore throat EXAM: CT NECK WITH CONTRAST TECHNIQUE: Multidetector CT imaging of the neck was performed using the standard protocol following the bolus administration of intravenous contrast. CONTRAST:  21m ISOVUE-300 IOPAMIDOL (ISOVUE-300) INJECTION 61% COMPARISON:  None available. FINDINGS: Pharynx and larynx: Oral cavity within normal limits without mass lesion or loculated fluid collection. Dental carie with periapical lucency noted at the right second mandibular molar. No acute inflammatory changes about the dentition. Palatine tonsils are enlarged and edematous in appearance bilaterally, suggesting acute tonsillitis. Tonsils abut at the midline. No discrete tonsillar or peritonsillar abscess. Adenoidal soft tissues somewhat prominent as well. Vallecula is clear. Epiglottis relatively thin and within normal limits at this time. No retropharyngeal swelling or effusion. Remainder of the hypopharynx and supraglottic larynx within normal limits. True cords symmetric and normal. Subglottic airway clear. Salivary glands: Left parotid gland within normal limits. Right parotid gland appears to be absent. Submandibular glands are normal. Thyroid: Thyroid normal.  Lymph nodes: Enlarged bilateral level II lymph nodes measure up to 15 mm bilaterally. Level III nodes measure up to 12-13 mm bilaterally. Level V nodes measure up to 17 mm on the right and 10 mm on the left. Note made of a mildly enlarged right paratracheal node measuring 12 mm (series 3, image 124). Vascular: Normal intravascular  enhancement seen throughout the neck. Carotid bifurcation atherosclerosis with resultant moderate stenosis bilaterally. Limited intracranial: Unremarkable. Visualized orbits: Visualized globes and orbital soft tissues within normal limits. Mastoids and visualized paranasal sinuses: Moderate left greater than right maxillary sinus mucosal thickening, chronic in appearance. Visualized paranasal sinuses are otherwise clear. Mastoid air cells and middle ear cavities are well pneumatized and clear. Skeleton: No acute osseus abnormality. No worrisome lytic or blastic osseous lesions. Anterior osteophytic spurring noted throughout the cervical spine. Upper chest: Circumferential wall thickening noted about the visualized upper esophagus. Scattered nodular ground-glass opacities noted within the posterior right upper lobe, suspicious for acute infectious pneumonitis. Visualized left lung is clear. Small pneumatocele noted within left upper lobe. Other: None. IMPRESSION: 1. Enlarged and edematous appearance of the palatine tonsils bilaterally, suggesting acute tonsillitis. No tonsillar or peritonsillar abscess. 2. Enlarged bilateral cervical adenopathy as above, which may be reactive. Clinical follow-up to resolution recommended. 3. Multifocal patchy ground-glass opacities within the visualized right upper lobe, suspicious for pneumonia. 4. Enlarged 12 mm right paratracheal node, which may be reactive. 5. Atherosclerotic change about the carotid bifurcations with resultant moderate stenosis bilaterally. 6. Prominent dental carie at the right second mandibular molar without acute inflammatory  change. Electronically Signed   By: Jeannine Boga M.D.   On: 12/01/2017 00:04    Procedures Procedures (including critical care time)  Medications Ordered in ED Medications  sodium chloride 0.9 % bolus 1,000 mL (0 mLs Intravenous Stopped 11/30/17 2308)  ondansetron (ZOFRAN) injection 4 mg (4 mg Intravenous Given 11/30/17 2233)  HYDROcodone-acetaminophen (HYCET) 7.5-325 mg/15 ml solution 10 mL (10 mLs Oral Given 11/30/17 2226)  dexamethasone (DECADRON) injection 10 mg (10 mg Intravenous Given 11/30/17 2233)  clindamycin (CLEOCIN) IVPB 600 mg (0 mg Intravenous Stopped 11/30/17 2304)  iopamidol (ISOVUE-300) 61 % injection (75 mLs  Contrast Given 11/30/17 2319)  ketorolac (TORADOL) 30 MG/ML injection 30 mg (30 mg Intravenous Given 12/01/17 0120)     Initial Impression / Assessment and Plan / ED Course  I have reviewed the triage vital signs and the nursing notes.  Pertinent labs & imaging results that were available during my care of the patient were reviewed by me and considered in my medical decision making (see chart for details).  62 year old male here with sore throat.  Has been seen by PCP x2 this week and sent here due to worsening symptoms.  Patient has high risk sexual behavior (multple oral sex partners) and is at risk for STD.  He had chlamydia and gonococcal culture of his throat that were negative, rapid strep was also negative.  He had a KOH swab which did show yeast in the throat.  On exam he has tonsillar edema which is somewhat asymmetric, right tonsil is larger than left and very slight uvular deviation.  Has a hot potato voice and is intermittently spitting into a cup but other times is swallowing normally.  He is not in any acute respiratory distress.  Screening labs sent from triage are overall reassuring, normal white blood cell count.  His initial lactic acid was slightly elevated at 2.15, but repeat is normal at 1.32.  Will send full STD panel including rapid HIV, RPR,  Gc/chl urethral.  Patient given IVF, abx, decadron, and IVF.  Patient improved after decadron and IVF.  He is requesting to eat.  His rapid HIV is negative, remainder of STD testing is pending.  CT of the neck reveals tonsillitis without peritonsillar abscess.  Does have some likely reactive lymphadenopathy.  Patient also has some groundglass opacities in the right upper lobe concerning for possible pneumonia.  He has had a cough.  Chest x-ray does not show any acute infiltrates.  Patient has tolerated soft diet and fluids here without issue.  Will d/c home on clindamycin for coverage of tonsillitis and possible CAP.  Also started on week long course of diflucan for oral candidiasis--- this is likely from his uncontrolled DM as he has not been on his insulin.  Will have him follow-up closely with his PCP.  Also given referral to ENT.  Discussed plan with patient, he acknowledged understanding and agreed with plan of care.  Return precautions given for new or worsening symptoms.  Final Clinical Impressions(s) / ED Diagnoses   Final diagnoses:  Pharyngitis, unspecified etiology  Cough  Yeast infection    ED Discharge Orders        Ordered    clindamycin (CLEOCIN) 150 MG capsule  3 times daily     12/01/17 0242    HYDROcodone-acetaminophen (HYCET) 7.5-325 mg/15 ml solution  Every 8 hours PRN     12/01/17 0242    fluconazole (DIFLUCAN) 150 MG tablet  Daily     12/01/17 0242       Larene Pickett, PA-C 12/01/17 [id number]    Noemi Chapel, MD 12/01/17 1037

## 2017-11-30 NOTE — Patient Instructions (Signed)
     IF you received an x-ray today, you will receive an invoice from [facility]. Please contact [facility] at [phone] with questions or concerns regarding your invoice.   IF you received labwork today, you will receive an invoice from [facility]. Please contact [facility] at [phone] with questions or concerns regarding your invoice.   Our billing staff will not be able to assist you with questions regarding bills from these companies.  You will be contacted with the lab results as soon as they are available. The fastest way to get your results is to activate your My Chart account. Instructions are located on the last page of this paperwork. If you have not heard from us regarding the results in 2 weeks, please contact this office.     

## 2017-11-30 NOTE — Progress Notes (Signed)
Jeffrey Murphy  MRN: [id number] DOB: 04/19/1956  Subjective:  Jeffrey Murphy is a 62 y.o. male seen in office today for a chief complaint of follow-up on illness.  Patient was initially seen on 11/28/17 for productive cough, fever, chills, and sore throat.  He was unsure if he had been exposed to an STD.  Has high risk sexual behavior.  Has performed oral sex on at least 94 "ladies of the night"in the past month.  Physical exam findings with 2+ bilateral exudative tonsils.  WBC 9.3 with left shift.  Rapid strep negative.  We collected GC/chlamydia pharyngeal swab and strep culture.  He was treated with injection of Rocephin 26m and azithromycin and told to follow-up with health department for full STD testing and follow-up here in 2 days for reevaluation. Today, he reports that he is not feeling better.   His throat is still very sore.  He has not eaten anything in 4 days due to the pain.  He is drinking some water but not much.  He has not been able to take ibuprofen due to the pain.  Notes that he tried to call the health department to make an appointment but they could not understand him over the phone.  Has associated productive cough, fever, fatigue, and chills.  Denies shortness of breath, chest pain, vomiting, and inability to swallow. In terms of diabetes, he has not been taking his insulin because he has not been eating. Notes he does feel pretty nauseous but he thinks it is because he has not eaten anything.  Denies dry mouth, abdominal pain, blurred vision, and urinary frequency. Of note, pt does report recent cocaine use. Denies IV drug use.   Review of Systems  Neurological: Negative for dizziness and light-headedness.    Patient Active Problem List   Diagnosis Date Noted  . Inguinal abscess 12/12/2016  . Diabetes mellitus with complication ([place] 022/48/2500 . Sepsis ([place] 12/12/2016  . Drug abuse ([place]   . Hyperglycemia   . GERD (gastroesophageal reflux disease) 04/19/2016  .  Hypertension 04/19/2016  . Anxiety 04/19/2016  . Rupture of right quadriceps tendon 12/24/2014  . Quadriceps tendon rupture 12/24/2014    Current Outpatient Medications on File Prior to Visit  Medication Sig Dispense Refill  . azithromycin (ZITHROMAX) 250 MG tablet Take 2 tabs PO x 1 dose, then 1 tab PO QD x 4 days 6 tablet 0  . blood glucose meter kit and supplies KIT Dispense based on patient and insurance preference. Use up to four times daily as directed. (FOR ICD-9 250.00, 250.01). 1 each 0  . insulin aspart protamine- aspart (NOVOLOG MIX 70/30) (70-30) 100 UNIT/ML injection Inject 0.2 mLs (20 Units total) into the skin 2 (two) times daily with a meal. Use any generic insulin 70/30 available (Patient taking differently: Inject 15 Units into the skin daily with breakfast. ) 10 mL 11  . Insulin Syringes, Disposable, U-100 0.5 ML MISC Use as directed for insulin 100 each 1  . cephALEXin (KEFLEX) 500 MG capsule Take 1 capsule (500 mg total) by mouth 4 (four) times daily. (Patient not taking: Reported on 11/28/2017) 40 capsule 0  . furosemide (LASIX) 20 MG tablet Take 1 tablet (20 mg total) by mouth daily. (Patient not taking: Reported on 11/28/2017) 30 tablet 0  . lisinopril (PRINIVIL,ZESTRIL) 10 MG tablet Take 1 tablet (10 mg total) by mouth daily. (Patient not taking: Reported on 11/28/2017) 30 tablet 3   No current facility-administered medications on file  prior to visit.     No Known Allergies   Objective:  BP 130/88 (BP Location: Right Arm, Patient Position: Sitting, Cuff Size: Normal)   Pulse (!) 117   Temp 100.1 F (37.8 C) (Oral)   Resp 18   Ht 5' 8.7" (1.745 m)   Wt 184 lb 3.2 oz (83.6 kg)   SpO2 95%   BMI 27.44 kg/m   Physical Exam  Constitutional: He is oriented to person, place, and time. He appears distressed ( appears like he does not feel well).  HENT:  Head: Normocephalic and atraumatic.  Mouth/Throat: Mucous membranes are dry. No trismus in the jaw. Uvula swelling  present. Posterior oropharyngeal erythema present.  Tonsils are erythematous, 3-4+ bilaterally, with diffuse exudates. There are white creamy lesions noted in oropharynx. See image below.   Thick muffled voice noted.   Eyes: Conjunctivae are normal.  Neck: Normal range of motion.  Pulmonary/Chest: Effort normal and breath sounds normal. No respiratory distress. He has no wheezes. He has no rhonchi. He has no rales.  Lymphadenopathy:       Head (right side): No submental, no submandibular, no tonsillar, no preauricular, no posterior auricular and no occipital adenopathy present.       Head (left side): No submental, no submandibular, no tonsillar, no preauricular, no posterior auricular and no occipital adenopathy present.    He has cervical adenopathy.       Right cervical: Posterior cervical adenopathy present. No superficial cervical and no deep cervical adenopathy present.      Left cervical: Posterior cervical adenopathy present. No superficial cervical and no deep cervical adenopathy present.       Right: No supraclavicular adenopathy present.       Left: No supraclavicular adenopathy present.  Neurological: He is alert and oriented to person, place, and time. Gait normal.  Skin: Skin is warm and dry.  Psychiatric: Affect normal.  Vitals reviewed.   Results for orders placed or performed in visit on 11/30/17 (from the past 24 hour(s))  POCT glycosylated hemoglobin (Hb A1C)     Status: None   Collection Time: 11/30/17  4:16 PM  Result Value Ref Range   Hemoglobin A1C 11.1   POCT Skin KOH     Status: Abnormal   Collection Time: 11/30/17  4:17 PM  Result Value Ref Range   Skin KOH, POC Positive (A) Negative        Assessment and Plan :  This case was precepted with Dr. Carlota Raspberry.  1. Sore throat - ibuprofen (ADVIL,MOTRIN) tablet 600 mg - POCT Skin KOH 2. Fever, unspecified fever cause 3. Oropharyngeal candidiasis 4. High risk heterosexual behavior 5. Possible exposure to  STD 6. Cough 7. Uncontrolled type 2 diabetes mellitus with hyperglycemia (HCC) - POCT glycosylated hemoglobin (Hb A1C)  Pt presents with worsening sore throat. He has continued fever and tachycardia. Swab collection of the tonsils today positive for yeast. Oropharyngeal candidia could be due to uncontrolled diabetes vs HIV? Strep and GC/chlamydia pharyngeal culture negative.  He cannot tolerate oral foods and is not drinking much water due to pain. We did give him some ibuprofen in office, which he could swallow successfully. Tonsils are 3-4+ bilaterally. Concern for airway compromise if they increase any more in size. Due to worsening sore throat, continued fever and tachycardia on abx, and physical exam findings, pt warrants further evaluation by the ED at this time. STD testing was not collected here as he was going to have it collected at the  health dept for free as he does not have insurance. However, he does warrant complete STD work up due to high risk sexual behavior. A1C uncontrolled at 11.1. He is not taking insulin due to not being able to eat. We did not obtain glucose in office as he was sent to ED for further evaluation and treatment. Pt appears stable to drive himself to ED, which he agrees to do so now.  Does not warrant EMS transport at this time.   Tenna Delaine PA-C  [facility] 11/30/2017 4:19 PM

## 2017-11-30 NOTE — ED Notes (Signed)
Patient transported to CT 

## 2017-11-30 NOTE — ED Notes (Signed)
Lab and radiology results reviewed, vital signs reviewed, nurse first aware of abnormal results.

## 2017-12-01 ENCOUNTER — Emergency Department (HOSPITAL_COMMUNITY): Payer: Self-pay

## 2017-12-01 LAB — URINALYSIS, ROUTINE W REFLEX MICROSCOPIC
Bacteria, UA: NONE SEEN
Bilirubin Urine: NEGATIVE
Hgb urine dipstick: NEGATIVE
KETONES UR: 5 mg/dL — AB
Leukocytes, UA: NEGATIVE
NITRITE: NEGATIVE
PH: 6 (ref 5.0–8.0)
Protein, ur: 30 mg/dL — AB

## 2017-12-01 LAB — GC/CHLAMYDIA PROBE AMP (~~LOC~~) NOT AT ARMC
Chlamydia: NEGATIVE
NEISSERIA GONORRHEA: NEGATIVE

## 2017-12-01 LAB — RPR: RPR Ser Ql: NONREACTIVE

## 2017-12-01 MED ORDER — KETOROLAC TROMETHAMINE 30 MG/ML IJ SOLN
30.0000 mg | Freq: Once | INTRAMUSCULAR | Status: AC
  Administered 2017-12-01: 30 mg via INTRAVENOUS
  Filled 2017-12-01: qty 1

## 2017-12-01 MED ORDER — HYDROCODONE-ACETAMINOPHEN 7.5-325 MG/15ML PO SOLN: 15.0000 mL | Freq: Three times a day (TID) | ORAL | 0 refills | Status: DC | PRN

## 2017-12-01 MED ORDER — FLUCONAZOLE 150 MG PO TABS: 150.0000 mg | ORAL_TABLET | Freq: Every day | ORAL | 0 refills | Status: DC

## 2017-12-01 MED ORDER — CLINDAMYCIN HCL 150 MG PO CAPS: 300.0000 mg | ORAL_CAPSULE | Freq: Three times a day (TID) | ORAL | 0 refills | Status: DC

## 2017-12-01 NOTE — ED Notes (Signed)
Attempted to get UA on patient, patient aware urine sample is needed-urinal given.

## 2017-12-01 NOTE — Discharge Instructions (Signed)
Take the prescribed medication as directed. Try to keep and eye on your blood sugar as it can run high when you have infections going on.   Can stick with soft diet if you have issues swallowing for now. Follow-up with your primary care doctor. Return to the ED for new or worsening symptoms.

## 2018-01-11 IMAGING — CT CT PELVIS W/ CM
2 of 3 series · 13 of 46 positions shown, 15 images · IV contrast (iopamidol)
Comparison: None.

CLINICAL DATA: Groin abscess, swelling for 1-2 weeks.  Pain.

EXAM:
CT PELVIS WITH CONTRAST
TECHNIQUE: Multidetector CT imaging of the pelvis was performed using the
standard protocol following the bolus administration of intravenous
contrast.
CONTRAST:  100mL BC6HHS-XLL IOPAMIDOL (BC6HHS-XLL) INJECTION 61%

[Series 201: routine, idose (2) · axial · 0.78mm/px · z∈[+97,+382]mm · 10 of 67 slices shown, 12 images]
[im 5/67  soft-tissue]
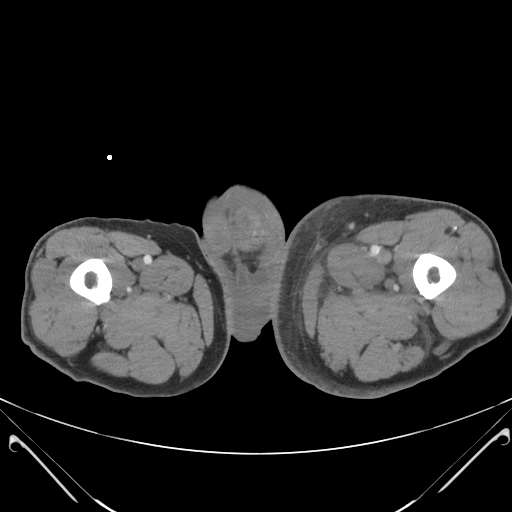
[im 5/67  bone]
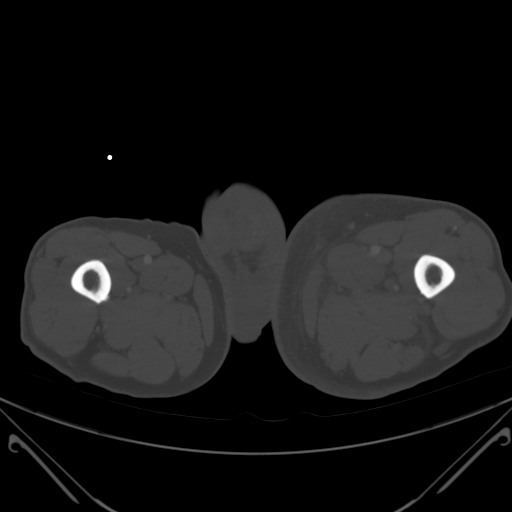
[im 11/67  soft-tissue]
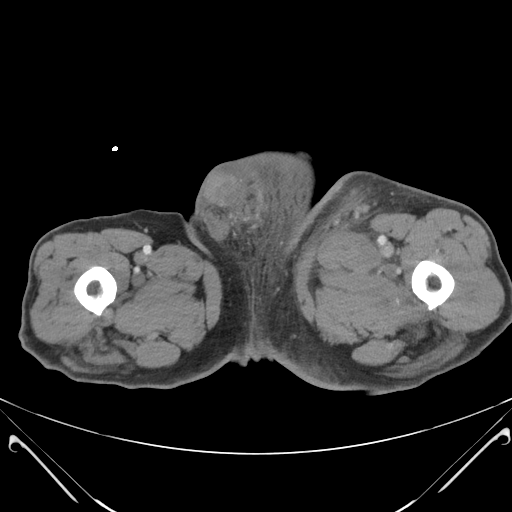
[im 18/67  soft-tissue]
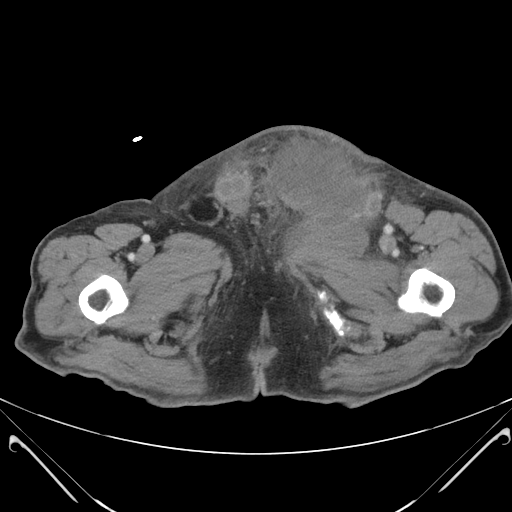
[im 24/67  soft-tissue]
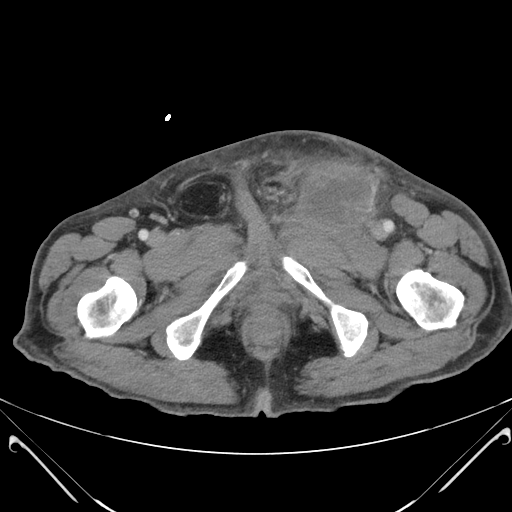
[im 30/67  soft-tissue]
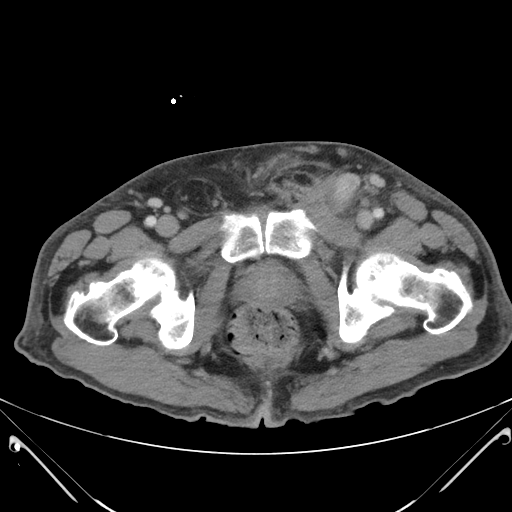
[im 37/67  soft-tissue]
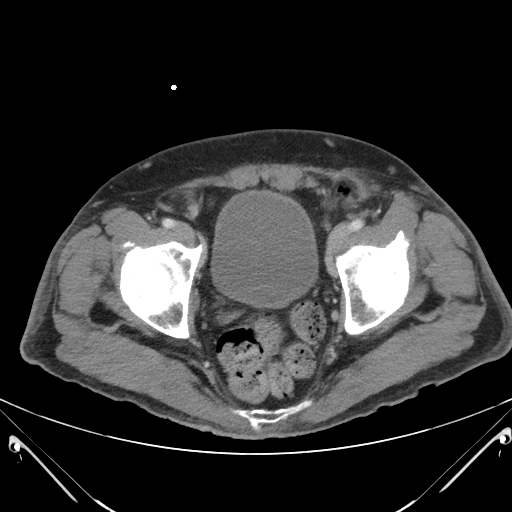
[im 43/67  soft-tissue]
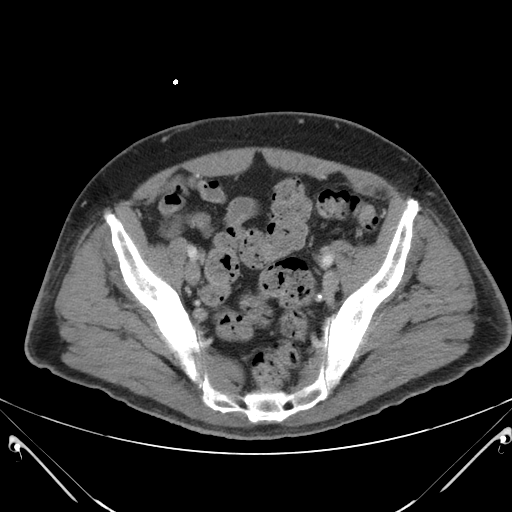
[im 49/67  soft-tissue]
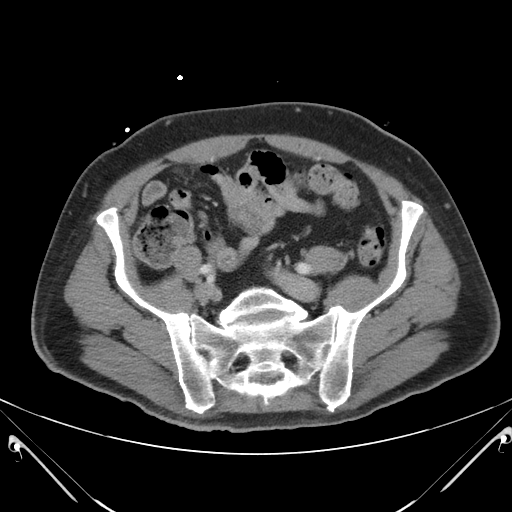
[im 56/67  soft-tissue]
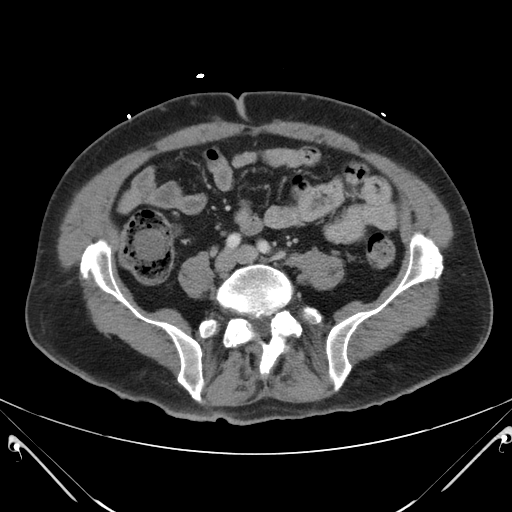
[im 56/67  bone]
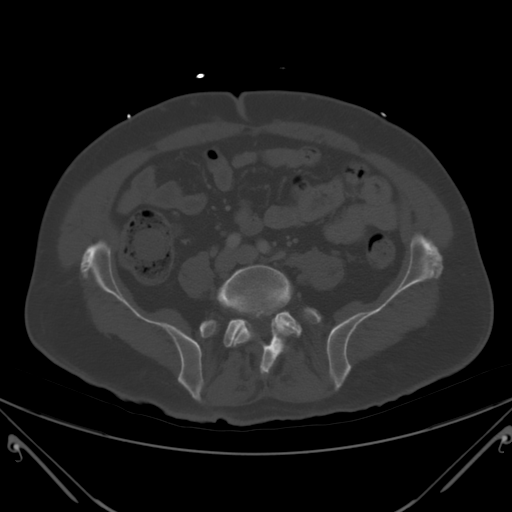
[im 62/67  soft-tissue]
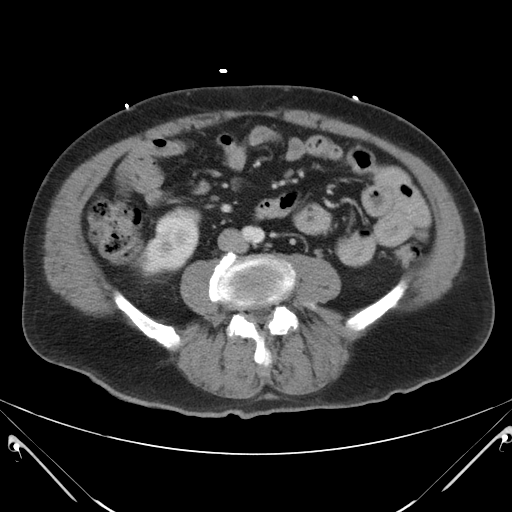

[Series 203: coronals, idose (2) · coronal · 0.45mm/px · 3 of 121 slices shown]
[im 41/121  soft-tissue]
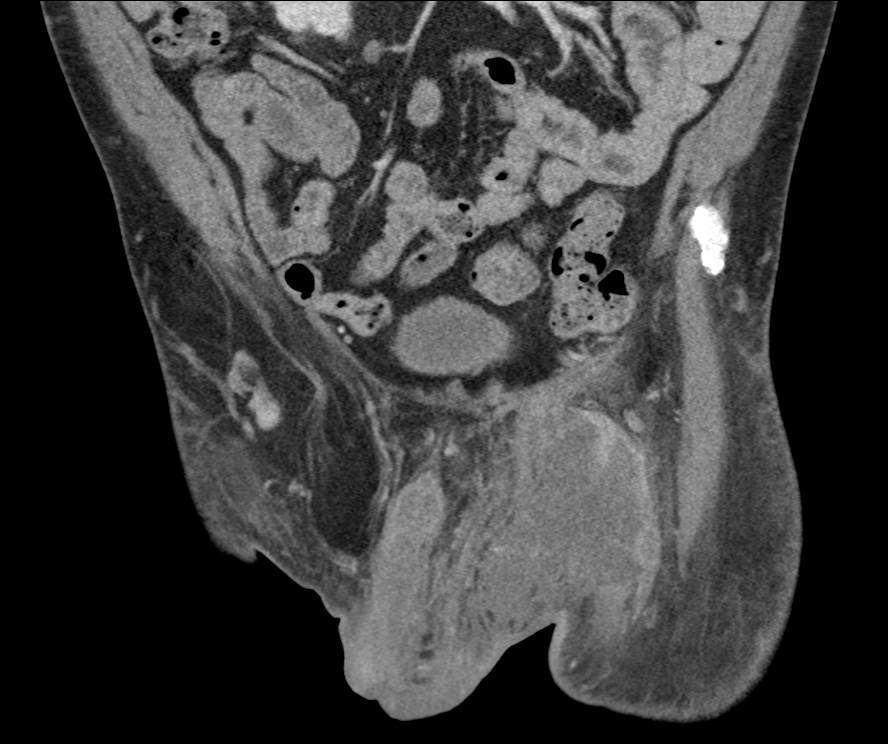
[im 54/121  soft-tissue]
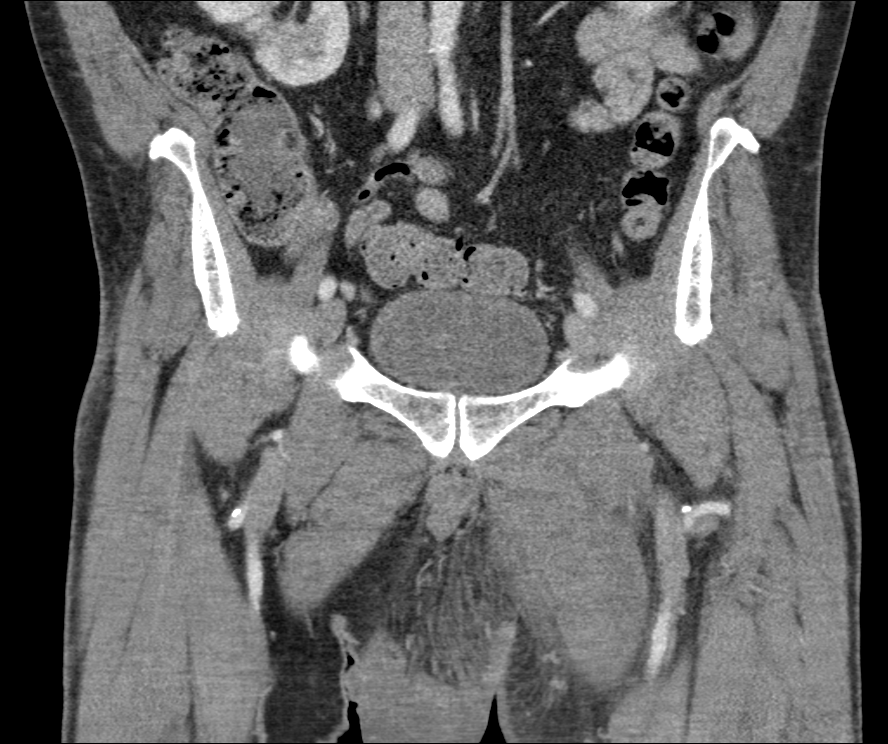
[im 67/121  soft-tissue]
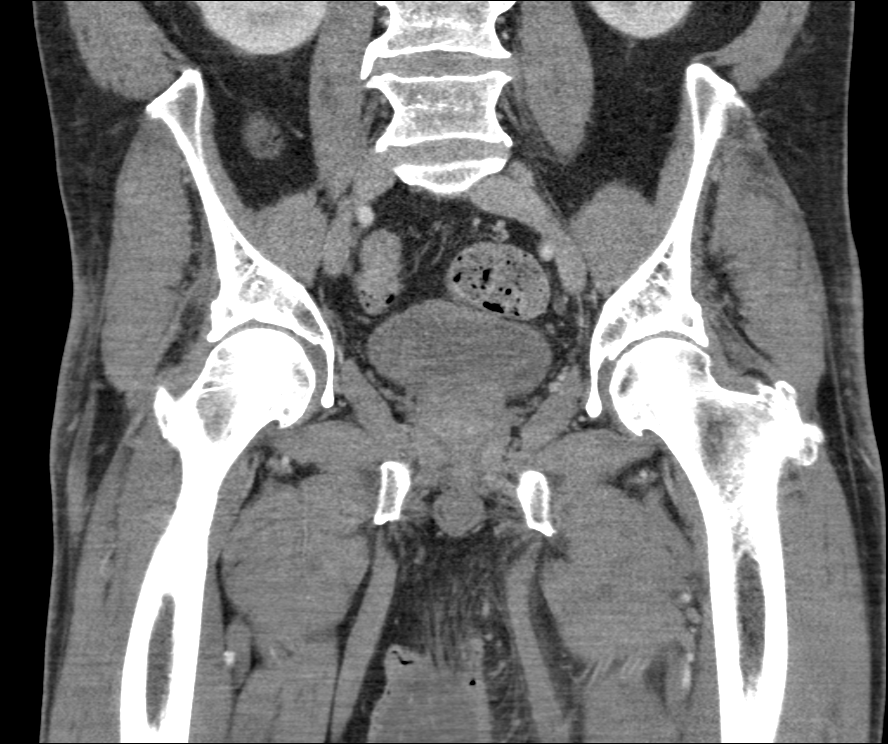

[13 of 46 positions shown; findings below may reference images not displayed]

FINDINGS: Urinary Tract: Area of scarring and cortical thinning in the
visualized right kidney lower pole. Ureters are decompressed.
Urinary bladder unremarkable.

Bowel: Visualized large and small bowel unremarkable. Moderate stool
in the rectosigmoid colon.

Vascular/Lymphatic: Aorta and iliac vessels are normal caliber.

Reproductive:  No visible focal abnormality.

Other: Large complex fluid collection noted in the left inguinal
region and extending into the left side of the scrotum measures
x 8.9 x 5.6 cm, compatible with abscess. Surrounding stranding
within the subcutaneous soft tissues suggesting cellulitis. Moderate
size right inguinal hernia containing fat. No free fluid or free air
in the pelvis.

Musculoskeletal: No acute bony abnormality.
IMPRESSION: Large complex fluid collection in the left inguinal region and
extending into the left side of the scrotum most compatible with
large abscess. Surrounding inflammatory stranding in the
subcutaneous soft tissues suggests cellulitis.

Right inguinal hernia.

Moderate stool in the rectosigmoid colon.

## 2018-01-11 IMAGING — DX DG CHEST 2V
3 series · 3 of 3 positions shown · non-contrast
Comparison: Radiographs August 11, 2015.

CLINICAL DATA: Fever.

EXAM:
CHEST  2 VIEW

[chest lat (1 of 2)]
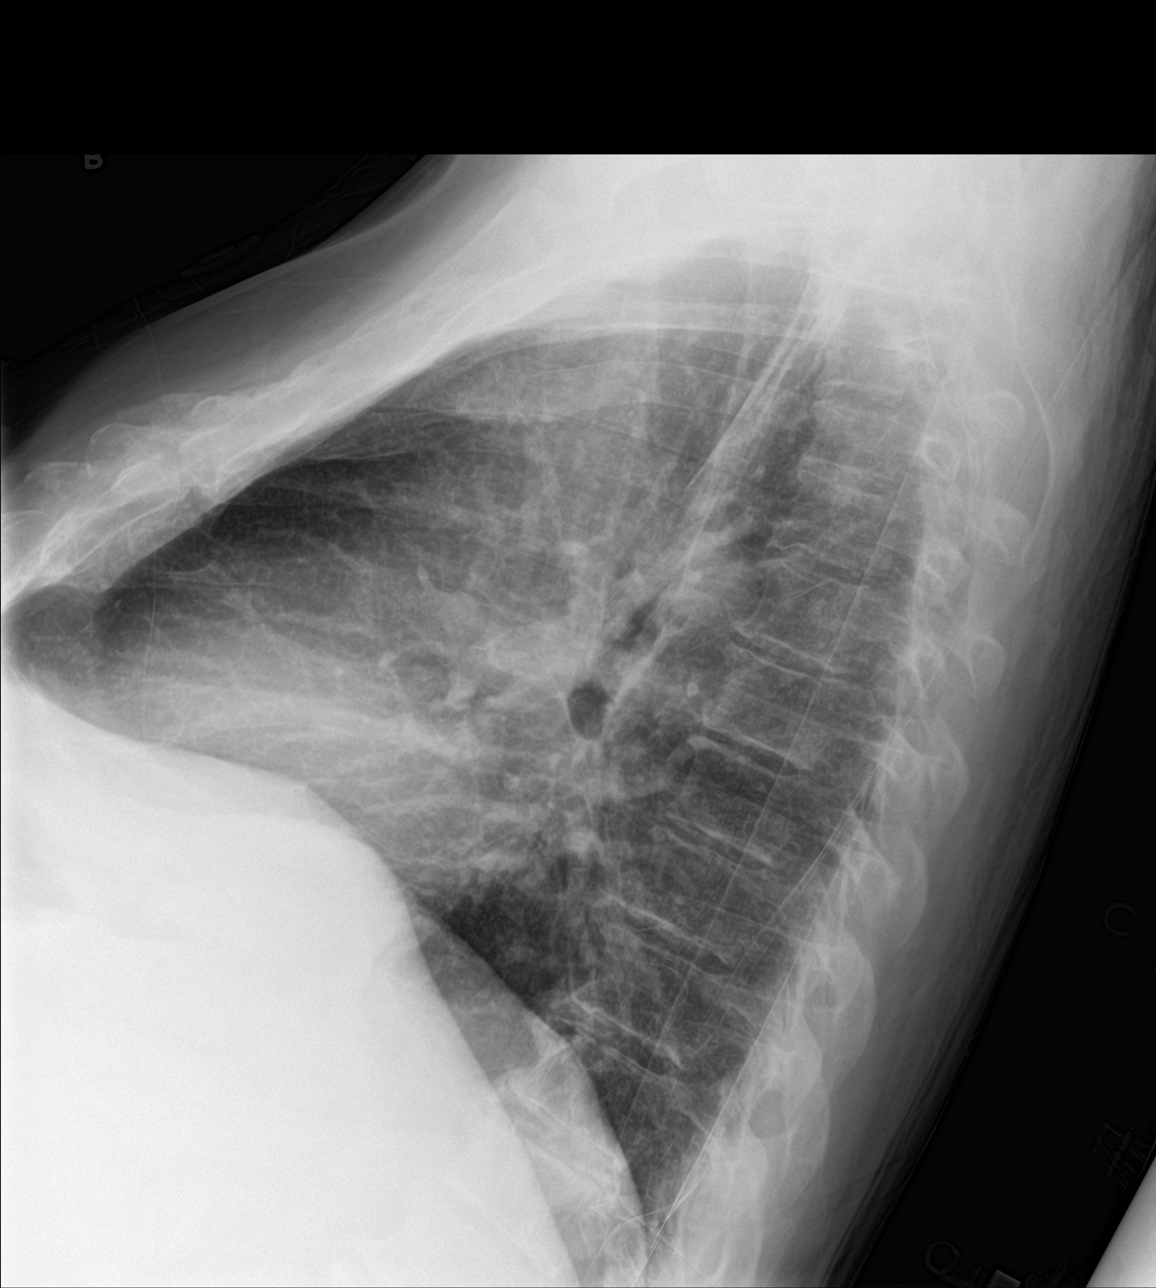

[chest ap]
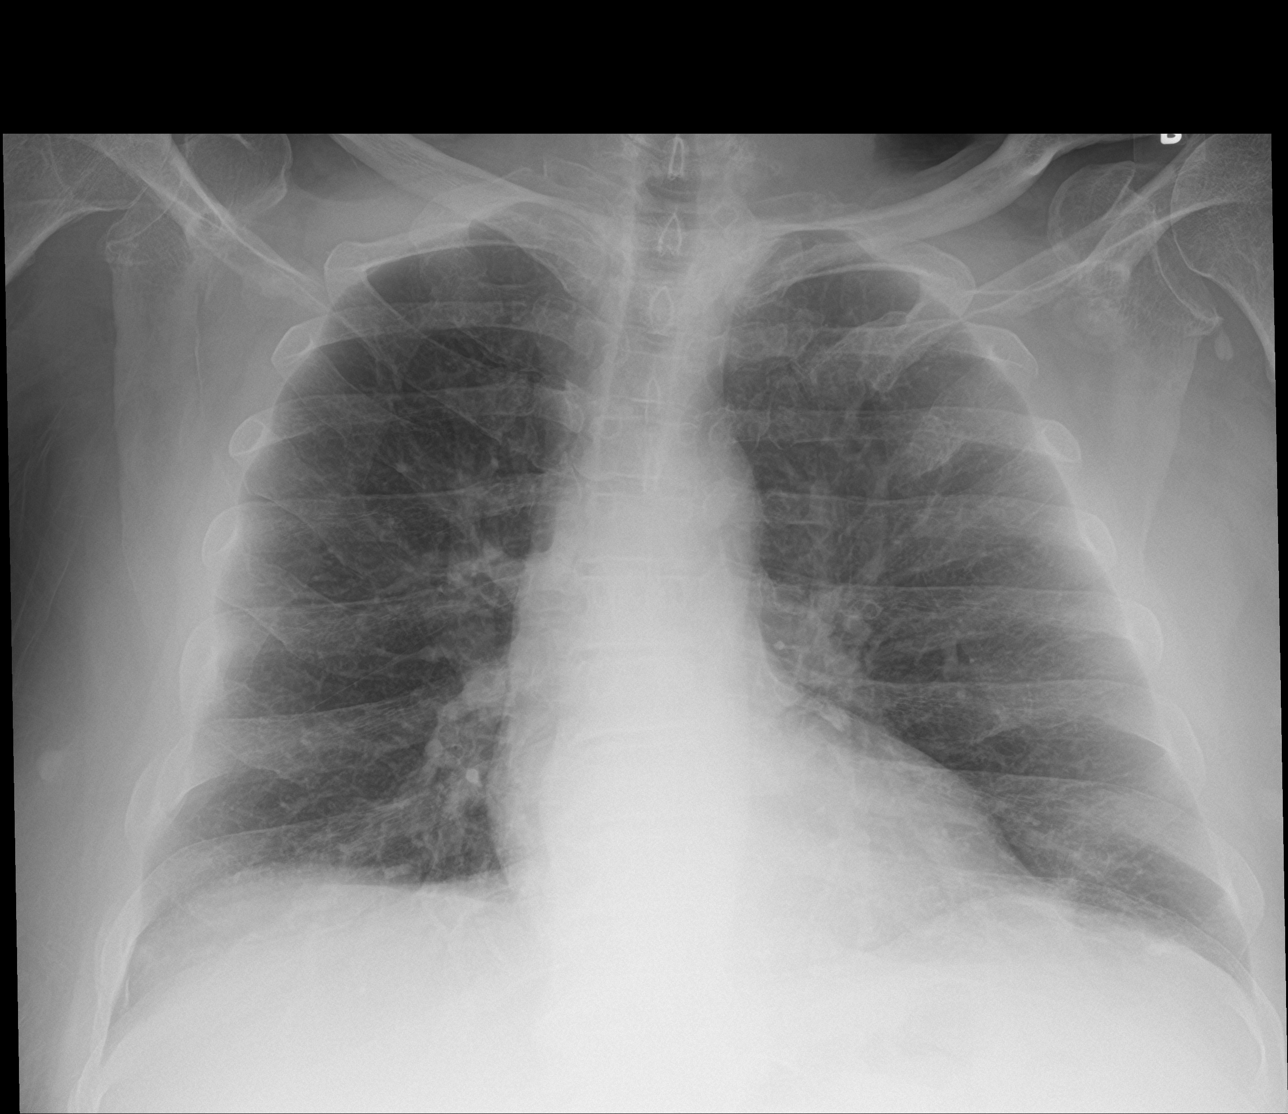

[chest lat (2 of 2)]
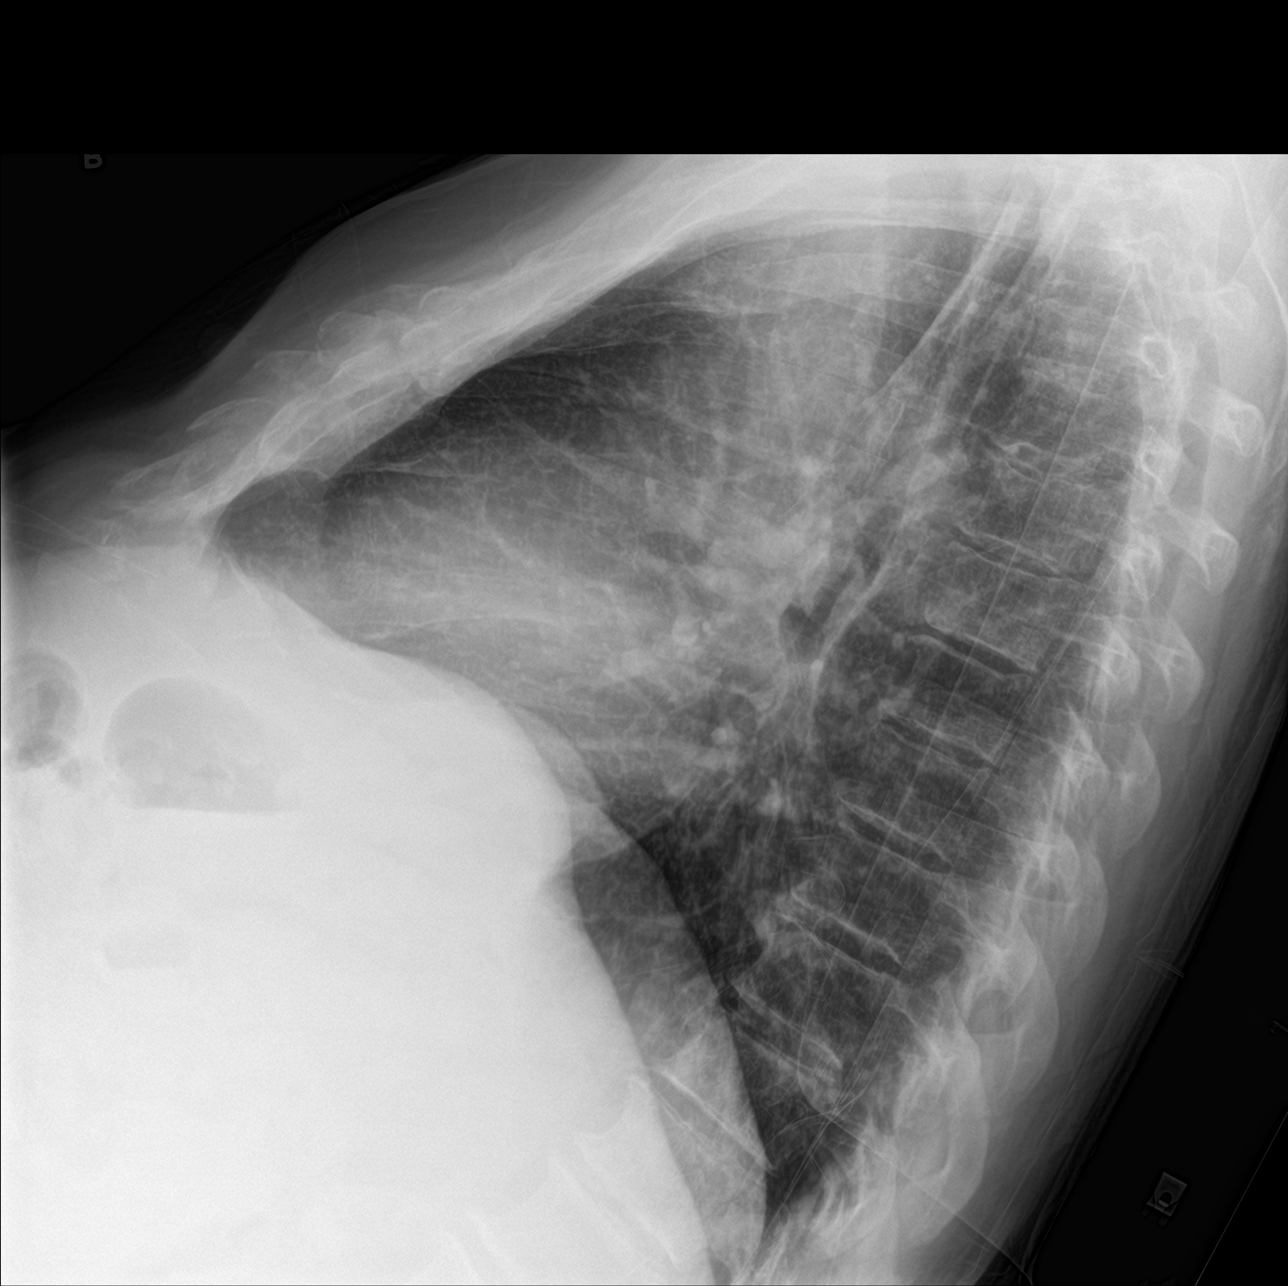

[3 of 3 positions shown; findings below may reference images not displayed]

FINDINGS: The heart size and mediastinal contours are within normal limits.
Both lungs are clear. No pneumothorax or pleural effusion is noted.
The visualized skeletal structures are unremarkable.
IMPRESSION: No active cardiopulmonary disease.

## 2018-06-22 ENCOUNTER — Encounter: Payer: Self-pay | Admitting: Family Medicine

## 2018-06-22 ENCOUNTER — Ambulatory Visit: Payer: BLUE CROSS/BLUE SHIELD | Admitting: Licensed Clinical Social Worker

## 2018-06-22 ENCOUNTER — Ambulatory Visit: Payer: BLUE CROSS/BLUE SHIELD | Attending: Family Medicine | Admitting: Family Medicine

## 2018-06-22 VITALS — BP 136/80 | HR 124 | Temp 98.7°F | Resp 18 | Ht 65.0 in | Wt 213.0 lb

## 2018-06-22 DIAGNOSIS — F419 Anxiety disorder, unspecified: Secondary | ICD-10-CM | POA: Diagnosis not present

## 2018-06-22 DIAGNOSIS — F1911 Other psychoactive substance abuse, in remission: Secondary | ICD-10-CM

## 2018-06-22 DIAGNOSIS — I10 Essential (primary) hypertension: Secondary | ICD-10-CM | POA: Insufficient documentation

## 2018-06-22 DIAGNOSIS — F332 Major depressive disorder, recurrent severe without psychotic features: Secondary | ICD-10-CM

## 2018-06-22 DIAGNOSIS — Z8249 Family history of ischemic heart disease and other diseases of the circulatory system: Secondary | ICD-10-CM | POA: Diagnosis not present

## 2018-06-22 DIAGNOSIS — Z809 Family history of malignant neoplasm, unspecified: Secondary | ICD-10-CM | POA: Insufficient documentation

## 2018-06-22 DIAGNOSIS — E1165 Type 2 diabetes mellitus with hyperglycemia: Secondary | ICD-10-CM | POA: Diagnosis present

## 2018-06-22 DIAGNOSIS — F329 Major depressive disorder, single episode, unspecified: Secondary | ICD-10-CM | POA: Diagnosis not present

## 2018-06-22 DIAGNOSIS — Z794 Long term (current) use of insulin: Secondary | ICD-10-CM | POA: Diagnosis not present

## 2018-06-22 DIAGNOSIS — Z833 Family history of diabetes mellitus: Secondary | ICD-10-CM | POA: Insufficient documentation

## 2018-06-22 DIAGNOSIS — M1990 Unspecified osteoarthritis, unspecified site: Secondary | ICD-10-CM | POA: Insufficient documentation

## 2018-06-22 DIAGNOSIS — E118 Type 2 diabetes mellitus with unspecified complications: Secondary | ICD-10-CM | POA: Diagnosis not present

## 2018-06-22 DIAGNOSIS — R350 Frequency of micturition: Secondary | ICD-10-CM

## 2018-06-22 DIAGNOSIS — Z9889 Other specified postprocedural states: Secondary | ICD-10-CM | POA: Diagnosis not present

## 2018-06-22 DIAGNOSIS — Z79899 Other long term (current) drug therapy: Secondary | ICD-10-CM | POA: Diagnosis not present

## 2018-06-22 DIAGNOSIS — Z125 Encounter for screening for malignant neoplasm of prostate: Secondary | ICD-10-CM | POA: Diagnosis not present

## 2018-06-22 DIAGNOSIS — Z87898 Personal history of other specified conditions: Secondary | ICD-10-CM

## 2018-06-22 LAB — GLUCOSE, POCT (MANUAL RESULT ENTRY): POC Glucose: 370 mg/dL — AB (ref 70–99)

## 2018-06-22 LAB — POCT GLYCOSYLATED HEMOGLOBIN (HGB A1C): HbA1c, POC (controlled diabetic range): 11.2 % — AB (ref 0.0–7.0)

## 2018-06-22 MED ORDER — GLIPIZIDE 5 MG PO TABS: 5.0000 mg | ORAL_TABLET | Freq: Two times a day (BID) | ORAL | 6 refills | Status: DC

## 2018-06-22 MED ORDER — LISINOPRIL 10 MG PO TABS: 10.0000 mg | ORAL_TABLET | Freq: Every day | ORAL | 6 refills | Status: DC

## 2018-06-22 MED ORDER — INSULIN SYRINGES (DISPOSABLE) U-100 0.5 ML MISC: 6 refills | Status: DC

## 2018-06-22 MED ORDER — INSULIN ASPART PROT & ASPART (70-30 MIX) 100 UNIT/ML ~~LOC~~ SUSP: 20.0000 [IU] | Freq: Two times a day (BID) | SUBCUTANEOUS | 6 refills | Status: DC

## 2018-06-22 MED ORDER — MICROLET NEXT LANCING DEVICE MISC: 1.0000 | Freq: Three times a day (TID) | 12 refills | Status: DC

## 2018-06-22 MED ORDER — FUROSEMIDE 20 MG PO TABS: 20.0000 mg | ORAL_TABLET | Freq: Every day | ORAL | 6 refills | Status: DC

## 2018-06-22 MED ORDER — CONTOUR NEXT MONITOR W/DEVICE KIT: 1.0000 | PACK | Freq: Three times a day (TID) | 0 refills | Status: DC

## 2018-06-22 MED ORDER — GLUCOSE BLOOD VI STRP: ORAL_STRIP | 12 refills | Status: DC

## 2018-06-22 NOTE — BH Specialist Note (Signed)
Integrated Behavioral Health Initial Visit  MRN: [id number] Name: Jeffrey JasperJoe A Buerkle  Number of Integrated Behavioral Health Clinician visits:: 1/6 Session Start time: 4:35 PM  Session End time: 5:00 PM Total time: 25 minutes  Type of Service: Integrated Behavioral Health- Individual/Family Interpretor:No. Interpretor Name and Language: N/A   Warm Hand Off Completed.       SUBJECTIVE: Jeffrey Murphy is a 62 y.o. male accompanied by self Patient was referred by Dr. Jillyn HiddenFulp for substance use resources. Patient reports the following symptoms/concerns: feelings of sadness and worry, difficulty sleeping, low energy, feeling bad about self, decreased concentration, restlessness, irritability, and hx of suicidal ideations Duration of problem: Over a year; Severity of problem: moderate  OBJECTIVE: Mood: Anxious and Affect: Appropriate Risk of harm to self or others: No plan to harm self or others  LIFE CONTEXT: Family and Social: Pt currently resides with a friend. He does not get along with wife. 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 He has a brother who pays for pt's medical coverage and car insurance. Pt receives food stamps Self-Care: Pt has a hx of substance use (crack cocaine) He is currently five days sober Life Changes: Pt was employed for 39 years prior to being laid off. Pt shared that he begin to use substances which resulted in strain with wife and a decline in health. Pt is trying to "put the puzzle pieces back together"  GOALS ADDRESSED: Patient will: 1. Reduce symptoms of: anxiety and depression 2. Increase knowledge and/or ability of: coping skills  3. Demonstrate ability to: Increase healthy adjustment to current life circumstances, Increase adequate support systems for patient/family and Decrease self-medicating behaviors  INTERVENTIONS: Interventions utilized: Supportive  Counseling, Psychoeducation and/or Health Education and Link to [facility]  Standardized Assessments completed: GAD-7 and PHQ 2&9 with C-SSRS  ASSESSMENT: Patient currently experiencing depression and anxiety triggered by hx of substance use. He reports feelings of sadness and worry, difficulty sleeping, low energy, feeling bad about self, decreased concentration, restlessness, irritability, and hx of suicidal ideations. Pt currently denies SI/HI/AVH and receives limited support. Protective factors and safety plan were discussed and crisis intervention resources were provided.    Patient may benefit from psychoeducation, psychotherapy, and medication management. LCSWA educated pt on the correlation between one's physical and mental health, in addition, to how substance use negatively impacts health. Healthy coping skills were discussed and pt was strongly encouraged to participate in substance use treatment. Pt is currently not interested in treatment and/or resources. LCSWA provided pt with behavioral health and employment resources.   PLAN: 1. Follow up with behavioral health clinician on : Pt was encouraged to contact LCSWA if symptoms worsen or fail to improve to [facility]. 2. Behavioral recommendations: LCSWA recommends that pt apply healthy coping skills discussed and utilize provided resources. Pt is encouraged to schedule follow up appointment with LCSWA 3. Referral(s): Integrated Art gallery managerBehavioral Health Services (In Clinic) and [facility] Resources:  Finances 4. "From scale of 1-10, how likely are you to follow plan?":   Bridgett LarssonJasmine D Lewis, LCSW 06/25/18 10:14 AM

## 2018-06-22 NOTE — Progress Notes (Signed)
Subjective:    Patient ID: Jeffrey Murphy, male    DOB: 09/15/1956, 62 y.o.   MRN: [id number]  HPI 62 yo male who is new to the practice.  Patient reports that he has diabetes but he is not really monitoring his blood sugars and has been rationing his insulin to save money.  Patient reports that he has a history of substance abuse and is trying to abstain from any illicit drug use.  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  Patient has noticed increased urinary frequency including having to get up at night to urinate.  Patient denies any dysuria.  Patient does have some increased thirst.  Patient does have some joint pain secondary to arthritis as well as complaint of fatigue.  Patient does have some anxiety and depression over his health as well as his finances/social situation.  Patient states that his wife left him over his drug use but then she also became addicted to drugs.  Patient is currently living with a friend and his friend does not tolerate drug use and therefore patient has not had any drugs in several weeks.  Patient states that he is attending church regularly and trying to get his life back together.  Patient wants to get his diabetes back under control.  Patient reports some occasional burning/needlelike sensations in his feet but this does not occur regularly.  Discomfort in his feet tends to occur when his blood sugars are high or if he has been standing for long time.  Past Medical History:  Diagnosis Date  . Abscess 12/2016   LEFT INGUINAL ABSCESS  . Arthritis   . Diabetes mellitus without complication ([place])   . Hypertension   . Wears glasses    reading   Social History   Tobacco Use  . Smoking status: Never Smoker  . Smokeless tobacco: Never Used  Substance Use Topics  . Alcohol use: Yes    Comment: 12/2016  STATES HE QUIT ALCOHOL  . Drug  use: Yes    Frequency: 5.0 times per week    Types: Marijuana, Cocaine, "Crack" cocaine    Comment: last used yesterday    Family History  Problem Relation Age of Onset  . Cancer Mother   . Hypertension Mother   . Diabetes Father    Past Surgical History:  Procedure Laterality Date  . COLONOSCOPY    . CYST REMOVAL NECK    . INGUINAL HERNIA REPAIR     right  . IRRIGATION AND DEBRIDEMENT ABSCESS Left 12/12/2016   Procedure: IRRIGATION AND DEBRIDEMENT ABSCESS;  Surgeon: Stark Klein, MD;  Location: [facility];  Service: General;  Laterality: Left;  . JOINT REPLACEMENT     right total knee  . PILONIDAL CYST EXCISION     back  . QUADRICEPS TENDON REPAIR Right 12/24/2014   Procedure: RIGHT QUADRICEP TENDON REPAIR ;  Surgeon: Marianna Payment, MD;  Location: [facility];  Service: Orthopedics;  Laterality: Right;  . REPLACEMENT TOTAL KNEE    No Known Allergies   Review of Systems  Constitutional: Positive for fatigue. Negative for chills and fever.  HENT: Negative for sore throat and trouble swallowing.   Eyes: Positive for visual disturbance (when blood sugar is high).  Respiratory: Negative for cough and shortness of breath.   Cardiovascular: Positive for leg swelling (mmild swelling in  legs). Negative for chest pain and palpitations.  Gastrointestinal: Negative for abdominal pain, constipation and nausea.  Genitourinary: Positive for frequency. Negative for dysuria and flank pain.  Musculoskeletal: Positive for arthralgias. Negative for back pain.  Neurological: Negative for dizziness and headaches.  Psychiatric/Behavioral: Negative for suicidal ideas. The patient is not nervous/anxious.        Objective:   Physical Exam  Constitutional: He is oriented to person, place, and time.  Overweight for height older male in no acute distress.  Patient is very talkative and animated at today's visit.  HENT:  Head: Normocephalic and atraumatic.  Right Ear: Tympanic membrane  normal.  Left Ear: Tympanic membrane normal.  Mouth/Throat: Oropharynx is clear and moist.  Eyes: Conjunctivae and EOM are normal.  Cardiovascular: Normal rate and regular rhythm.  Pulses:      Dorsalis pedis pulses are 2+ on the right side, and 2+ on the left side.       Posterior tibial pulses are 2+ on the right side, and 2+ on the left side.  Pulmonary/Chest: Effort normal and breath sounds normal.  Genitourinary:  Genitourinary Comments: No CVA tenderness on exam  Musculoskeletal: He exhibits edema (Mild distal nonpitting lower extremity edema) and tenderness (Lumbosacral tenderness to palpation and joint line tenderness of the knees).  Feet:  Right Foot:  Protective Sensation: 10 sites tested. 10 sites sensed.  Skin Integrity: Positive for callus and dry skin.  Left Foot:  Protective Sensation: 10 sites tested. 10 sites sensed.  Skin Integrity: Positive for callus and dry skin.  Neurological: He is alert and oriented to person, place, and time.  Psychiatric: He has a normal mood and affect.  Nursing note and vitals reviewed.   BP 136/80 (BP Location: Left Arm, Patient Position: Sitting, Cuff Size: Normal)   Pulse (!) 124   Temp 98.7 F (37.1 C) (Oral)   Resp 18   Ht '5\' 5"'$  (1.651 m)   Wt 213 lb (96.6 kg)   SpO2 96%   BMI 35.45 kg/m       Assessment & Plan:  1. Diabetes mellitus with complication [facility]) Patient reports poor control of blood sugar secondary to rationing his insulin in order to save money.  Patient had random blood sugar and hemoglobin A1c at today's visit both of which were consistent with uncontrolled diabetes as his A1c was 11.9 and blood sugar of 370 here in the office.  Patient states that he will take his insulin dose once he leaves the clinic.  Patient has been able to find a part-time job and has to go home after this visit before he goes to work.  Patient was provided with refills of his medications and patient is to monitor his blood sugars at least  twice per day and bring back glucometer and blood sugar diary to his next visit in approximately 2 weeks. - Glucose (CBG) - HgB A1c - Microalbumin/Creatinine Ratio, Urine  2. Uncontrolled type 2 diabetes mellitus with hyperglycemia ([place]) Patient with blood sugar of 370 here in the office and hemoglobin A1c of 11.9 consistent with poorly controlled type 2 diabetes with hyperglycemia.  Patient did have normal monofilament exam.  Patient will have urine microalbumin to look for any damage to the kidneys as a result of his uncontrolled diabetes.  Patient is to return in 2 weeks for follow-up of diabetes.  Patient was given a refill of his insulin which he has been rationing in order to help lower his healthcare cost but this has  led to his blood sugars being very uncontrolled.  Patient was also asked to start monitoring his blood sugars again and to bring his blood sugar diary and glucometer to his next visit.  Patient will need diabetic eye exam and patient will be offered influenza immunization at his follow-up visit in 2 weeks patient should call sooner if his blood sugars remain greater than 160 fasting or greater than 200 nonfasting after he resumes regular use of his insulin. - CBC with Differential - Comprehensive metabolic panel - lisinopril (PRINIVIL,ZESTRIL) 10 MG tablet; Take 1 tablet (10 mg total) by mouth daily.  Dispense: 30 tablet; Refill: 6 - Insulin Syringes, Disposable, U-100 0.5 ML MISC; Use as directed for insulin  Dispense: 100 each; Refill: 6 - insulin aspart protamine- aspart (NOVOLOG MIX 70/30) (70-30) 100 UNIT/ML injection; Inject 0.2 mLs (20 Units total) into the skin 2 (two) times daily with a meal. Use any generic insulin 70/30 available  Dispense: 10 mL; Refill: 6 - glipiZIDE (GLUCOTROL) 5 MG tablet; Take 1 tablet (5 mg total) by mouth 2 (two) times daily before a meal.  Dispense: 60 tablet; Refill: 6 - furosemide (LASIX) 20 MG tablet; Take 1 tablet (20 mg total) by mouth daily.   Dispense: 30 tablet; Refill: 6  3. Insulin long-term use ([place]) Patient with long-term use of insulin for control of diabetes but patient has had financial difficulties and therefore has tried to decrease his insulin dose to save money.  The importance of control of diabetes to help prevent long-term medical complications were discussed with the patient and patient also met with social worker after the appointment to see what resources may be available to help ensure that he receives his medications.  4. Urinary frequency Patient will have PSA done in follow-up of urinary frequency and nocturia.  Urinary frequency is likely related to patient's diabetes.  Urinalysis was also ordered but not performed at today's visit therefore this order was removed.  Patient will have urinalysis at his next visit if continued complaints of urinary frequency but hopefully if patient's blood sugar is better controlled, his urinary frequency will normalize. - PSA  5. Screening for prostate cancer Patient does have some urinary frequency and nocturia and will have PSA done as a screening test for prostate cancer after having a discussion regarding the role of PSA and screening. - PSA  6. History of substance abuse Patient reports a history of substance abuse but states that he is trying to abstain from the use of any illicit substances.  Patient is also applying for work and patient agrees to drug screen as he would like to make sure that he no longer has any drugs in his system.  Patient also with an abnormal depression screen and social work met with the patient for counseling regarding resources at the end of his visit. - Drug Screen 12+Alcohol+CRT, Ur  An After Visit Summary was printed and given to the patient.  Return in about 2 weeks (around 07/06/2018) for follow-up diabetes.

## 2018-06-22 NOTE — Patient Instructions (Signed)
Diabetes Mellitus and Nutrition When you have diabetes (diabetes mellitus), it is very important to have healthy eating habits because your blood sugar (glucose) levels are greatly affected by what you eat and drink. Eating healthy foods in the appropriate amounts, at about the same times every day, can help you:  Control your blood glucose.  Lower your risk of heart disease.  Improve your blood pressure.  Reach or maintain a healthy weight.  Every person with diabetes is different, and each person has different needs for a meal plan. Your health care provider may recommend that you work with a diet and nutrition specialist (dietitian) to make a meal plan that is best for you. Your meal plan may vary depending on factors such as:  The calories you need.  The medicines you take.  Your weight.  Your blood glucose, blood pressure, and cholesterol levels.  Your activity level.  Other health conditions you have, such as heart or kidney disease.  How do carbohydrates affect me? Carbohydrates affect your blood glucose level more than any other type of food. Eating carbohydrates naturally increases the amount of glucose in your blood. Carbohydrate counting is a method for keeping track of how many carbohydrates you eat. Counting carbohydrates is important to keep your blood glucose at a healthy level, especially if you use insulin or take certain oral diabetes medicines. It is important to know how many carbohydrates you can safely have in each meal. This is different for every person. Your dietitian can help you calculate how many carbohydrates you should have at each meal and for snack. Foods that contain carbohydrates include:  Bread, cereal, rice, pasta, and crackers.  Potatoes and corn.  Peas, beans, and lentils.  Milk and yogurt.  Fruit and juice.  Desserts, such as cakes, cookies, ice cream, and candy.  How does alcohol affect me? Alcohol can cause a sudden decrease in blood  glucose (hypoglycemia), especially if you use insulin or take certain oral diabetes medicines. Hypoglycemia can be a life-threatening condition. Symptoms of hypoglycemia (sleepiness, dizziness, and confusion) are similar to symptoms of having too much alcohol. If your health care provider says that alcohol is safe for you, follow these guidelines:  Limit alcohol intake to no more than 1 drink per day for nonpregnant women and 2 drinks per day for men. One drink equals 12 oz of beer, 5 oz of wine, or 1 oz of hard liquor.  Do not drink on an empty stomach.  Keep yourself hydrated with water, diet soda, or unsweetened iced tea.  Keep in mind that regular soda, juice, and other mixers may contain a lot of sugar and must be counted as carbohydrates.  What are tips for following this plan? Reading food labels  Start by checking the serving size on the label. The amount of calories, carbohydrates, fats, and other nutrients listed on the label are based on one serving of the food. Many foods contain more than one serving per package.  Check the total grams (g) of carbohydrates in one serving. You can calculate the number of servings of carbohydrates in one serving by dividing the total carbohydrates by 15. For example, if a food has 30 g of total carbohydrates, it would be equal to 2 servings of carbohydrates.  Check the number of grams (g) of saturated and trans fats in one serving. Choose foods that have low or no amount of these fats.  Check the number of milligrams (mg) of sodium in one serving. Most people   should limit total sodium intake to less than 2,300 mg per day.  Always check the nutrition information of foods labeled as "low-fat" or "nonfat". These foods may be higher in added sugar or refined carbohydrates and should be avoided.  Talk to your dietitian to identify your daily goals for nutrients listed on the label. Shopping  Avoid buying canned, premade, or processed foods. These  foods tend to be high in fat, sodium, and added sugar.  Shop around the outside edge of the grocery store. This includes fresh fruits and vegetables, bulk grains, fresh meats, and fresh dairy. Cooking  Use low-heat cooking methods, such as baking, instead of high-heat cooking methods like deep frying.  Cook using healthy oils, such as olive, canola, or sunflower oil.  Avoid cooking with butter, cream, or high-fat meats. Meal planning  Eat meals and snacks regularly, preferably at the same times every day. Avoid going long periods of time without eating.  Eat foods high in fiber, such as fresh fruits, vegetables, beans, and whole grains. Talk to your dietitian about how many servings of carbohydrates you can eat at each meal.  Eat 4-6 ounces of lean protein each day, such as lean meat, chicken, fish, eggs, or tofu. 1 ounce is equal to 1 ounce of meat, chicken, or fish, 1 egg, or 1/4 cup of tofu.  Eat some foods each day that contain healthy fats, such as avocado, nuts, seeds, and fish. Lifestyle   Check your blood glucose regularly.  Exercise at least 30 minutes 5 or more days each week, or as told by your health care provider.  Take medicines as told by your health care provider.  Do not use any products that contain nicotine or tobacco, such as cigarettes and e-cigarettes. If you need help quitting, ask your health care provider.  Work with a counselor or diabetes educator to identify strategies to manage stress and any emotional and social challenges. What are some questions to ask my health care provider?  Do I need to meet with a diabetes educator?  Do I need to meet with a dietitian?  What number can I call if I have questions?  When are the best times to check my blood glucose? Where to find more information:  American Diabetes Association: diabetes.org/food-and-fitness/food  Academy of Nutrition and Dietetics:  www.eatright.org/resources/health/diseases-and-conditions/diabetes  National Institute of Diabetes and Digestive and Kidney Diseases (NIH): www.niddk.nih.gov/health-information/diabetes/overview/diet-eating-physical-activity Summary  A healthy meal plan will help you control your blood glucose and maintain a healthy lifestyle.  Working with a diet and nutrition specialist (dietitian) can help you make a meal plan that is best for you.  Keep in mind that carbohydrates and alcohol have immediate effects on your blood glucose levels. It is important to count carbohydrates and to use alcohol carefully. This information is not intended to replace advice given to you by your health care provider. Make sure you discuss any questions you have with your health care provider. Document Released: 07/21/2005 Document Revised: 11/28/2016 Document Reviewed: 11/28/2016 Elsevier Interactive Patient Education  2018 Elsevier Inc.  

## 2018-06-23 LAB — DRUG SCREEN 12+ALCOHOL+CRT, UR
Amphetamines, Urine: NEGATIVE ng/mL
BENZODIAZ UR QL: NEGATIVE ng/mL
Barbiturate: NEGATIVE ng/mL
Cannabinoids: NEGATIVE ng/mL
Cocaine (Metabolite): NEGATIVE ng/mL
Creatinine, Urine: 76.6 mg/dL (ref 20.0–300.0)
Ethanol, Urine: NEGATIVE %
Meperidine: NEGATIVE ng/mL
Methadone: NEGATIVE ng/mL
OPIATE SCREEN URINE: NEGATIVE ng/mL
Oxycodone/Oxymorphone, Urine: NEGATIVE ng/mL
Phencyclidine: NEGATIVE ng/mL
Propoxyphene: NEGATIVE ng/mL
Tramadol: NEGATIVE ng/mL

## 2018-06-23 LAB — CBC WITH DIFFERENTIAL/PLATELET
Basophils Absolute: 0 x10E3/uL (ref 0.0–0.2)
Basos: 0 %
EOS (ABSOLUTE): 0.2 x10E3/uL (ref 0.0–0.4)
Eos: 3 %
Hematocrit: 48.2 % (ref 37.5–51.0)
Hemoglobin: 16.8 g/dL (ref 13.0–17.7)
Immature Grans (Abs): 0 x10E3/uL (ref 0.0–0.1)
Immature Granulocytes: 0 %
Lymphocytes Absolute: 2 x10E3/uL (ref 0.7–3.1)
Lymphs: 27 %
MCH: 30.1 pg (ref 26.6–33.0)
MCHC: 34.9 g/dL (ref 31.5–35.7)
MCV: 86 fL (ref 79–97)
Monocytes Absolute: 0.4 x10E3/uL (ref 0.1–0.9)
Monocytes: 6 %
Neutrophils Absolute: 4.9 x10E3/uL (ref 1.4–7.0)
Neutrophils: 64 %
Platelets: 199 x10E3/uL (ref 150–450)
RBC: 5.58 x10E6/uL (ref 4.14–5.80)
RDW: 13.8 % (ref 12.3–15.4)
WBC: 7.6 x10E3/uL (ref 3.4–10.8)

## 2018-06-23 LAB — COMPREHENSIVE METABOLIC PANEL WITH GFR
ALT: 28 IU/L (ref 0–44)
AST: 21 IU/L (ref 0–40)
Albumin/Globulin Ratio: 1.4 (ref 1.2–2.2)
Albumin: 4.7 g/dL (ref 3.5–5.5)
Alkaline Phosphatase: 112 IU/L (ref 39–117)
BUN/Creatinine Ratio: 20 (ref 9–20)
BUN: 14 mg/dL (ref 6–24)
Bilirubin Total: 0.5 mg/dL (ref 0.0–1.2)
CO2: 21 mmol/L (ref 20–29)
Calcium: 9.7 mg/dL (ref 8.7–10.2)
Chloride: 98 mmol/L (ref 96–106)
Creatinine, Ser: 0.71 mg/dL — ABNORMAL LOW (ref 0.76–1.27)
GFR calc Af Amer: 119 mL/min/1.73
GFR calc non Af Amer: 103 mL/min/1.73
Globulin, Total: 3.3 g/dL (ref 1.5–4.5)
Glucose: 326 mg/dL — ABNORMAL HIGH (ref 65–99)
Potassium: 4.4 mmol/L (ref 3.5–5.2)
Sodium: 138 mmol/L (ref 134–144)
Total Protein: 8 g/dL (ref 6.0–8.5)

## 2018-06-23 LAB — MICROALBUMIN / CREATININE URINE RATIO
Creatinine, Urine: 72.2 mg/dL
Microalb/Creat Ratio: 249.2 mg/g{creat} — ABNORMAL HIGH (ref 0.0–30.0)
Microalbumin, Urine: 179.9 ug/mL

## 2018-06-23 LAB — PSA: Prostate Specific Ag, Serum: 0.4 ng/mL (ref 0.0–4.0)

## 2018-06-25 MED FILL — glipiZIDE 5 MG TABS: 5 | 30 days supply | Qty: 60 | Fill #0

## 2018-06-25 MED FILL — FUROSEMIDE 20 MG TABLET: 20 | 30 days supply | Qty: 30 | Fill #0

## 2018-06-27 ENCOUNTER — Telehealth: Payer: Self-pay

## 2018-06-27 NOTE — Telephone Encounter (Signed)
Can you contact the pharmacy and see what he has filled in the past? He was a new work-in at the end of the day and he had been taking his insulin differently then prescribed in order to save money. It sounded like he was on 70/30 but was only using once per day and I told him how this insulin is a combination of short and long acting and that it would be best to take it twice a day before the morning an d evening meals. You can refill it per insurance preference for brand but confirm that it is a 70/30 mix and call me if questions about how it was taken in the past. My cell phone is [phone] but I am also in my office and the ext is [phone]

## 2018-06-27 NOTE — Telephone Encounter (Signed)
Pharmacy needs clarification on Novolog 70/30, they want to know if you meant Novolin 70/30 and if so please send them a new RX for that product. If you did want Novolog I can call the pharmacy to confirm that with them.

## 2018-06-28 ENCOUNTER — Telehealth: Payer: Self-pay | Admitting: Family Medicine

## 2018-06-28 MED ORDER — INSULIN NPH ISOPHANE & REGULAR (70-30) 100 UNIT/ML ~~LOC~~ SUSP: 20.0000 [IU] | Freq: Two times a day (BID) | SUBCUTANEOUS | 6 refills | Status: DC

## 2018-06-28 MED FILL — NOVOLIN 70/30 100 UNITS/ML: (70-30) 100 | 25 days supply | Qty: 10 | Fill #0

## 2018-06-28 NOTE — Telephone Encounter (Signed)
Called [facility] to confirm which insulin was most accessible to the patient in regards to pricing, they offer Novolin 70/30 for $25 per vial for cash payers, his ins on ile w/ them was showing inactive. They were going to fill RX for $25.  I called pt to make sure that was ok, he said it would be fine but he wouldn't be able to pick it up for a few days. I told the pt that our pharmacy could give it to him for free as a "one time free fill" he was very excited and appreciative and stated that we were going to become his main pharmacy anyway. We will fill RX today and pt will come by for pick up this afternoon. During this conversation the patient mentioned having better numbers already in regards to his blood sugar but that it was still a little high and he didn't know why, he reported seeing numbers between 150-200. I informed him of the diabetic counseling that can be provided by [facility] and he was very interested and we scheduled an appt for 8/30 right after his follow-up with Dr. Jillyn HiddenFulp that morning. Pt also [facility] and I will speak with her to get that to him.

## 2018-06-28 NOTE — Telephone Encounter (Signed)
ERROR

## 2018-06-28 NOTE — Telephone Encounter (Signed)
Medication was sent to the pharmacy  °

## 2018-07-06 ENCOUNTER — Other Ambulatory Visit: Payer: Self-pay

## 2018-07-06 ENCOUNTER — Ambulatory Visit: Payer: BLUE CROSS/BLUE SHIELD | Admitting: Pharmacist

## 2018-07-06 ENCOUNTER — Encounter: Payer: Self-pay | Admitting: Family Medicine

## 2018-07-06 ENCOUNTER — Ambulatory Visit: Payer: BLUE CROSS/BLUE SHIELD | Attending: Family Medicine | Admitting: Family Medicine

## 2018-07-06 VITALS — BP 137/91 | HR 75 | Temp 98.6°F | Resp 18 | Ht 69.0 in | Wt 223.4 lb

## 2018-07-06 DIAGNOSIS — I10 Essential (primary) hypertension: Secondary | ICD-10-CM | POA: Insufficient documentation

## 2018-07-06 DIAGNOSIS — Z23 Encounter for immunization: Secondary | ICD-10-CM | POA: Insufficient documentation

## 2018-07-06 DIAGNOSIS — E1165 Type 2 diabetes mellitus with hyperglycemia: Secondary | ICD-10-CM | POA: Diagnosis not present

## 2018-07-06 DIAGNOSIS — F1920 Other psychoactive substance dependence, uncomplicated: Secondary | ICD-10-CM | POA: Diagnosis not present

## 2018-07-06 DIAGNOSIS — Z9114 Patient's other noncompliance with medication regimen: Secondary | ICD-10-CM | POA: Diagnosis not present

## 2018-07-06 DIAGNOSIS — Z794 Long term (current) use of insulin: Secondary | ICD-10-CM | POA: Insufficient documentation

## 2018-07-06 DIAGNOSIS — Z79899 Other long term (current) drug therapy: Secondary | ICD-10-CM | POA: Insufficient documentation

## 2018-07-06 DIAGNOSIS — F329 Major depressive disorder, single episode, unspecified: Secondary | ICD-10-CM | POA: Diagnosis not present

## 2018-07-06 DIAGNOSIS — F419 Anxiety disorder, unspecified: Secondary | ICD-10-CM | POA: Diagnosis not present

## 2018-07-06 DIAGNOSIS — R399 Unspecified symptoms and signs involving the genitourinary system: Secondary | ICD-10-CM

## 2018-07-06 LAB — POCT CBG (FASTING - GLUCOSE)-MANUAL ENTRY: Glucose Fasting, POC: 174 mg/dL — AB (ref 70–99)

## 2018-07-06 MED ORDER — GLIPIZIDE 10 MG PO TABS: 10.0000 mg | ORAL_TABLET | Freq: Two times a day (BID) | ORAL | 3 refills | Status: DC

## 2018-07-06 MED ORDER — TAMSULOSIN HCL 0.4 MG PO CAPS: 0.4000 mg | ORAL_CAPSULE | Freq: Every day | ORAL | 3 refills | Status: DC

## 2018-07-06 MED FILL — TAMSULOSIN HCL 0.4 MG CAP: 0.4 | 30 days supply | Qty: 30 | Fill #0

## 2018-07-06 MED FILL — glipiZIDE 10 MG TABS: 10 | 30 days supply | Qty: 60 | Fill #0

## 2018-07-06 NOTE — Progress Notes (Signed)
Subjective:  Patient ID: Jeffrey Murphy, male    DOB: 05-25-1956  Age: 62 y.o. MRN: [id number]  CC: Follow-up   HPI Jeffrey Murphy is a 62 year old male patient of Dr. Chapman Fitch with a history of type 2 diabetes mellitus (A1c 11.9), hypertension, anxiety who is here for follow-up visit. He had been noncompliant with his medications but since his last visit has been more compliant and was supposed to bring in his glucometer for today's visit but forgot.  He reports his home fasting sugars have ranged from 178-250. He had complained of urinary symptoms at his last visit and PSA was normal.  He informs me that this has improved however he continues to have nocturia and urinary urgency but no dysuria. He is working on abstaining completely from substance abuse and was seen by the LCSW 2 weeks ago for psychotherapy with regards to substance abuse related anxiety and depression.  Past Medical History:  Diagnosis Date  . Abscess 12/2016   LEFT INGUINAL ABSCESS  . Arthritis   . Diabetes mellitus without complication ([place])   . Hypertension   . Wears glasses    reading    Past Surgical History:  Procedure Laterality Date  . COLONOSCOPY    . CYST REMOVAL NECK    . INGUINAL HERNIA REPAIR     right  . IRRIGATION AND DEBRIDEMENT ABSCESS Left 12/12/2016   Procedure: IRRIGATION AND DEBRIDEMENT ABSCESS;  Surgeon: Stark Klein, MD;  Location: [place];  Service: General;  Laterality: Left;  . JOINT REPLACEMENT     right total knee  . PILONIDAL CYST EXCISION     back  . QUADRICEPS TENDON REPAIR Right 12/24/2014   Procedure: RIGHT QUADRICEP TENDON REPAIR ;  Surgeon: Marianna Payment, MD;  Location: [place];  Service: Orthopedics;  Laterality: Right;  . REPLACEMENT TOTAL KNEE      No Known Allergies   Outpatient Medications Prior to Visit  Medication Sig Dispense Refill  . blood glucose meter kit and supplies KIT Dispense based on patient and insurance preference. Use up to four  times daily as directed. (FOR ICD-9 250.00, 250.01). 1 each 0  . Blood Glucose Monitoring Suppl (CONTOUR NEXT MONITOR) w/Device KIT 1 each by Does not apply route 3 (three) times daily. 1 kit 0  . furosemide (LASIX) 20 MG tablet Take 1 tablet (20 mg total) by mouth daily. 30 tablet 6  . glucose blood (CONTOUR NEXT TEST) test strip Use as instructed 100 each 12  . insulin NPH-regular Human (NOVOLIN 70/30) (70-30) 100 UNIT/ML injection Inject 20 Units into the skin 2 (two) times daily with a meal. 10 mL 6  . Insulin Syringes, Disposable, U-100 0.5 ML MISC Use as directed for insulin 100 each 6  . Lancet Devices (MICROLET NEXT LANCING DEVICE) MISC 1 each by Does not apply route 3 (three) times daily. 1 each 12  . lisinopril (PRINIVIL,ZESTRIL) 10 MG tablet Take 1 tablet (10 mg total) by mouth daily. 30 tablet 6  . glipiZIDE (GLUCOTROL) 5 MG tablet Take 1 tablet (5 mg total) by mouth 2 (two) times daily before a meal. 60 tablet 6   No facility-administered medications prior to visit.     ROS Review of Systems  Constitutional: Negative for activity change and appetite change.  HENT: Negative for sinus pressure and sore throat.   Eyes: Negative for visual disturbance.  Respiratory: Negative for cough, chest tightness and shortness of breath.   Cardiovascular: Negative for chest pain  and leg swelling.  Gastrointestinal: Negative for abdominal distention, abdominal pain, constipation and diarrhea.  Endocrine: Negative.   Genitourinary: Negative for dysuria.  Musculoskeletal: Negative for joint swelling and myalgias.  Skin: Negative for rash.  Allergic/Immunologic: Negative.   Neurological: Negative for weakness, light-headedness and numbness.  Psychiatric/Behavioral: Negative for dysphoric mood and suicidal ideas.    Objective:  BP (!) 137/91 (BP Location: Left Arm, Patient Position: Sitting, Cuff Size: Normal)   Pulse 75   Temp 98.6 F (37 C) (Oral)   Resp 18   Ht '5\' 9"'$  (1.753 m)   Wt  223 lb 6.4 oz (101.3 kg)   SpO2 97%   BMI 32.99 kg/m   BP/Weight 07/06/2018 06/22/2018 9/50/7225  Systolic BP 750 518 335  Diastolic BP 91 80 71  Wt. (Lbs) 223.4 213 -  BMI 32.99 35.45 -      Physical Exam  Constitutional: He is oriented to person, place, and time. He appears well-developed and well-nourished.  Cardiovascular: Normal rate, normal heart sounds and intact distal pulses.  No murmur heard. Pulmonary/Chest: Effort normal and breath sounds normal. He has no wheezes. He has no rales. He exhibits no tenderness.  Abdominal: Soft. Bowel sounds are normal. He exhibits no distension and no mass. There is no tenderness.  Musculoskeletal: Normal range of motion.  Neurological: He is alert and oriented to person, place, and time.     Assessment & Plan:   1. Uncontrolled type 2 diabetes mellitus with hyperglycemia (HCC) Uncontrolled with A1c of 11.9 His fasting sugar is still elevated today Increased dose of glipizide Symptoms of hypoglycemia discussed and he knows to get in touch with the clinic if this occurs - glipiZIDE (GLUCOTROL) 10 MG tablet; Take 1 tablet (10 mg total) by mouth 2 (two) times daily before a meal.  Dispense: 60 tablet; Refill: 3  2. Lower urinary tract symptoms We will place on Flomax - tamsulosin (FLOMAX) 0.4 MG CAPS capsule; Take 1 capsule (0.4 mg total) by mouth daily.  Dispense: 30 capsule; Refill: 3  3. Essential hypertension Slight elevation No regimen change today Counseled on blood pressure goal of less than 130/80, low-sodium, DASH diet, medication compliance, 150 minutes of moderate intensity exercise per week. Discussed medication compliance, adverse effects.   He does have a form he would like completed and has been advised to follow-up with PCP regarding this.   Meds ordered this encounter  Medications  . tamsulosin (FLOMAX) 0.4 MG CAPS capsule    Sig: Take 1 capsule (0.4 mg total) by mouth daily.    Dispense:  30 capsule     Refill:  3  . glipiZIDE (GLUCOTROL) 10 MG tablet    Sig: Take 1 tablet (10 mg total) by mouth 2 (two) times daily before a meal.    Dispense:  60 tablet    Refill:  3    Discontinue previous dose    Follow-up: Return in about 3 months (around 10/06/2018) for follow up of Diabetes with D Fulp.   Charlott Rakes MD

## 2018-07-06 NOTE — Patient Instructions (Signed)
Diabetes Mellitus and Nutrition When you have diabetes (diabetes mellitus), it is very important to have healthy eating habits because your blood sugar (glucose) levels are greatly affected by what you eat and drink. Eating healthy foods in the appropriate amounts, at about the same times every day, can help you:  Control your blood glucose.  Lower your risk of heart disease.  Improve your blood pressure.  Reach or maintain a healthy weight.  Every person with diabetes is different, and each person has different needs for a meal plan. Your health care provider may recommend that you work with a diet and nutrition specialist (dietitian) to make a meal plan that is best for you. Your meal plan may vary depending on factors such as:  The calories you need.  The medicines you take.  Your weight.  Your blood glucose, blood pressure, and cholesterol levels.  Your activity level.  Other health conditions you have, such as heart or kidney disease.  How do carbohydrates affect me? Carbohydrates affect your blood glucose level more than any other type of food. Eating carbohydrates naturally increases the amount of glucose in your blood. Carbohydrate counting is a method for keeping track of how many carbohydrates you eat. Counting carbohydrates is important to keep your blood glucose at a healthy level, especially if you use insulin or take certain oral diabetes medicines. It is important to know how many carbohydrates you can safely have in each meal. This is different for every person. Your dietitian can help you calculate how many carbohydrates you should have at each meal and for snack. Foods that contain carbohydrates include:  Bread, cereal, rice, pasta, and crackers.  Potatoes and corn.  Peas, beans, and lentils.  Milk and yogurt.  Fruit and juice.  Desserts, such as cakes, cookies, ice cream, and candy.  How does alcohol affect me? Alcohol can cause a sudden decrease in blood  glucose (hypoglycemia), especially if you use insulin or take certain oral diabetes medicines. Hypoglycemia can be a life-threatening condition. Symptoms of hypoglycemia (sleepiness, dizziness, and confusion) are similar to symptoms of having too much alcohol. If your health care provider says that alcohol is safe for you, follow these guidelines:  Limit alcohol intake to no more than 1 drink per day for nonpregnant women and 2 drinks per day for men. One drink equals 12 oz of beer, 5 oz of wine, or 1 oz of hard liquor.  Do not drink on an empty stomach.  Keep yourself hydrated with water, diet soda, or unsweetened iced tea.  Keep in mind that regular soda, juice, and other mixers may contain a lot of sugar and must be counted as carbohydrates.  What are tips for following this plan? Reading food labels  Start by checking the serving size on the label. The amount of calories, carbohydrates, fats, and other nutrients listed on the label are based on one serving of the food. Many foods contain more than one serving per package.  Check the total grams (g) of carbohydrates in one serving. You can calculate the number of servings of carbohydrates in one serving by dividing the total carbohydrates by 15. For example, if a food has 30 g of total carbohydrates, it would be equal to 2 servings of carbohydrates.  Check the number of grams (g) of saturated and trans fats in one serving. Choose foods that have low or no amount of these fats.  Check the number of milligrams (mg) of sodium in one serving. Most people   should limit total sodium intake to less than 2,300 mg per day.  Always check the nutrition information of foods labeled as "low-fat" or "nonfat". These foods may be higher in added sugar or refined carbohydrates and should be avoided.  Talk to your dietitian to identify your daily goals for nutrients listed on the label. Shopping  Avoid buying canned, premade, or processed foods. These  foods tend to be high in fat, sodium, and added sugar.  Shop around the outside edge of the grocery store. This includes fresh fruits and vegetables, bulk grains, fresh meats, and fresh dairy. Cooking  Use low-heat cooking methods, such as baking, instead of high-heat cooking methods like deep frying.  Cook using healthy oils, such as olive, canola, or sunflower oil.  Avoid cooking with butter, cream, or high-fat meats. Meal planning  Eat meals and snacks regularly, preferably at the same times every day. Avoid going long periods of time without eating.  Eat foods high in fiber, such as fresh fruits, vegetables, beans, and whole grains. Talk to your dietitian about how many servings of carbohydrates you can eat at each meal.  Eat 4-6 ounces of lean protein each day, such as lean meat, chicken, fish, eggs, or tofu. 1 ounce is equal to 1 ounce of meat, chicken, or fish, 1 egg, or 1/4 cup of tofu.  Eat some foods each day that contain healthy fats, such as avocado, nuts, seeds, and fish. Lifestyle   Check your blood glucose regularly.  Exercise at least 30 minutes 5 or more days each week, or as told by your health care provider.  Take medicines as told by your health care provider.  Do not use any products that contain nicotine or tobacco, such as cigarettes and e-cigarettes. If you need help quitting, ask your health care provider.  Work with a counselor or diabetes educator to identify strategies to manage stress and any emotional and social challenges. What are some questions to ask my health care provider?  Do I need to meet with a diabetes educator?  Do I need to meet with a dietitian?  What number can I call if I have questions?  When are the best times to check my blood glucose? Where to find more information:  American Diabetes Association: diabetes.org/food-and-fitness/food  Academy of Nutrition and Dietetics:  www.eatright.org/resources/health/diseases-and-conditions/diabetes  National Institute of Diabetes and Digestive and Kidney Diseases (NIH): www.niddk.nih.gov/health-information/diabetes/overview/diet-eating-physical-activity Summary  A healthy meal plan will help you control your blood glucose and maintain a healthy lifestyle.  Working with a diet and nutrition specialist (dietitian) can help you make a meal plan that is best for you.  Keep in mind that carbohydrates and alcohol have immediate effects on your blood glucose levels. It is important to count carbohydrates and to use alcohol carefully. This information is not intended to replace advice given to you by your health care provider. Make sure you discuss any questions you have with your health care provider. Document Released: 07/21/2005 Document Revised: 11/28/2016 Document Reviewed: 11/28/2016 Elsevier Interactive Patient Education  2018 Elsevier Inc.  

## 2018-07-24 ENCOUNTER — Encounter: Payer: Self-pay | Admitting: Family Medicine

## 2018-07-24 ENCOUNTER — Ambulatory Visit: Payer: BLUE CROSS/BLUE SHIELD | Attending: Family Medicine | Admitting: Family Medicine

## 2018-07-24 VITALS — BP 144/88 | HR 102 | Temp 98.5°F | Resp 18 | Ht 67.0 in | Wt 225.0 lb

## 2018-07-24 DIAGNOSIS — Z794 Long term (current) use of insulin: Secondary | ICD-10-CM | POA: Insufficient documentation

## 2018-07-24 DIAGNOSIS — Z8249 Family history of ischemic heart disease and other diseases of the circulatory system: Secondary | ICD-10-CM | POA: Insufficient documentation

## 2018-07-24 DIAGNOSIS — Z833 Family history of diabetes mellitus: Secondary | ICD-10-CM | POA: Insufficient documentation

## 2018-07-24 DIAGNOSIS — E1165 Type 2 diabetes mellitus with hyperglycemia: Secondary | ICD-10-CM

## 2018-07-24 DIAGNOSIS — Z79899 Other long term (current) drug therapy: Secondary | ICD-10-CM | POA: Diagnosis not present

## 2018-07-24 DIAGNOSIS — I10 Essential (primary) hypertension: Secondary | ICD-10-CM

## 2018-07-24 DIAGNOSIS — Z9889 Other specified postprocedural states: Secondary | ICD-10-CM | POA: Insufficient documentation

## 2018-07-24 LAB — GLUCOSE, POCT (MANUAL RESULT ENTRY): POC Glucose: 145 mg/dL — AB (ref 70–99)

## 2018-07-24 MED ORDER — TRUEPLUS LANCETS 28G MISC: 11 refills | Status: DC

## 2018-07-24 MED ORDER — GLUCOSE BLOOD VI STRP: ORAL_STRIP | 12 refills | Status: DC

## 2018-07-24 MED ORDER — TRUE METRIX AIR GLUCOSE METER W/DEVICE KIT: 1.0000 | PACK | Freq: Two times a day (BID) | 0 refills | Status: DC

## 2018-07-24 MED FILL — FUROSEMIDE 20 MG TABLET: 20 | 30 days supply | Qty: 30 | Fill #1

## 2018-07-24 MED FILL — CONTOUR NEXT STRIPS: 30 days supply | Qty: 100 | Fill #0

## 2018-07-24 NOTE — Progress Notes (Signed)
Subjective:    Patient ID: Jeffrey Murphy, male    DOB: [birth year], 62 y.o.   MRN: [id number]  HPI 62 yo male seen in follow-up of recent new patient visit to obtain medications for his ongoing medical conditions including type 2 DM requiring the use of insulin and hypertension. Patient reports that his blood sugars are now much better controlled and he feels a lot better. Patient states his morning blood sugars are now always in the 160's or less. Patient denies any increased thirst and no urinary frequency.  Patient feels that his blood pressure is improved and he has had no headaches or dizziness related to his blood pressure. 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 Muscle cramping has resolved.   Past Medical History:  Diagnosis Date  . Abscess 12/2016   LEFT INGUINAL ABSCESS  . Arthritis   . Diabetes mellitus without complication ([place])   . Hypertension   . Wears glasses    reading   Past Surgical History:  Procedure Laterality Date  . COLONOSCOPY    . CYST REMOVAL NECK    . INGUINAL HERNIA REPAIR     right  . IRRIGATION AND DEBRIDEMENT ABSCESS Left 12/12/2016   Procedure: IRRIGATION AND DEBRIDEMENT ABSCESS;  Surgeon: Stark Klein, MD;  Location: [place];  Service: General;  Laterality: Left;  . JOINT REPLACEMENT     right total knee  . PILONIDAL CYST EXCISION     back  . QUADRICEPS TENDON REPAIR Right 12/24/2014   Procedure: RIGHT QUADRICEP TENDON REPAIR ;  Surgeon: Marianna Payment, MD;  Location: [place];  Service: Orthopedics;  Laterality: Right;  . REPLACEMENT TOTAL KNEE     Family History  Problem Relation Age of Onset  . Cancer Mother   . Hypertension Mother   . Diabetes Father   Social History: patient has never smoked tobacco; patient has not used any drugs recently; patient is currently working through [employer]     Review of Systems       Objective:   Physical Exam BP (!) 144/88 (BP Location: Left Arm, Patient Position: Sitting, Cuff Size: Normal)   Pulse (!) 102   Temp 98.5 F (36.9 C) (Oral)   Resp 18   Ht '5\' 7"'$  (1.702 m)   Wt 225 lb (102.1 kg)   SpO2 95%   BMI 35.24 kg/m  Vital signs and nurses notes reviewed Gen- WNWD overweight older male in NAD Neck- supple, no LAD, no carotid bruits Lungs- CTA CV- RRR ABD- soft and nontender Back- no CVA tenderness Ext- no edema       Assessment & Plan:  1. Uncontrolled type 2 diabetes mellitus with hyperglycemia ([place]) Patient reports improved control of diabetes since being able to get back on his medications. Patient with non-fasting glucose of 145 at today's visit. Patient will continue his current medication regimen and continue to monitor his blood sugars. - Glucose (CBG) - Comprehensive metabolic panel - glucose blood (TRUE METRIX BLOOD GLUCOSE TEST) test strip; Use as instructed  Dispense: 100 each; Refill: 12 - TRUEPLUS LANCETS 28G MISC; Use twice daily to check blood sugar  Dispense: 100 each; Refill: 11 - Blood Glucose Monitoring Suppl (TRUE METRIX AIR GLUCOSE METER) w/Device KIT; 1 Device by Does not apply route 2 (two) times daily. Use to check blood sugars twice per day  Dispense: 1 kit; Refill: 0  2. Essential hypertension Blood pressure is improved from his last visit and patient will continue his current medications including lisinopril and lasix  3. Encounter for long-term (current) use of medications CMP will be done in follow-up of DM and long tern use of medications - Comprehensive metabolic panel  An After Visit Summary was printed and given to the patient.  Return in about 3 months (around 10/23/2018) for dm.

## 2018-07-25 LAB — COMPREHENSIVE METABOLIC PANEL WITH GFR
ALT: 38 IU/L (ref 0–44)
AST: 32 IU/L (ref 0–40)
Albumin/Globulin Ratio: 1.7 (ref 1.2–2.2)
Albumin: 4.5 g/dL (ref 3.6–4.8)
Alkaline Phosphatase: 89 IU/L (ref 39–117)
BUN/Creatinine Ratio: 22 (ref 10–24)
BUN: 17 mg/dL (ref 8–27)
Bilirubin Total: 0.6 mg/dL (ref 0.0–1.2)
CO2: 24 mmol/L (ref 20–29)
Calcium: 9.6 mg/dL (ref 8.6–10.2)
Chloride: 100 mmol/L (ref 96–106)
Creatinine, Ser: 0.79 mg/dL (ref 0.76–1.27)
GFR calc Af Amer: 113 mL/min/1.73
GFR calc non Af Amer: 98 mL/min/1.73
Globulin, Total: 2.7 g/dL (ref 1.5–4.5)
Glucose: 131 mg/dL — ABNORMAL HIGH (ref 65–99)
Potassium: 4 mmol/L (ref 3.5–5.2)
Sodium: 139 mmol/L (ref 134–144)
Total Protein: 7.2 g/dL (ref 6.0–8.5)

## 2018-08-06 ENCOUNTER — Telehealth: Payer: Self-pay | Admitting: *Deleted

## 2018-08-06 NOTE — Telephone Encounter (Signed)
Medical Assistant left message on patient's home and cell voicemail. Voicemail states to give a call back to Natoria Archibald with [facility] at [phone]. Patient is aware of labs being normal 

## 2018-08-06 NOTE — Telephone Encounter (Signed)
-----   Message from Cain Saupe, MD sent at 07/25/2018 12:44 PM EDT ----- Please let patient know that his CMP was normal and his non-fasting blood sugar was 131.

## 2018-08-14 ENCOUNTER — Telehealth: Payer: Self-pay | Admitting: Family Medicine

## 2018-08-14 DIAGNOSIS — E1165 Type 2 diabetes mellitus with hyperglycemia: Secondary | ICD-10-CM

## 2018-08-14 NOTE — Telephone Encounter (Signed)
1) Medication(s) Requested (by name): glucotril Lasix  flomax novalin   2) Pharmacy of Choice: [facility] on [address]) Special Requests:   Approved medications will be sent to the pharmacy, we will reach out if there is an issue.  Requests made after 3pm may not be addressed until the following business day!  If a patient is unsure of the name of the medication(s) please note and ask patient to call back when they are able to provide all info, do not send to responsible party until all information is available!

## 2018-08-16 NOTE — Telephone Encounter (Signed)
Pt has refills available on all meds at [facility], he can call them to have them filled here or call [facility] and ask that they transfer the prescriptions for him.

## 2018-09-14 MED FILL — glipiZIDE 10 MG TABS: 10 | 30 days supply | Qty: 60 | Fill #1

## 2018-09-14 MED FILL — !NOVOLIN 70/30 100 UNITS/ML: (70-30) 100 | 25 days supply | Qty: 10 | Fill #1

## 2018-09-14 MED FILL — TAMSULOSIN HCL 0.4 MG CAP: 0.4 | 30 days supply | Qty: 30 | Fill #1

## 2018-09-14 MED FILL — FUROSEMIDE 20 MG TABLET: 20 | 30 days supply | Qty: 30 | Fill #2

## 2018-10-08 ENCOUNTER — Ambulatory Visit: Payer: BLUE CROSS/BLUE SHIELD | Admitting: Family Medicine

## 2018-11-12 MED FILL — FUROSEMIDE 20 MG TABLET: 20 | 30 days supply | Qty: 30 | Fill #3

## 2018-11-12 MED FILL — glipiZIDE 10 MG TABS: 10 | 30 days supply | Qty: 60 | Fill #2

## 2018-11-12 MED FILL — TAMSULOSIN HCL 0.4 MG CAP: 0.4 | 30 days supply | Qty: 30 | Fill #2

## 2018-11-14 MED FILL — !NOVOLIN 70/30 100 UNITS/ML: (70-30) 100 | 25 days supply | Qty: 10 | Fill #2

## 2018-11-29 ENCOUNTER — Ambulatory Visit: Payer: Self-pay | Admitting: Family Medicine

## 2018-12-13 ENCOUNTER — Ambulatory Visit: Payer: BLUE CROSS/BLUE SHIELD | Attending: Family Medicine | Admitting: Family Medicine

## 2018-12-13 ENCOUNTER — Encounter: Payer: Self-pay | Admitting: Family Medicine

## 2018-12-13 VITALS — BP 125/89 | HR 97 | Temp 98.8°F | Resp 18 | Ht 69.0 in | Wt 221.0 lb

## 2018-12-13 DIAGNOSIS — K047 Periapical abscess without sinus: Secondary | ICD-10-CM | POA: Diagnosis not present

## 2018-12-13 DIAGNOSIS — I10 Essential (primary) hypertension: Secondary | ICD-10-CM | POA: Diagnosis not present

## 2018-12-13 DIAGNOSIS — G479 Sleep disorder, unspecified: Secondary | ICD-10-CM | POA: Diagnosis not present

## 2018-12-13 DIAGNOSIS — E1165 Type 2 diabetes mellitus with hyperglycemia: Secondary | ICD-10-CM | POA: Diagnosis not present

## 2018-12-13 DIAGNOSIS — R399 Unspecified symptoms and signs involving the genitourinary system: Secondary | ICD-10-CM

## 2018-12-13 DIAGNOSIS — F1911 Other psychoactive substance abuse, in remission: Secondary | ICD-10-CM

## 2018-12-13 DIAGNOSIS — Z794 Long term (current) use of insulin: Secondary | ICD-10-CM

## 2018-12-13 LAB — POCT GLYCOSYLATED HEMOGLOBIN (HGB A1C): Hemoglobin A1C: 11 % — AB (ref 4.0–5.6)

## 2018-12-13 LAB — GLUCOSE, POCT (MANUAL RESULT ENTRY): POC Glucose: 223 mg/dL — AB (ref 70–99)

## 2018-12-13 MED ORDER — GLUCOSE BLOOD VI STRP: ORAL_STRIP | 12 refills | Status: DC

## 2018-12-13 MED ORDER — TAMSULOSIN HCL 0.4 MG PO CAPS: 0.4000 mg | ORAL_CAPSULE | Freq: Every day | ORAL | 1 refills | Status: DC

## 2018-12-13 MED ORDER — INSULIN SYRINGES (DISPOSABLE) U-100 0.5 ML MISC: 6 refills | Status: AC

## 2018-12-13 MED ORDER — TRUE METRIX AIR GLUCOSE METER W/DEVICE KIT: 1.0000 | PACK | Freq: Two times a day (BID) | 0 refills | Status: DC

## 2018-12-13 MED ORDER — TRUEPLUS LANCETS 28G MISC: 11 refills | Status: DC

## 2018-12-13 MED ORDER — TRAZODONE HCL 50 MG PO TABS: 25.0000 mg | ORAL_TABLET | Freq: Every evening | ORAL | 3 refills | Status: DC | PRN

## 2018-12-13 MED ORDER — LISINOPRIL 10 MG PO TABS: 10.0000 mg | ORAL_TABLET | Freq: Every day | ORAL | 1 refills | Status: DC

## 2018-12-13 MED ORDER — GLIPIZIDE 10 MG PO TABS: 10.0000 mg | ORAL_TABLET | Freq: Two times a day (BID) | ORAL | 1 refills | Status: DC

## 2018-12-13 MED ORDER — INSULIN NPH ISOPHANE & REGULAR (70-30) 100 UNIT/ML ~~LOC~~ SUSP: 20.0000 [IU] | Freq: Two times a day (BID) | SUBCUTANEOUS | 6 refills | Status: DC

## 2018-12-13 MED ORDER — AMOXICILLIN 875 MG PO TABS: 875.0000 mg | ORAL_TABLET | Freq: Two times a day (BID) | ORAL | 0 refills | Status: DC

## 2018-12-13 MED ORDER — FUROSEMIDE 20 MG PO TABS: 20.0000 mg | ORAL_TABLET | Freq: Every day | ORAL | 1 refills | Status: DC

## 2018-12-13 MED ORDER — MICROLET LANCETS MISC: 1.0000 | Freq: Three times a day (TID) | 12 refills | Status: DC

## 2018-12-13 MED FILL — traZODone HCL 50 MG TABS: 50 | 30 days supply | Qty: 30 | Fill #0

## 2018-12-13 MED FILL — FUROSEMIDE 20 MG TABLET: 20 | 30 days supply | Qty: 30 | Fill #4

## 2018-12-13 MED FILL — LISINOPRIL 10 MG TABS: 10 | 30 days supply | Qty: 30 | Fill #0

## 2018-12-13 MED FILL — NOVOLIN 70/30 100 UNITS/ML: (70-30) 100 | 25 days supply | Qty: 10 | Fill #3

## 2018-12-13 MED FILL — TRUEPLUS SYR 0.5ML 30GX5/16: 30G X 5/16" | 30 days supply | Qty: 100 | Fill #0

## 2018-12-13 MED FILL — AMOXICILLIN 875 MG TABS: 875 | 10 days supply | Qty: 20 | Fill #0

## 2018-12-13 MED FILL — TAMSULOSIN HCL 0.4 MG CAP: 0.4 | 30 days supply | Qty: 30 | Fill #3

## 2018-12-13 MED FILL — glipiZIDE 10 MG TABS: 10 | 30 days supply | Qty: 60 | Fill #3

## 2018-12-13 NOTE — Progress Notes (Signed)
Patient needs a refill override for lost medications.
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
## 2018-12-14 LAB — BASIC METABOLIC PANEL WITH GFR
BUN/Creatinine Ratio: 20 (ref 10–24)
BUN: 13 mg/dL (ref 8–27)
CO2: 24 mmol/L (ref 20–29)
Calcium: 9.2 mg/dL (ref 8.6–10.2)
Chloride: 98 mmol/L (ref 96–106)
Creatinine, Ser: 0.66 mg/dL — ABNORMAL LOW (ref 0.76–1.27)
GFR calc Af Amer: 121 mL/min/1.73
GFR calc non Af Amer: 105 mL/min/1.73
Glucose: 207 mg/dL — ABNORMAL HIGH (ref 65–99)
Potassium: 4.5 mmol/L (ref 3.5–5.2)
Sodium: 138 mmol/L (ref 134–144)

## 2018-12-18 ENCOUNTER — Telehealth: Payer: Self-pay | Admitting: *Deleted

## 2018-12-18 NOTE — Telephone Encounter (Signed)
Patient verified DOB Patient is aware of CMP being normal except for needing to take DM medications as prescribed and follow a healthy eating diet. Patient had no further questions.

## 2018-12-18 NOTE — Telephone Encounter (Signed)
-----   Message from Cain Saupe, MD sent at 12/15/2018  5:02 PM EST ----- Please notify patient that his basic metabolic panel was normal with the exception of blood sugar/glucose being elevated at 207

## 2018-12-28 ENCOUNTER — Telehealth: Payer: Self-pay | Admitting: Licensed Clinical Social Worker

## 2018-12-28 NOTE — Telephone Encounter (Signed)
Call placed to patient. LCSWA informed patient of consult from PCP to address substance use resources and/or community resources. Patient shared that he has been sober for three months. 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 Pt has made amends with wife and is staying in the home.   LCSWA inquired about his mental health. Pt reports an elevation in depression due to financial strain and difficulty re-certifying for food stamps. He was successful in identifying healthy coping skills to improve mood and decrease symptoms. No report of SI/HI.   LCSWA validated patient's frustration with DSS. Encouragement was provided. Pt was informed of [employer]. Supportive resources for food insecurity and employment was mailed to patient's address on file, per pt request. No additional concerns noted.

## 2019-01-18 MED FILL — NOVOLIN 70/30 100 UNITS/ML: (70-30) 100 | 25 days supply | Qty: 10 | Fill #4

## 2019-01-25 ENCOUNTER — Telehealth: Payer: Self-pay | Admitting: Family Medicine

## 2019-01-25 ENCOUNTER — Other Ambulatory Visit: Payer: Self-pay | Admitting: Family Medicine

## 2019-01-25 DIAGNOSIS — E1165 Type 2 diabetes mellitus with hyperglycemia: Secondary | ICD-10-CM

## 2019-01-25 MED ORDER — LISINOPRIL 20 MG PO TABS: 20.0000 mg | ORAL_TABLET | Freq: Every day | ORAL | 0 refills | Status: DC

## 2019-01-25 MED FILL — glipiZIDE 10 MG TABS: 10 | 90 days supply | Qty: 180 | Fill #0

## 2019-01-25 MED FILL — LISINOPRIL 20 MG TABLET: 20 | 90 days supply | Qty: 90 | Fill #0

## 2019-01-25 MED FILL — FUROSEMIDE 20 MG TABLET: 20 | 30 days supply | Qty: 30 | Fill #5

## 2019-01-25 NOTE — Telephone Encounter (Signed)
Please let patient know that I will send in the 20 mg dose of lisinopril and he should try to have his blood pressure rechecked-he can call to schedule a nurse visit

## 2019-01-25 NOTE — Progress Notes (Signed)
Patient ID: Jeffrey Murphy, male   DOB: 07/17/1956, 63 y.o.   MRN: [id number]   Phone call message received from patient that he feels as if the 10 mg dose of lisinopril is not controlling his blood pressure. His lisinopril dose will be increased to 20 mg daily and that should schedule a nurse visit for recheck of his blood pressure a few weeks after starting the increased dose

## 2019-01-25 NOTE — Telephone Encounter (Signed)
Patient wants 20 mg of lisinopril says the 10 isn't working.

## 2019-01-25 NOTE — Telephone Encounter (Signed)
Please inform patient of medication being increased to 20mg . Patient needs to have his blood pressure checked at least 2 weeks after starting.

## 2019-03-06 ENCOUNTER — Encounter (HOSPITAL_COMMUNITY): Payer: Self-pay | Admitting: *Deleted

## 2019-03-06 ENCOUNTER — Emergency Department (HOSPITAL_COMMUNITY)
Admission: EM | Admit: 2019-03-06 | Discharge: 2019-03-06 | Disposition: A | Discharge status: Home / Self Care | Payer: BLUE CROSS/BLUE SHIELD | Attending: Emergency Medicine | Admitting: Emergency Medicine

## 2019-03-06 DIAGNOSIS — K029 Dental caries, unspecified: Secondary | ICD-10-CM | POA: Insufficient documentation

## 2019-03-06 DIAGNOSIS — Z79899 Other long term (current) drug therapy: Secondary | ICD-10-CM | POA: Insufficient documentation

## 2019-03-06 DIAGNOSIS — E119 Type 2 diabetes mellitus without complications: Secondary | ICD-10-CM | POA: Diagnosis not present

## 2019-03-06 DIAGNOSIS — I10 Essential (primary) hypertension: Secondary | ICD-10-CM | POA: Insufficient documentation

## 2019-03-06 DIAGNOSIS — Z794 Long term (current) use of insulin: Secondary | ICD-10-CM | POA: Diagnosis not present

## 2019-03-06 DIAGNOSIS — K0889 Other specified disorders of teeth and supporting structures: Secondary | ICD-10-CM | POA: Diagnosis present

## 2019-03-06 DIAGNOSIS — Z96651 Presence of right artificial knee joint: Secondary | ICD-10-CM | POA: Insufficient documentation

## 2019-03-06 MED ORDER — KETOROLAC TROMETHAMINE 30 MG/ML IJ SOLN
15.0000 mg | Freq: Once | INTRAMUSCULAR | Status: AC
  Administered 2019-03-06: 15 mg via INTRAMUSCULAR
  Filled 2019-03-06: qty 1

## 2019-03-06 MED ORDER — ACETAMINOPHEN 325 MG PO TABS
650.0000 mg | ORAL_TABLET | Freq: Once | ORAL | Status: AC
  Administered 2019-03-06: 16:00:00 650 mg via ORAL
  Filled 2019-03-06: qty 2

## 2019-03-06 MED ORDER — PENICILLIN V POTASSIUM 500 MG PO TABS: 500.0000 mg | ORAL_TABLET | Freq: Four times a day (QID) | ORAL | 0 refills | Status: AC

## 2019-03-06 MED ORDER — PENICILLIN V POTASSIUM 250 MG PO TABS
500.0000 mg | ORAL_TABLET | Freq: Once | ORAL | Status: AC
  Administered 2019-03-06: 500 mg via ORAL
  Filled 2019-03-06: qty 2

## 2019-03-06 NOTE — ED Triage Notes (Signed)
Pt in c/o L lower dental pain onset x 3 days, no relief with OTC, pt denies having a fever, pt denies n/v/d, A&O x4

## 2019-03-06 NOTE — Discharge Planning (Signed)
[facility] consulted in regards to medication assistance.  Pt has insurance coverage and is not eligible for [facility] program.  [facility] searched for Rx discount cards and found that [facility] to find that pricing is better at his pharmacy. No further CM needs communicated at this time.

## 2019-03-06 NOTE — ED Provider Notes (Signed)
[facility] EMERGENCY DEPARTMENT Provider Note   CSN: [id number] Arrival date & time: 03/06/19  1347    History   Chief Complaint Chief Complaint  Patient presents with  . Dental Pain    HPI AIREN STIEHL is a 63 y.o. male w PMHx T2DM, HTN, substance abuse, presenting to the ED with complaint of 3 days of left lower dental pain. Pt reports intermittent issues with dental pain for some time, attributed to his hx of drug use. He states this current episode is causing pain to radiate to his jaw. No fevers, difficulty breathing or swallowing, or drainage in mouth.  He has been treating his symptoms with ibuprofen without significant relief.  He has not taken his insulin in 3 days due to affordability, however reports compliance with glipizide and hypertension medications.  He states he does not currently have a dentist.      The history is provided by the patient.    Past Medical History:  Diagnosis Date  . Abscess 12/2016   LEFT INGUINAL ABSCESS  . Arthritis   . Diabetes mellitus without complication ([place])   . Hypertension   . Wears glasses    reading    Patient Active Problem List   Diagnosis Date Noted  . Inguinal abscess 12/12/2016  . Diabetes mellitus with complication ([place]) 48/54/6270  . Sepsis ([place]) 12/12/2016  . Drug abuse ([place])   . Hyperglycemia   . GERD (gastroesophageal reflux disease) 04/19/2016  . Hypertension 04/19/2016  . Anxiety 04/19/2016  . Rupture of right quadriceps tendon 12/24/2014  . Quadriceps tendon rupture 12/24/2014    Past Surgical History:  Procedure Laterality Date  . COLONOSCOPY    . CYST REMOVAL NECK    . INGUINAL HERNIA REPAIR     right  . IRRIGATION AND DEBRIDEMENT ABSCESS Left 12/12/2016   Procedure: IRRIGATION AND DEBRIDEMENT ABSCESS;  Surgeon: Stark Klein, MD;  Location: [facility];  Service: General;  Laterality: Left;  . JOINT REPLACEMENT     right total knee  . PILONIDAL CYST EXCISION     back  . QUADRICEPS  TENDON REPAIR Right 12/24/2014   Procedure: RIGHT QUADRICEP TENDON REPAIR ;  Surgeon: Marianna Payment, MD;  Location: [facility];  Service: Orthopedics;  Laterality: Right;  . REPLACEMENT TOTAL KNEE          Home Medications    Prior to Admission medications   Medication Sig Start Date End Date Taking? Authorizing Provider  furosemide (LASIX) 20 MG tablet Take 1 tablet (20 mg total) by mouth daily. 12/13/18  Yes Fulp, Cammie, MD  glipiZIDE (GLUCOTROL) 10 MG tablet Take 1 tablet (10 mg total) by mouth 2 (two) times daily before a meal. To lower blood sugars 12/13/18  Yes Fulp, Cammie, MD  insulin NPH-regular Human (NOVOLIN 70/30) (70-30) 100 UNIT/ML injection Inject 20 Units into the skin 2 (two) times daily with a meal. 12/13/18  Yes Fulp, Cammie, MD  lisinopril (PRINIVIL,ZESTRIL) 20 MG tablet Take 1 tablet (20 mg total) by mouth daily. To lower blood pressure 01/25/19  Yes Fulp, Cammie, MD  tamsulosin (FLOMAX) 0.4 MG CAPS capsule Take 1 capsule (0.4 mg total) by mouth daily. 12/13/18  Yes Fulp, Cammie, MD  traZODone (DESYREL) 50 MG tablet Take 0.5-1 tablets (25-50 mg total) by mouth at bedtime as needed for sleep. 12/13/18  Yes Fulp, Cammie, MD  amoxicillin (AMOXIL) 875 MG tablet Take 1 tablet (875 mg total) by mouth 2 (two) times daily. Take after  eating Patient not taking: Reported on 03/06/2019 12/13/18   Antony Blackbird, MD  blood glucose meter kit and supplies KIT Dispense based on patient and insurance preference. Use up to four times daily as directed. (FOR ICD-9 250.00, 250.01). 12/17/16   Rai, Ripudeep K, MD  Blood Glucose Monitoring Suppl (CONTOUR NEXT MONITOR) w/Device KIT 1 each by Does not apply route 3 (three) times daily. 06/22/18   Fulp, Cammie, MD  glucose blood (CONTOUR NEXT TEST) test strip Use as instructed 06/22/18   Fulp, Cammie, MD  Insulin Syringes, Disposable, U-100 0.5 ML MISC Use as directed for insulin 12/13/18   Fulp, Cammie, MD  Lancet Devices (MICROLET NEXT LANCING  DEVICE) MISC 1 each by Does not apply route 3 (three) times daily. 06/22/18   Fulp, Cammie, MD  MICROLET LANCETS MISC 1 each by Subdermal route 3 (three) times daily. 12/13/18   Fulp, Cammie, MD  penicillin v potassium (VEETID) 500 MG tablet Take 1 tablet (500 mg total) by mouth 4 (four) times daily for 7 days. 03/06/19 03/13/19  Robinson, [place] N, PA-C  TRUEPLUS LANCETS 28G MISC Use twice daily to check blood sugar 12/13/18   Antony Blackbird, MD    Family History Family History  Problem Relation Age of Onset  . Cancer Mother   . Hypertension Mother   . Diabetes Father     Social History Social History   Tobacco Use  . Smoking status: Never Smoker  . Smokeless tobacco: Never Used  Substance Use Topics  . Alcohol use: Yes    Comment: drinks every other day  . Drug use: Yes    Frequency: 5.0 times per week    Types: Marijuana, Cocaine, "Crack" cocaine    Comment: last use months     Allergies   Patient has no known allergies.   Review of Systems Review of Systems  Constitutional: Negative for fever.  HENT: Positive for dental problem.      Physical Exam Updated Vital Signs BP (!) 152/76 (BP Location: Right Arm)   Pulse (!) 103   Temp 98.2 F (36.8 C) (Oral)   Resp 14   SpO2 94%   Physical Exam Vitals signs and nursing note reviewed.  Constitutional:      Appearance: He is well-developed.  HENT:     Head: Normocephalic and atraumatic.     Mouth/Throat:     Comments: Dental decay throughout.  Left lower molars with large decay and associated tenderness.  There is some mild gingival tenderness, however no fluctuance to suggest abscess.  No sublingual edema or tenderness.  Tolerating secretions. Eyes:     Conjunctiva/sclera: Conjunctivae normal.  Cardiovascular:     Rate and Rhythm: Normal rate and regular rhythm.  Pulmonary:     Effort: Pulmonary effort is normal.     Breath sounds: Normal breath sounds. No stridor.  Neurological:     Mental Status: He is alert.   Psychiatric:        Mood and Affect: Mood normal.        Behavior: Behavior normal.      ED Treatments / Results  Labs (all labs ordered are listed, but only abnormal results are displayed) Labs Reviewed - No data to display  EKG None  Radiology No results found.  Procedures Procedures (including critical care time)  Medications Ordered in ED Medications  acetaminophen (TYLENOL) tablet 650 mg (has no administration in time range)  penicillin v potassium (VEETID) tablet 500 mg (has no administration in time range)  ketorolac (TORADOL) 30 MG/ML injection 15 mg (has no administration in time range)     Initial Impression / Assessment and Plan / ED Course  I have reviewed the triage vital signs and the nursing notes.  Pertinent labs & imaging results that were available during my care of the patient were reviewed by me and considered in my medical decision making (see chart for details).       Patient with dental caries.  No gross abscess.  VSS, afebrile, tolerating secretions. Exam unconcerning for peritonsillar abscess, Ludwig's angina or spread of infection.  Will treat with penicillin and pain medicine.  Consult placed to case management with request for any possible financial assistance regarding insulin.  At this time, there is not any current additional assistance available for insulin medication other than the pharmacy at his PCP clinic, [facility].  Discussed importance of low-carb diet and good hydration.  Encouraged to monitor his blood sugars at home.  Urged patient to follow-up with dentist. Pt safe for discharge.  Discussed results, findings, treatment and follow up. Patient advised of return precautions. Patient verbalized understanding and agreed with plan.  Final Clinical Impressions(s) / ED Diagnoses   Final diagnoses:  Pain due to dental caries    ED Discharge Orders         Ordered    penicillin v potassium (VEETID) 500 MG tablet  4  times daily     03/06/19 1507           Robinson, Martin[place] N, PA-C 03/06/19 1521    Carmin Muskrat, MD 03/06/19 1753

## 2019-03-06 NOTE — Discharge Instructions (Addendum)
Please read instructions below. Take the antibiotic, Penicillin V, 4 times per day until they are gone. You can take  tylenol and advil every 6 hours as needed for pain. It is important that you monitor your blood sugars and take your medications as prescribed.  High blood sugar can make it more difficult to fight infection. [facility] will be the cheapest place to obtain your insulin prescriptions at this time. Schedule an appointment with a dentist, using the dental resource guide attached. Return to the ER for difficulty swallowing or breathing, fever, or new or worsening symptoms.

## 2019-04-15 ENCOUNTER — Other Ambulatory Visit: Payer: Self-pay

## 2019-04-15 ENCOUNTER — Encounter: Payer: Self-pay | Admitting: Family Medicine

## 2019-04-15 ENCOUNTER — Ambulatory Visit: Payer: BLUE CROSS/BLUE SHIELD | Attending: Family Medicine | Admitting: Family Medicine

## 2019-04-15 ENCOUNTER — Other Ambulatory Visit: Payer: Self-pay | Admitting: Pharmacist

## 2019-04-15 VITALS — BP 115/80 | HR 96 | Temp 98.2°F | Ht 69.0 in | Wt 213.6 lb

## 2019-04-15 DIAGNOSIS — E1165 Type 2 diabetes mellitus with hyperglycemia: Secondary | ICD-10-CM

## 2019-04-15 DIAGNOSIS — G479 Sleep disorder, unspecified: Secondary | ICD-10-CM | POA: Diagnosis not present

## 2019-04-15 DIAGNOSIS — K047 Periapical abscess without sinus: Secondary | ICD-10-CM | POA: Diagnosis not present

## 2019-04-15 DIAGNOSIS — I10 Essential (primary) hypertension: Secondary | ICD-10-CM

## 2019-04-15 DIAGNOSIS — F1910 Other psychoactive substance abuse, uncomplicated: Secondary | ICD-10-CM

## 2019-04-15 LAB — POCT GLYCOSYLATED HEMOGLOBIN (HGB A1C): Hemoglobin A1C: 11.1 % — AB (ref 4.0–5.6)

## 2019-04-15 LAB — GLUCOSE, POCT (MANUAL RESULT ENTRY): POC Glucose: 260 mg/dL — AB (ref 70–99)

## 2019-04-15 MED ORDER — GLUCOSE BLOOD VI STRP: ORAL_STRIP | 11 refills | Status: AC

## 2019-04-15 MED ORDER — INSULIN GLARGINE 100 UNIT/ML SOLOSTAR PEN: 20.0000 [IU] | PEN_INJECTOR | Freq: Every day | SUBCUTANEOUS | 99 refills | Status: DC

## 2019-04-15 MED ORDER — TRAZODONE HCL 50 MG PO TABS: 50.0000 mg | ORAL_TABLET | Freq: Every evening | ORAL | 3 refills | Status: DC | PRN

## 2019-04-15 MED ORDER — MICROLET NEXT LANCING DEVICE MISC: 1.0000 | Freq: Three times a day (TID) | 12 refills | Status: AC

## 2019-04-15 MED ORDER — CONTOUR NEXT MONITOR W/DEVICE KIT: 1.0000 | PACK | Freq: Three times a day (TID) | 0 refills | Status: DC

## 2019-04-15 MED ORDER — FUROSEMIDE 20 MG PO TABS: 20.0000 mg | ORAL_TABLET | Freq: Every day | ORAL | 3 refills | Status: DC

## 2019-04-15 MED ORDER — MICROLET LANCETS MISC: 1.0000 | Freq: Three times a day (TID) | 12 refills | Status: AC

## 2019-04-15 MED ORDER — AMOXICILLIN 875 MG PO TABS: 875.0000 mg | ORAL_TABLET | Freq: Two times a day (BID) | ORAL | 0 refills | Status: DC

## 2019-04-15 MED FILL — AMOXICILLIN 875 MG TABS: 875 | 10 days supply | Qty: 20 | Fill #0

## 2019-04-15 MED FILL — traZODone HCL 50 MG TABS: 50 | 30 days supply | Qty: 30 | Fill #0

## 2019-04-15 MED FILL — CONTOUR NEXT METER: W/DEVICE | 1 days supply | Qty: 1 | Fill #0

## 2019-04-15 MED FILL — MICROLET LANCETS MISC: 25 days supply | Qty: 100 | Fill #0

## 2019-04-15 MED FILL — FUROSEMIDE 20 MG TABS: 20 | 30 days supply | Qty: 30 | Fill #0

## 2019-04-15 MED FILL — LANTUS SOLOSTAR 100 UNITS/M: 100 | 30 days supply | Qty: 6 | Fill #0

## 2019-04-15 MED FILL — CONTOUR NEXT STRIPS: 25 days supply | Qty: 100 | Fill #0

## 2019-04-15 NOTE — Progress Notes (Signed)
Established Patient Office Visit  Subjective:  Patient ID: Jeffrey Murphy, male    DOB: May 19, 1956  Age: 63 y.o. MRN: [id number]  CC:  Chief Complaint  Patient presents with  . Diabetes    HPI OTIS PORTAL presents for follow-up of chronic medical issues including diabetes and hypertension and patient reports ongoing substance abuse with the use of cocaine yesterday.  He reports that due to the COVID-19 pandemic he has been unable to find work and has been unable to afford his medications.  Patient states that he has been out of insulin for a few weeks.  When he has checked his blood sugars, blood sugars have been in the 200s.  Patient has had some increased thirst and at times urinary frequency.  He also continues to have ongoing issues with erectile dysfunction.  He is also not taking his blood pressure medication every day in order to stretch out the medication due to the cost.  He has been unable to afford to have his Lasix refilled and he is having some issues with swelling in his legs.        Patient also continues to have issues with poor dentition and was seen in the emergency department in April due to tooth pain and placed on an antibiotic.  He reports that he continues to have tooth pain and believes that he likely has an infected tooth.  He has been unable to afford dental follow-up.  He reports that he has issues with sleep.  Patient states that he often has difficulty falling asleep and staying asleep.  He is not sure if this is due to stress.  Patient states that he was living in the basement of the home that he shares with his wife with whom he is undergoing a separation/divorce.  He reports that his wife continues to use drugs and he states that because she would use drugs in front of him that he restarted drug use after becoming clean.  He is currently living in a hotel room with another woman but this moment also has issues with substance abuse.  Past Medical History:  Diagnosis  Date  . Abscess 12/2016   LEFT INGUINAL ABSCESS  . Arthritis   . Diabetes mellitus without complication ([place])   . Hypertension   . Wears glasses    reading    Past Surgical History:  Procedure Laterality Date  . COLONOSCOPY    . CYST REMOVAL NECK    . INGUINAL HERNIA REPAIR     right  . IRRIGATION AND DEBRIDEMENT ABSCESS Left 12/12/2016   Procedure: IRRIGATION AND DEBRIDEMENT ABSCESS;  Surgeon: Stark Klein, MD;  Location: [facility];  Service: General;  Laterality: Left;  . JOINT REPLACEMENT     right total knee  . PILONIDAL CYST EXCISION     back  . QUADRICEPS TENDON REPAIR Right 12/24/2014   Procedure: RIGHT QUADRICEP TENDON REPAIR ;  Surgeon: Marianna Payment, MD;  Location: [facility];  Service: Orthopedics;  Laterality: Right;  . REPLACEMENT TOTAL KNEE      Family History  Problem Relation Age of Onset  . Cancer Mother   . Hypertension Mother   . Diabetes Father     Social History   Tobacco Use  . Smoking status: Never Smoker  . Smokeless tobacco: Never Used  Substance Use Topics  . Alcohol use: Yes    Comment: drinks every other day  . Drug use: Yes    Frequency: 5.0  times per week    Types: Marijuana, Cocaine, "Crack" cocaine    Comment: last use months    Outpatient Medications Prior to Visit  Medication Sig Dispense Refill  . amoxicillin (AMOXIL) 875 MG tablet Take 1 tablet (875 mg total) by mouth 2 (two) times daily. Take after eating (Patient not taking: Reported on 03/06/2019) 20 tablet 0  . blood glucose meter kit and supplies KIT Dispense based on patient and insurance preference. Use up to four times daily as directed. (FOR ICD-9 250.00, 250.01). 1 each 0  . Blood Glucose Monitoring Suppl (CONTOUR NEXT MONITOR) w/Device KIT 1 each by Does not apply route 3 (three) times daily. 1 kit 0  . furosemide (LASIX) 20 MG tablet Take 1 tablet (20 mg total) by mouth daily. 90 tablet 1  . glipiZIDE (GLUCOTROL) 10 MG tablet Take 1 tablet (10 mg total)  by mouth 2 (two) times daily before a meal. To lower blood sugars 180 tablet 1  . glucose blood (CONTOUR NEXT TEST) test strip Use as instructed 100 each 12  . insulin NPH-regular Human (NOVOLIN 70/30) (70-30) 100 UNIT/ML injection Inject 20 Units into the skin 2 (two) times daily with a meal. 10 mL 6  . Insulin Syringes, Disposable, U-100 0.5 ML MISC Use as directed for insulin 100 each 6  . Lancet Devices (MICROLET NEXT LANCING DEVICE) MISC 1 each by Does not apply route 3 (three) times daily. 1 each 12  . lisinopril (PRINIVIL,ZESTRIL) 20 MG tablet Take 1 tablet (20 mg total) by mouth daily. To lower blood pressure 90 tablet 0  . MICROLET LANCETS MISC 1 each by Subdermal route 3 (three) times daily. 100 each 12  . tamsulosin (FLOMAX) 0.4 MG CAPS capsule Take 1 capsule (0.4 mg total) by mouth daily. 90 capsule 1  . traZODone (DESYREL) 50 MG tablet Take 0.5-1 tablets (25-50 mg total) by mouth at bedtime as needed for sleep. 30 tablet 3  . TRUEPLUS LANCETS 28G MISC Use twice daily to check blood sugar 100 each 11   No facility-administered medications prior to visit.     No Known Allergies  ROS Review of Systems  Constitutional: Positive for fatigue. Negative for chills and fever.  HENT: Negative for sore throat and trouble swallowing.   Eyes: Negative for photophobia and visual disturbance.  Respiratory: Positive for cough (occasiona, mild and nonproductive) and shortness of breath (occasional).   Cardiovascular: Positive for leg swelling. Negative for chest pain and palpitations.  Gastrointestinal: Negative for abdominal pain, constipation, diarrhea and nausea.  Endocrine: Positive for polydipsia. Negative for polyphagia and polyuria.  Genitourinary: Negative for dysuria and frequency.  Musculoskeletal: Positive for arthralgias. Negative for back pain.  Neurological: Negative for dizziness and headaches.  Hematological: Negative for adenopathy. Does not bruise/bleed easily.   Psychiatric/Behavioral: Positive for sleep disturbance. Negative for self-injury and suicidal ideas. The patient is nervous/anxious.       Objective:    Physical Exam  Constitutional: He is oriented to person, place, and time. He appears well-developed and well-nourished.  HENT:  Mouth/Throat: Dental caries present. Posterior oropharyngeal edema (mild) and posterior oropharyngeal erythema (mild) present.  Neck: Neck supple. No thyromegaly present.  Cardiovascular: Normal rate and regular rhythm.  Bilateral LE edema below the knees with indentation at sock line and mild pitting  Pulmonary/Chest: Effort normal and breath sounds normal.  Abdominal: Soft. There is no abdominal tenderness. There is no rebound and no guarding.  Lymphadenopathy:    He has no cervical adenopathy.  Neurological: He is alert and oriented to person, place, and time.  Skin: Skin is warm and dry.  Psychiatric: He has a normal mood and affect. His behavior is normal. Judgment and thought content normal.  Nursing note and vitals reviewed.   BP 115/80 (BP Location: Right Arm, Patient Position: Sitting, Cuff Size: Normal)   Pulse 96   Temp 98.2 F (36.8 C) (Oral)   Ht '5\' 9"'$  (1.753 m)   Wt 213 lb 9.6 oz (96.9 kg)   SpO2 95%   BMI 31.54 kg/m  Wt Readings from Last 3 Encounters:  12/13/18 221 lb (100.2 kg)  07/24/18 225 lb (102.1 kg)  07/06/18 223 lb 6.4 oz (101.3 kg)     Health Maintenance Due  Topic Date Due  . Hepatitis C Screening  06-01-1956  . PNEUMOCOCCAL POLYSACCHARIDE VACCINE AGE 53-64 HIGH RISK  07/09/1960  . FOOT EXAM  07/09/1968  . OPHTHALMOLOGY EXAM  07/09/1968  . TETANUS/TDAP  07/09/1977  . COLONOSCOPY  07/09/2008    There are no preventive care reminders to display for this patient.  No results found for: TSH Lab Results  Component Value Date   WBC 7.6 06/22/2018   HGB 16.8 06/22/2018   HCT 48.2 06/22/2018   MCV 86 06/22/2018   PLT 199 06/22/2018   Lab Results  Component Value  Date   NA 138 12/13/2018   K 4.5 12/13/2018   CO2 24 12/13/2018   GLUCOSE 207 (H) 12/13/2018   BUN 13 12/13/2018   CREATININE 0.66 (L) 12/13/2018   BILITOT 0.6 07/24/2018   ALKPHOS 89 07/24/2018   AST 32 07/24/2018   ALT 38 07/24/2018   PROT 7.2 07/24/2018   ALBUMIN 4.5 07/24/2018   CALCIUM 9.2 12/13/2018   ANIONGAP 16 (H) 11/30/2017   No results found for: CHOL No results found for: HDL No results found for: LDLCALC No results found for: TRIG No results found for: CHOLHDL Lab Results  Component Value Date   HGBA1C 11.0 (A) 12/13/2018      Assessment & Plan:  1. Essential hypertension Patient's blood pressure is actually controlled to today's visit.  He is provided with refill of Lasix to help with his peripheral edema which will also help with the blood pressure.  Electrolytes will be checked as part of CMP.  2. Uncontrolled type 2 diabetes mellitus with hyperglycemia ([place]) Patient's hemoglobin A1c is elevated at 11.1 with random glucose of 260 and patient has not been following a diabetic diet and has been out of his insulin as well as not taking insulin on a daily basis due to a combination of inability to afford his medication as well as ongoing substance abuse.  Samples of Lantus were able to be obtained for the patient.  Prescription was sent to patient's pharmacy for diabetic testing supplies as patient also reports that he has lost his glucometer.  Patient will have CMP and lipid panel in follow-up of his diabetes. - HgB A1c - Glucose (CBG) - Lipid panel - Comprehensive metabolic panel - furosemide (LASIX) 20 MG tablet; Take 1 tablet (20 mg total) by mouth daily. If needed for leg swelling  Dispense: 30 tablet; Refill: 3 - Blood Glucose Monitoring Suppl (CONTOUR NEXT MONITOR) w/Device KIT; 1 each by Does not apply route 3 (three) times daily.  Dispense: 1 kit; Refill: 0 - Lancet Devices (MICROLET NEXT LANCING DEVICE) MISC; 1 each by Does not apply route 3 (three) times  daily.  Dispense: 1 each; Refill: 12 - Microlet Lancets  MISC; 1 each by Subdermal route 3 (three) times daily.  Dispense: 100 each; Refill: 12  3. Difficulty sleeping Prescription provided for trazodone 50 mg at bedtime help with sleep but also discussed with the patient that his substance abuse is also contributing to his sleep issues and that he should avoid the use of benzodiazepines or other sedative medication if he is taking prescribed medication for sleep. - traZODone (DESYREL) 50 MG tablet; Take 1 tablet (50 mg total) by mouth at bedtime as needed for sleep.  Dispense: 30 tablet; Refill: 3  4. Tooth infection Prescription provided for amoxicillin 875 mg twice daily x10 days to take after eating for tooth infection patient will attempt to arrange for dental follow-up - amoxicillin (AMOXIL) 875 MG tablet; Take 1 tablet (875 mg total) by mouth 2 (two) times daily. Take after eating  Dispense: 20 tablet; Refill: 0  5.  Substance abuse Patient at this time declines any/referrals to help with his current substance abuse.  Patient is aware of the risk of death due to overdose or contaminated drugs.  He is encouraged to obtain Narcan which he can get without a prescription.  He is aware that he can follow-up will call regarding the need for substance abuse treatment or go to the emergency department to request drug treatment program/rehabilitation.   An After Visit Summary was printed and given to the patient.   Follow-up: Return in about 6 weeks (around 05/27/2019) for DM.   Antony Blackbird, MD

## 2019-04-15 NOTE — Progress Notes (Signed)
Per pt he is here to discuss his DM, per pt he would like a meter for his DM  Per pt he can not afford his Lasix and skips doses for his insulin due to not being able to afford it.   Per pt he need to talk about what he can take for his sex life  Per pt he is needing refills.  Per pt he is not happy in his marriage right now, per pt his wife is going back to street drugs and he do not want to slip back up.

## 2019-04-16 LAB — LIPID PANEL
Chol/HDL Ratio: 3.3 ratio (ref 0.0–5.0)
Cholesterol, Total: 113 mg/dL (ref 100–199)
HDL: 34 mg/dL — ABNORMAL LOW
LDL Calculated: 58 mg/dL (ref 0–99)
Triglycerides: 104 mg/dL (ref 0–149)
VLDL Cholesterol Cal: 21 mg/dL (ref 5–40)

## 2019-04-16 LAB — COMPREHENSIVE METABOLIC PANEL WITH GFR
ALT: 18 IU/L (ref 0–44)
AST: 20 IU/L (ref 0–40)
Albumin/Globulin Ratio: 1.3 (ref 1.2–2.2)
Albumin: 4.3 g/dL (ref 3.8–4.9)
Alkaline Phosphatase: 100 IU/L (ref 39–117)
BUN/Creatinine Ratio: 14 (ref 10–24)
BUN: 11 mg/dL (ref 8–27)
Bilirubin Total: 0.5 mg/dL (ref 0.0–1.2)
CO2: 26 mmol/L (ref 20–29)
Calcium: 9.4 mg/dL (ref 8.6–10.2)
Chloride: 96 mmol/L (ref 96–106)
Creatinine, Ser: 0.8 mg/dL (ref 0.76–1.27)
GFR calc Af Amer: 112 mL/min/1.73
GFR calc non Af Amer: 97 mL/min/1.73
Globulin, Total: 3.4 g/dL (ref 1.5–4.5)
Glucose: 241 mg/dL — ABNORMAL HIGH (ref 65–99)
Potassium: 4.1 mmol/L (ref 3.5–5.2)
Sodium: 137 mmol/L (ref 134–144)
Total Protein: 7.7 g/dL (ref 6.0–8.5)

## 2019-05-29 ENCOUNTER — Encounter: Payer: Self-pay | Admitting: Family Medicine

## 2019-05-29 ENCOUNTER — Ambulatory Visit (HOSPITAL_BASED_OUTPATIENT_CLINIC_OR_DEPARTMENT_OTHER): Payer: BLUE CROSS/BLUE SHIELD | Admitting: Licensed Clinical Social Worker

## 2019-05-29 ENCOUNTER — Other Ambulatory Visit: Payer: Self-pay | Admitting: Pharmacist

## 2019-05-29 ENCOUNTER — Other Ambulatory Visit: Payer: Self-pay

## 2019-05-29 ENCOUNTER — Ambulatory Visit: Payer: BLUE CROSS/BLUE SHIELD | Attending: Family Medicine | Admitting: Family Medicine

## 2019-05-29 VITALS — BP 115/78 | HR 101 | Temp 98.8°F | Ht 69.0 in | Wt 211.0 lb

## 2019-05-29 DIAGNOSIS — F419 Anxiety disorder, unspecified: Secondary | ICD-10-CM

## 2019-05-29 DIAGNOSIS — Z598 Other problems related to housing and economic circumstances: Secondary | ICD-10-CM | POA: Diagnosis not present

## 2019-05-29 DIAGNOSIS — F331 Major depressive disorder, recurrent, moderate: Secondary | ICD-10-CM

## 2019-05-29 DIAGNOSIS — F332 Major depressive disorder, recurrent severe without psychotic features: Secondary | ICD-10-CM | POA: Diagnosis not present

## 2019-05-29 DIAGNOSIS — F1910 Other psychoactive substance abuse, uncomplicated: Secondary | ICD-10-CM | POA: Diagnosis not present

## 2019-05-29 DIAGNOSIS — F1990 Other psychoactive substance use, unspecified, uncomplicated: Secondary | ICD-10-CM | POA: Diagnosis not present

## 2019-05-29 DIAGNOSIS — E1165 Type 2 diabetes mellitus with hyperglycemia: Secondary | ICD-10-CM | POA: Diagnosis not present

## 2019-05-29 DIAGNOSIS — Z599 Problem related to housing and economic circumstances, unspecified: Secondary | ICD-10-CM

## 2019-05-29 LAB — GLUCOSE, POCT (MANUAL RESULT ENTRY): POC Glucose: 259 mg/dL — AB (ref 70–99)

## 2019-05-29 MED ORDER — LANTUS SOLOSTAR 100 UNIT/ML ~~LOC~~ SOPN: 25.0000 [IU] | PEN_INJECTOR | Freq: Every day | SUBCUTANEOUS | 99 refills | Status: DC

## 2019-05-29 MED ORDER — TRUEPLUS PEN NEEDLES 32G X 4 MM MISC: 11 refills | Status: DC

## 2019-05-29 MED FILL — LANTUS SOLOSTAR 100 UNITS/M: 100 | 24 days supply | Qty: 6 | Fill #0

## 2019-05-29 MED FILL — TRUEPLUS PEN NDL 32GX5/32: 32G X 4 MM | 25 days supply | Qty: 100 | Fill #0

## 2019-05-29 NOTE — Progress Notes (Signed)
Pt. Is here for DM follow up. Pt. Stated his blood sugar runs high around 300s.

## 2019-05-29 NOTE — Progress Notes (Signed)
Established Patient Office Visit  Subjective:  Patient ID: Jeffrey Murphy, male    DOB: Jun 07, 1956  Age: 63 y.o. MRN: [id number]  CC:  Chief Complaint  Patient presents with  . Follow-up    HPI Jeffrey Murphy presents for follow-up of type 2 diabetes and chronic medical issues.  Patient reports continued cocaine use.  Patient states that he has attempted to work however due to the heat and has uncontrolled blood sugars, he will have onset of muscle cramping and he has had episodes where he has urinated on himself.  Patient also with chronic pain in his feet from diabetic neuropathy which makes it difficult to stand for long periods.  Patient states that his blood sugars have been in the 200s to 300s.  Patient ran out of the Lantus samples provided at his last visit and obtained 70/30 insulin from [facility] as he states that he can get this medication without a prescription.  He was taking 20 units twice daily and he believes that this actually controlled his blood sugars better than the Lantus.  Because he cannot afford nutritious foods, he states that he ends up eating foods that are high in carbohydrates which keeps his blood sugars elevated.  He also feels that he has been dehydrated at times secondary to frequent urination and trying to work and stand in the heat.  Patient often feels weak.  He reports that he has gone back and forth between living in the basement of the home with his ex-wife who is also a drug user or staying in a hotel room with his girlfriend who he states is a Engineer, manufacturing systems".  Patient states that he is far behind in paying his bills as well as his mortgage and is afraid that he will be homeless once the current protections that were put in place because of the COVID-19 pandemic expire.  He believes that he may soon be evicted from his home.  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  He reports that he constantly feels  stressed and has difficulty sleeping.  He is willing to meet with the social worker while he is here to see if there are any resources.  He states that he recently talked with a friend who was homeless and was told that due to the COVID-19 pandemic, his friend was being housed in a hotel and would be assisted with finding housing and patient wonders if he may qualify for this type of program.  Past Medical History:  Diagnosis Date  . Abscess 12/2016   LEFT INGUINAL ABSCESS  . Arthritis   . Diabetes mellitus without complication ([place])   . Hypertension   . Wears glasses    reading    Past Surgical History:  Procedure Laterality Date  . COLONOSCOPY    . CYST REMOVAL NECK    . INGUINAL HERNIA REPAIR     right  . IRRIGATION AND DEBRIDEMENT ABSCESS Left 12/12/2016   Procedure: IRRIGATION AND DEBRIDEMENT ABSCESS;  Surgeon: Stark Klein, MD;  Location: [facility];  Service: General;  Laterality: Left;  . JOINT REPLACEMENT     right total knee  . PILONIDAL CYST EXCISION     back  . QUADRICEPS TENDON REPAIR Right 12/24/2014   Procedure: RIGHT QUADRICEP TENDON REPAIR ;  Surgeon: Marianna Payment, MD;  Location: [facility];  Service: Orthopedics;  Laterality: Right;  . REPLACEMENT TOTAL  KNEE      Family History  Problem Relation Age of Onset  . Cancer Mother   . Hypertension Mother   . Diabetes Father    Social History   Tobacco Use  . Smoking status: Never Smoker  . Smokeless tobacco: Never Used  Substance Use Topics  . Alcohol use: Not Currently    Comment: drinks every other day  . Drug use: Yes    Frequency: 5.0 times per week    Types: Marijuana, Cocaine, "Crack" cocaine    Comment: last use months     Outpatient Medications Prior to Visit  Medication Sig Dispense Refill  . amoxicillin (AMOXIL) 875 MG tablet Take 1 tablet (875 mg total) by mouth 2 (two) times daily. Take after eating 20 tablet 0  . blood glucose meter kit and supplies KIT Dispense based on  patient and insurance preference. Use up to four times daily as directed. (FOR ICD-9 250.00, 250.01). 1 each 0  . Blood Glucose Monitoring Suppl (CONTOUR NEXT MONITOR) w/Device KIT 1 each by Does not apply route 3 (three) times daily. 1 kit 0  . furosemide (LASIX) 20 MG tablet Take 1 tablet (20 mg total) by mouth daily. If needed for leg swelling 30 tablet 3  . glipiZIDE (GLUCOTROL) 10 MG tablet Take 1 tablet (10 mg total) by mouth 2 (two) times daily before a meal. To lower blood sugars 180 tablet 1  . glucose blood (CONTOUR NEXT TEST) test strip Use as instructed 100 each 11  . Insulin Glargine (LANTUS SOLOSTAR) 100 UNIT/ML Solostar Pen Inject 20 Units into the skin daily. 5 pen PRN  . insulin NPH-regular Human (NOVOLIN 70/30) (70-30) 100 UNIT/ML injection Inject 20 Units into the skin 2 (two) times daily with a meal. 10 mL 6  . lisinopril (PRINIVIL,ZESTRIL) 20 MG tablet Take 1 tablet (20 mg total) by mouth daily. To lower blood pressure 90 tablet 0  . Microlet Lancets MISC 1 each by Subdermal route 3 (three) times daily. 100 each 12  . Insulin Syringes, Disposable, U-100 0.5 ML MISC Use as directed for insulin (Patient not taking: Reported on 05/29/2019) 100 each 6  . Lancet Devices (MICROLET NEXT LANCING DEVICE) MISC 1 each by Does not apply route 3 (three) times daily. (Patient not taking: Reported on 05/29/2019) 1 each 12  . tamsulosin (FLOMAX) 0.4 MG CAPS capsule Take 1 capsule (0.4 mg total) by mouth daily. (Patient not taking: Reported on 05/29/2019) 90 capsule 1  . traZODone (DESYREL) 50 MG tablet Take 1 tablet (50 mg total) by mouth at bedtime as needed for sleep. (Patient not taking: Reported on 05/29/2019) 30 tablet 3   No facility-administered medications prior to visit.     No Known Allergies  ROS Review of Systems  Constitutional: Positive for fatigue. Negative for chills and fever.  HENT: Positive for dental problem (Continued dental issues/tooth pain is he cannot afford dental  follow-up). Negative for sore throat and trouble swallowing.   Eyes: Negative for photophobia and visual disturbance.  Respiratory: Positive for shortness of breath (Some shortness of breath with exertion). Negative for cough.   Cardiovascular: Negative for chest pain, palpitations and leg swelling.  Gastrointestinal: Negative for abdominal pain, constipation, diarrhea and nausea.  Endocrine: Positive for polydipsia and polyuria. Negative for polyphagia.  Genitourinary: Positive for frequency. Negative for dysuria.       Patient with urinary frequency and urinary incontinence  Musculoskeletal: Positive for arthralgias and back pain.  Neurological: Positive for dizziness, weakness, light-headedness  and numbness (Painful numbness in the feet). Negative for headaches.  Hematological: Negative for adenopathy. Does not bruise/bleed easily.  Psychiatric/Behavioral: Positive for sleep disturbance. Negative for self-injury and suicidal ideas. The patient is nervous/anxious.       Objective:    Physical Exam  Constitutional: He is oriented to person, place, and time. He appears well-developed and well-nourished.  Obese older male in no acute distress but slightly disheveled.  Patient is occasionally tearful when talking about his inability to stop his drug use and his financial issues  Neck: Neck supple. No JVD present. No thyromegaly present.  Cardiovascular: Normal rate.  Pulmonary/Chest: Effort normal and breath sounds normal.  Abdominal: Soft. There is no abdominal tenderness. There is no rebound and no guarding.  Truncal obesity  Musculoskeletal:     Comments: No CVA tenderness.  Patient with some left knee joint line tenderness.  Patient with healed surgical scar status post right knee replacement  Lymphadenopathy:    He has no cervical adenopathy.  Neurological: He is alert and oriented to person, place, and time.  Skin: Skin is warm and dry.  Psychiatric: Judgment and thought content  normal.  Patient is anxious and at times tearful when talking about his current stressors and inability to stop drug use  Nursing note and vitals reviewed.   BP 115/78 (BP Location: Right Arm, Patient Position: Sitting, Cuff Size: Normal)   Pulse (!) 101   Temp 98.8 F (37.1 C) (Oral)   Ht 5' 9" (1.753 m)   Wt 211 lb (95.7 kg)   SpO2 95%   BMI 31.16 kg/m  Wt Readings from Last 3 Encounters:  05/29/19 211 lb (95.7 kg)  04/15/19 213 lb 9.6 oz (96.9 kg)  12/13/18 221 lb (100.2 kg)     Health Maintenance Due  Topic Date Due  . Hepatitis C Screening  07/24/1956  . PNEUMOCOCCAL POLYSACCHARIDE VACCINE AGE 49-64 HIGH RISK  07/09/1960  . FOOT EXAM  07/09/1968  . OPHTHALMOLOGY EXAM  07/09/1968  . COLONOSCOPY  07/09/2008    There are no preventive care reminders to display for this patient.  No results found for: TSH Lab Results  Component Value Date   WBC 7.6 06/22/2018   HGB 16.8 06/22/2018   HCT 48.2 06/22/2018   MCV 86 06/22/2018   PLT 199 06/22/2018   Lab Results  Component Value Date   NA 137 04/15/2019   K 4.1 04/15/2019   CO2 26 04/15/2019   GLUCOSE 241 (H) 04/15/2019   BUN 11 04/15/2019   CREATININE 0.80 04/15/2019   BILITOT 0.5 04/15/2019   ALKPHOS 100 04/15/2019   AST 20 04/15/2019   ALT 18 04/15/2019   PROT 7.7 04/15/2019   ALBUMIN 4.3 04/15/2019   CALCIUM 9.4 04/15/2019   ANIONGAP 16 (H) 11/30/2017   Lab Results  Component Value Date   CHOL 113 04/15/2019   Lab Results  Component Value Date   HDL 34 (L) 04/15/2019   Lab Results  Component Value Date   LDLCALC 58 04/15/2019   Lab Results  Component Value Date   TRIG 104 04/15/2019   Lab Results  Component Value Date   CHOLHDL 3.3 04/15/2019   Lab Results  Component Value Date   HGBA1C 11.1 (A) 04/15/2019      Assessment & Plan:  1. Uncontrolled type 2 diabetes mellitus with hyperglycemia ([facility]) Prescription was sent to the pharmacy to see if there might be any additional samples of  Lantus Solostar pens and otherwise, patient  will meet with social worker today to also see if there are any resources available to help patient with cost of medications.  He can obtain 70/30 insulin at [facility] and discussed how he can gradually titrate his dose to gain better control of his blood sugars.  If blood sugars are better control, this may help with his symptoms of urinary incontinence.  Patient declined urinalysis to look for possible urinary tract infection as a cause/factor in his urinary incontinence. - Glucose (CBG) - Insulin Glargine (LANTUS SOLOSTAR) 100 UNIT/ML Solostar Pen; Inject 25 Units into the skin daily.  Dispense: 5 pen; Refill: PRN  2. Substance abuse ([place]) Patient with continued cocaine use.  Patient agreed to speak with social worker at today's visit regarding possible treatment programs.  Also discussed with the patient that he may wish to contact his pastor regarding any resources that may be available financially or with programs to help with substance abuse patient has reported in the past that speaking with his pastor and church involvement helped him stop drug use in the past. - Ambulatory referral to Social Work  3. Moderate episode of recurrent major depressive disorder ([place]) Patient will have social work consultation regarding his ongoing anxiety and depression as well as substance abuse which is likely a form of self treatment by the patient. - Ambulatory referral to Social Work  An After Visit Summary was printed and given to the patient.   Follow-up: Return in about 8 weeks (around 07/24/2019) for DM/chronic issues and as needed.   Antony Blackbird, MD

## 2019-05-31 MED FILL — glipiZIDE 10 MG TABS: 10 | 90 days supply | Qty: 180 | Fill #1

## 2019-05-31 MED FILL — NOVOLIN 70/30 100 UNITS/ML: (70-30) 100 | 25 days supply | Qty: 10 | Fill #5

## 2019-05-31 MED FILL — FUROSEMIDE 20 MG TABS: 20 | 30 days supply | Qty: 30 | Fill #1

## 2019-05-31 MED FILL — LISINOPRIL 10 MG TABS: 10 | 30 days supply | Qty: 60 | Fill #1

## 2019-06-03 ENCOUNTER — Telehealth: Payer: Self-pay | Admitting: Pharmacist

## 2019-06-03 MED FILL — CONTOUR NEXT STRIPS: 25 days supply | Qty: 100 | Fill #1

## 2019-06-03 NOTE — Telephone Encounter (Signed)
Please contact him. He is not supposed to do both. He said that the 70/30 was cheaper and seemed to work better but since he claimed to not be able to afford either at his recent visit, I did send a request to lab to see if Lantus Samples were available

## 2019-06-03 NOTE — Telephone Encounter (Signed)
Received request from pharmacy to reach out to pt regarding his insulin regimen. Pt reports taking both 70/30 BID and Lantus daily. This is not routine, however, pt reports that he can achieve better glycemic control with this new regimen. Denies hypoglycemia s/sx. I will forward this information to patient's PCP.

## 2019-06-03 NOTE — Telephone Encounter (Signed)
Pt called back. I informed him that he is to not take both Lantus and 70/30. Pt is agreeable but is adamant that his 70/30 works better for him. I reviewed Dr. Siri Cole note from last week's encounter. Patient was informed at that visit on safe self-titration of insulin to achieve goal blood sugar levels.   Pt reports that he remembers Dr. Chapman Fitch telling him this but wanted to "combine the effectiveness" of both insulins. I reiterated that he should stop Lantus and continue with 70/30 BID. Will route to Dr. Chapman Fitch.

## 2019-06-03 NOTE — Telephone Encounter (Signed)
Patient at his last visit said that he ran out of Lantus and restarted the use of 7030 which he felt worked better and discussed changes in 7030 regimen which could help further improve his blood sugars.  Prescription was sent to pharmacy for Lantus samples if available as patient reported difficulty affording his medications.  He however was not instructed to take both the Lantus and 7030.  He is likely having some issues with recall unfortunately due to his substance use

## 2019-06-03 NOTE — BH Specialist Note (Signed)
Integrated Behavioral Health Initial Visit  MRN: [id number] Name: Jeffrey Murphy  Number of [place] Clinician visits:: 1/6 Session Start time: 10:45 AM  Session End time: 11:15 AM Total time: 30 minutes  Type of Service: [place] Interpretor:No. Interpretor Name and Language: N/A   Warm Hand Off Completed.       SUBJECTIVE: Jeffrey Murphy is a 64 y.o. male accompanied by self Patient was referred by Dr. Chapman Fitch for depression. Patient reports the following symptoms/concerns: Pt reports increase in his depression triggered by financial strain, relapse from substance use, and strained relationship with his wife Duration of problem: Ongoing; Severity of problem: severe  OBJECTIVE: Mood: Anxious and Depressed and Affect: Depressed and Tearful Risk of harm to self or others: No plan to harm self or others Pt scored positive on phq9; however, denies active suicidal ideations. Protective factors identified and crisis intervention resources discussed   LIFE CONTEXT: Family and Social: Pt resides in basement of wife's residence or in his truck. 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 He receives retirement supplement ($344) and has a pending disability case with [employer] Self-Care: Pt has relapsed and has returned to using crack cocaine. States difficulty affording medications to assist in the management of chronic medical conditions Life Changes:  Pt reports increase in his depression triggered by financial strain, relapse from substance use, and strained relationship with his wife. States difficulty affording medications to assist in the management of chronic medical conditions  GOALS ADDRESSED: Patient will: 1. Reduce symptoms of: anxiety, depression and stress 2. Increase knowledge and/or ability of: self-management  skills  3. Demonstrate ability to: Increase motivation to adhere to plan of care and Decrease self-medicating behaviors  INTERVENTIONS: Interventions utilized: Motivational Interviewing, Supportive Counseling, Psychoeducation and/or Health Education and Link to [employer]  Standardized Assessments completed: GAD-7 and PHQ 2&9 with C-SSRS  ASSESSMENT: Patient currently experiencing an increase in his depression triggered by financial strain, relapse from substance use, and strained relationship with his wife. States difficulty affording medications to assist in the management of chronic medical conditions. Pt scored positive on phq9; however, denies active suicidal ideations. Protective factors identified and crisis intervention resources discussed    Patient may benefit from substance use treatment and psychotherapy. Pt reports feelings of guilt and shame regarding relapse. LCSW provided support and encouragement. The cycle of substance use was discussed to encourage normalization and elicit hope. Pt is not currently interested in substance use treatment due to financial strain. He provided consent for LCSW to complete Legal Aid referral to assist with obtaining stimulus check and provide foreclosure prevention assistance.   PLAN: 1. Follow up with behavioral health clinician on : Schedule follow up appointment 2. Behavioral recommendations: LCSW encourages pt to utilize healthy coping skills discussed in session, follow up with Legal Aid, and comply with medication management.  3. Referral(s): [place] (In Clinic) and [place] (LME/Outside Clinic) 4. "From scale of 1-10, how likely are you to follow plan?": 7  Rebekah Chesterfield, LCSW 06/03/2019 2:25 PM

## 2019-06-17 ENCOUNTER — Telehealth: Payer: Self-pay | Admitting: Licensed Clinical Social Worker

## 2019-06-17 NOTE — Telephone Encounter (Signed)
LCSW followed up with Jeffrey Murphy regarding status of Legal Aid referral. LCSW was informed that pt would need to obtain a Non-Status Filing letter found on the IRS website. Pt is encouraged to do so before October.

## 2019-07-24 ENCOUNTER — Encounter: Payer: Self-pay | Admitting: Family Medicine

## 2019-07-24 ENCOUNTER — Other Ambulatory Visit: Payer: Self-pay

## 2019-07-24 ENCOUNTER — Ambulatory Visit: Payer: BLUE CROSS/BLUE SHIELD | Attending: Family Medicine | Admitting: Family Medicine

## 2019-07-24 DIAGNOSIS — E1165 Type 2 diabetes mellitus with hyperglycemia: Secondary | ICD-10-CM

## 2019-07-24 DIAGNOSIS — R600 Localized edema: Secondary | ICD-10-CM | POA: Diagnosis not present

## 2019-07-24 DIAGNOSIS — I10 Essential (primary) hypertension: Secondary | ICD-10-CM | POA: Diagnosis not present

## 2019-07-24 MED ORDER — NOVOLIN 70/30 (70-30) 100 UNIT/ML ~~LOC~~ SUSP: 25.0000 [IU] | Freq: Two times a day (BID) | SUBCUTANEOUS | 6 refills | Status: DC

## 2019-07-24 MED ORDER — FUROSEMIDE 20 MG PO TABS: 20.0000 mg | ORAL_TABLET | Freq: Every day | ORAL | 3 refills | Status: DC

## 2019-07-24 MED ORDER — LISINOPRIL 20 MG PO TABS: 20.0000 mg | ORAL_TABLET | Freq: Every day | ORAL | 0 refills | Status: DC

## 2019-07-24 NOTE — Progress Notes (Signed)
Virtual Visit via Telephone Note  I connected with Jeffrey Murphy on 07/24/19 at  2:10 PM EDT by telephone and verified that I am speaking with the correct person using two identifiers.   I discussed the limitations, risks, security and privacy concerns of performing an evaluation and management service by telephone and the availability of in person appointments. I also discussed with the patient that there may be a patient responsible charge related to this service. The patient expressed understanding and agreed to proceed.  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  He reports that he is no longer using illicit drugs as he really likes his current job and wishes to keep it.  He has not picked up a glucometer from the pharmacy but believes that his blood sugars are better controlled as he is having less thirst and less increase in urinary frequency.  He continues to have some issues with swelling but no pain in his lower legs.  He does need a refill of Lasix.  He also requests refill of blood pressure medication.  He denies any headaches or dizziness related to his blood pressure.  Overall he states that he is feeling much better.  He is currently working third shift and he states that since he is working and does not have time to do things that would get him in trouble, he is sleeping better, eating better and has less anxiety as he is now able to catch up on his bills.  He denies any issues with chest pain or palpitations, no shortness of breath or cough, no abdominal pain-no nausea/vomiting/diarrhea or constipation.  Past Medical History:  Diagnosis Date  .  Abscess 12/2016   LEFT INGUINAL ABSCESS  . Arthritis   . Diabetes mellitus without complication (HCC)   . Hypertension   . Wears glasses    reading    Past Surgical History:  Procedure Laterality Date  . COLONOSCOPY    . CYST REMOVAL NECK    . INGUINAL HERNIA REPAIR     right  . IRRIGATION AND DEBRIDEMENT ABSCESS Left 12/12/2016   Procedure: IRRIGATION AND DEBRIDEMENT ABSCESS;  Surgeon: Almond LintFaera Byerly, MD;  Location: [facility];  Service: General;  Laterality: Left;  . JOINT REPLACEMENT     right total knee  . PILONIDAL CYST EXCISION     back  . QUADRICEPS TENDON REPAIR Right 12/24/2014   Procedure: RIGHT QUADRICEP TENDON REPAIR ;  Surgeon: Cheral AlmasNaiping Michael Xu, MD;  Location: [facility];  Service: Orthopedics;  Laterality: Right;  . REPLACEMENT TOTAL KNEE      Family History  Problem Relation Age of Onset  . Cancer Mother   . Hypertension Mother   . Diabetes Father     Social History   Tobacco Use  . Smoking status: Never Smoker  . Smokeless tobacco: Never Used  Substance Use Topics  . Alcohol use: Not Currently    Comment: drinks every other day  . Drug use: Yes    Frequency: 5.0 times per week    Types: Marijuana, Cocaine, "Crack" cocaine    Comment: last use months  No Known Allergies     Observations/Objective: No vital signs or physical exam conducted as visit was done via telephone  Assessment and Plan: 1. Uncontrolled type 2 diabetes mellitus with hyperglycemia ([place]) Patient is encouraged to obtain a glucometer to start checking his blood sugars.  He states that he would like to check and see if it will be cheaper for him to obtain an over-the-counter monitor strips from [employer] versus the contour monitoring strips which his insurance will cover.  He is currently taking NPH 25 units twice daily and new prescription is sent into pharmacy. - insulin NPH-regular Human (NOVOLIN 70/30) (70-30) 100 UNIT/ML injection; Inject 25 Units into the skin 2 (two)  times daily with a meal.  Dispense: 10 mL; Refill: 6  2. Essential hypertension; 3.  Bilateral lower extremity edema New prescription sent in for Zestril 20 mg and Lasix 20 mg to help with blood pressure and peripheral edema.  Patient is also encouraged to try the use of over-the-counter compression stockings to help with the swelling in his legs.  Patient is encouraged to periodically check his blood pressure and follow a low-sodium diet. - furosemide (LASIX) 20 MG tablet; Take 1 tablet (20 mg total) by mouth daily. If needed for leg swelling  Dispense: 30 tablet; Refill: 3 - lisinopril (ZESTRIL) 20 MG tablet; Take 1 tablet (20 mg total) by mouth daily. To lower blood pressure  Dispense: 90 tablet; Refill: 0  -Patient was encouraged to call and schedule nurse visit for influenza immunization.  Patient will have blood work at his next visit in follow-up of his diabetes and chronic issues.  Follow Up Instructions:Return in about 2 months (around 09/23/2019) for DM/HT in 2-3 months.    I discussed the assessment and treatment plan with the patient. The patient was provided an opportunity to ask questions and all were answered. The patient agreed with the plan and demonstrated an understanding of the instructions.   The patient was advised to call back or seek an in-person evaluation if the symptoms worsen or if the condition fails to improve as anticipated.  I provided 11 minutes of non-face-to-face time during this encounter.   Antony Blackbird, MD

## 2019-09-26 ENCOUNTER — Ambulatory Visit: Payer: BLUE CROSS/BLUE SHIELD

## 2019-10-08 ENCOUNTER — Other Ambulatory Visit: Payer: Self-pay | Admitting: Family Medicine

## 2019-10-08 DIAGNOSIS — E1165 Type 2 diabetes mellitus with hyperglycemia: Secondary | ICD-10-CM

## 2019-10-08 MED FILL — LISINOPRIL 20 MG TABLET: 20 | 30 days supply | Qty: 30 | Fill #0

## 2019-10-08 MED FILL — FUROSEMIDE 20 MG TABS: 20 | 30 days supply | Qty: 30 | Fill #0

## 2019-10-08 MED FILL — glipiZIDE 10 MG TABS: 10 | 30 days supply | Qty: 60 | Fill #0

## 2019-10-09 MED FILL — NOVOLIN 70/30 100 UNITS/ML: (70-30) 100 | 20 days supply | Qty: 10 | Fill #0

## 2019-10-24 MED FILL — NOVOLIN 70/30 100 UNITS/ML: (70-30) 100 | 20 days supply | Qty: 10 | Fill #0

## 2019-10-24 MED FILL — glipiZIDE 10 MG TABS: 10 | 30 days supply | Qty: 60 | Fill #0

## 2019-10-25 ENCOUNTER — Ambulatory Visit
Admission: EM | Admit: 2019-10-25 | Discharge: 2019-10-25 | Disposition: A | Discharge status: Home / Self Care | Payer: Self-pay | Attending: Physician Assistant | Admitting: Physician Assistant

## 2019-10-25 ENCOUNTER — Other Ambulatory Visit: Payer: Self-pay

## 2019-10-25 DIAGNOSIS — Z202 Contact with and (suspected) exposure to infections with a predominantly sexual mode of transmission: Secondary | ICD-10-CM | POA: Insufficient documentation

## 2019-10-25 DIAGNOSIS — K121 Other forms of stomatitis: Secondary | ICD-10-CM | POA: Insufficient documentation

## 2019-10-25 DIAGNOSIS — J020 Streptococcal pharyngitis: Secondary | ICD-10-CM | POA: Insufficient documentation

## 2019-10-25 LAB — POCT RAPID STREP A (OFFICE): Rapid Strep A Screen: POSITIVE — AB

## 2019-10-25 MED ORDER — PENICILLIN G BENZATHINE 1200000 UNIT/2ML IM SUSP
1.2000 10*6.[IU] | Freq: Once | INTRAMUSCULAR | Status: AC
  Administered 2019-10-25: 1.2 10*6.[IU] via INTRAMUSCULAR

## 2019-10-25 NOTE — Discharge Instructions (Signed)
See Dr. Janace Hoard for evaluation.  The lesion on your tongue needs evaluation

## 2019-10-25 NOTE — ED Triage Notes (Signed)
Pt c/o sore throat and sore tongue, painful to swallow x3 days. States had oral sex 3 days prior to that with a different women then his wife.

## 2019-10-25 NOTE — ED Provider Notes (Signed)
[facility]    CSN: [id number] Arrival date & time: 10/25/19  1337      History   Chief Complaint Chief Complaint  Patient presents with  . Sore Throat    HPI Jeffrey Murphy is a 63 y.o. male.   The history is provided by the patient. No language interpreter was used.  Sore Throat This is a new problem. The current episode started more than 2 days ago. The problem occurs constantly. The problem has been gradually worsening. Nothing aggravates the symptoms. Nothing relieves the symptoms. He has tried nothing for the symptoms. The treatment provided no relief.  Pt complains of a sore throat and a sore on his tongue.  Pt is worried that he has an oral std.  Pt concerned about syphilis.    Past Medical History:  Diagnosis Date  . Abscess 12/2016   LEFT INGUINAL ABSCESS  . Arthritis   . Diabetes mellitus without complication ([place])   . Hypertension   . Wears glasses    reading    Patient Active Problem List   Diagnosis Date Noted  . Inguinal abscess 12/12/2016  . Diabetes mellitus with complication ([place]) 17/51/0258  . Sepsis ([place]) 12/12/2016  . Drug abuse ([place])   . Hyperglycemia   . GERD (gastroesophageal reflux disease) 04/19/2016  . Hypertension 04/19/2016  . Anxiety 04/19/2016  . Rupture of right quadriceps tendon 12/24/2014  . Quadriceps tendon rupture 12/24/2014    Past Surgical History:  Procedure Laterality Date  . COLONOSCOPY    . CYST REMOVAL NECK    . INGUINAL HERNIA REPAIR     right  . IRRIGATION AND DEBRIDEMENT ABSCESS Left 12/12/2016   Procedure: IRRIGATION AND DEBRIDEMENT ABSCESS;  Surgeon: Stark Klein, MD;  Location: [facility];  Service: General;  Laterality: Left;  . JOINT REPLACEMENT     right total knee  . PILONIDAL CYST EXCISION     back  . QUADRICEPS TENDON REPAIR Right 12/24/2014   Procedure: RIGHT QUADRICEP TENDON REPAIR ;  Surgeon: Marianna Payment, MD;  Location: [facility];  Service: Orthopedics;  Laterality:  Right;  . REPLACEMENT TOTAL KNEE         Home Medications    Prior to Admission medications   Medication Sig Start Date End Date Taking? Authorizing Provider  amoxicillin (AMOXIL) 875 MG tablet Take 1 tablet (875 mg total) by mouth 2 (two) times daily. Take after eating Patient not taking: Reported on 07/24/2019 04/15/19   Antony Blackbird, MD  blood glucose meter kit and supplies KIT Dispense based on patient and insurance preference. Use up to four times daily as directed. (FOR ICD-9 250.00, 250.01). 12/17/16   Rai, Ripudeep K, MD  Blood Glucose Monitoring Suppl (CONTOUR NEXT MONITOR) w/Device KIT 1 each by Does not apply route 3 (three) times daily. 04/15/19   Fulp, Cammie, MD  furosemide (LASIX) 20 MG tablet Take 1 tablet (20 mg total) by mouth daily. If needed for leg swelling 07/24/19   Fulp, Cammie, MD  glipiZIDE (GLUCOTROL) 10 MG tablet Take 1 tablet (10 mg total) by mouth 2 (two) times daily before a meal. To lower blood sugars 10/08/19   Fulp, Cammie, MD  glucose blood (CONTOUR NEXT TEST) test strip Use as instructed 04/15/19   Fulp, Cammie, MD  insulin NPH-regular Human (NOVOLIN 70/30) (70-30) 100 UNIT/ML injection Inject 25 Units into the skin 2 (two) times daily with a meal. 07/24/19   Fulp, Cammie, MD  Insulin Pen Needle (TRUEPLUS PEN  NEEDLES) 32G X 4 MM MISC Use to inject insulin daily. 05/29/19   Fulp, Cammie, MD  Insulin Syringes, Disposable, U-100 0.5 ML MISC Use as directed for insulin Patient not taking: Reported on 05/29/2019 12/13/18   Antony Blackbird, MD  Lancet Devices (MICROLET NEXT LANCING DEVICE) MISC 1 each by Does not apply route 3 (three) times daily. Patient not taking: Reported on 05/29/2019 04/15/19   Fulp, Cammie, MD  lisinopril (ZESTRIL) 20 MG tablet Take 1 tablet (20 mg total) by mouth daily. To lower blood pressure 07/24/19   Fulp, Cammie, MD  Microlet Lancets MISC 1 each by Subdermal route 3 (three) times daily. 04/15/19   Fulp, Cammie, MD  tamsulosin (FLOMAX) 0.4 MG CAPS capsule  Take 1 capsule (0.4 mg total) by mouth daily. Patient not taking: Reported on 05/29/2019 12/13/18   Antony Blackbird, MD  traZODone (DESYREL) 50 MG tablet Take 1 tablet (50 mg total) by mouth at bedtime as needed for sleep. Patient not taking: Reported on 05/29/2019 04/15/19   Antony Blackbird, MD    Family History Family History  Problem Relation Age of Onset  . Cancer Mother   . Hypertension Mother   . Diabetes Father     Social History Social History   Tobacco Use  . Smoking status: Never Smoker  . Smokeless tobacco: Never Used  Substance Use Topics  . Alcohol use: Not Currently    Comment: drinks every other day  . Drug use: Yes    Frequency: 5.0 times per week    Types: Marijuana, Cocaine, "Crack" cocaine    Comment: last use months     Allergies   Patient has no known allergies.   Review of Systems Review of Systems  All other systems reviewed and are negative.    Physical Exam Triage Vital Signs ED Triage Vitals  Enc Vitals Group     BP 10/25/19 1427 (!) 142/90     Pulse Rate 10/25/19 1427 86     Resp 10/25/19 1427 18     Temp 10/25/19 1427 98.3 F (36.8 C)     Temp Source 10/25/19 1427 Oral     SpO2 10/25/19 1427 91 %     Weight --      Height --      Head Circumference --      Peak Flow --      Pain Score 10/25/19 1428 9     Pain Loc --      Pain Edu? --      Excl. in [place]? --    No data found.  Updated Vital Signs BP (!) 142/90 (BP Location: Left Arm)   Pulse 86   Temp 98.3 F (36.8 C) (Oral)   Resp 18   SpO2 91%   Visual Acuity Right Eye Distance:   Left Eye Distance:   Bilateral Distance:    Right Eye Near:   Left Eye Near:    Bilateral Near:     Physical Exam Vitals and nursing note reviewed.  Constitutional:      Appearance: He is well-developed.  HENT:     Head: Normocephalic and atraumatic.     Mouth/Throat:     Tonsils: 1+ on the right. 1+ on the left.     Comments: 1cm lesion right side of tongue.   Eyes:      Conjunctiva/sclera: Conjunctivae normal.  Cardiovascular:     Rate and Rhythm: Normal rate and regular rhythm.     Heart sounds: No murmur.  Pulmonary:     Effort: Pulmonary effort is normal. No respiratory distress.     Breath sounds: Normal breath sounds.  Abdominal:     Palpations: Abdomen is soft.     Tenderness: There is no abdominal tenderness.  Musculoskeletal:     Cervical back: Neck supple.  Skin:    General: Skin is warm and dry.  Neurological:     Mental Status: He is alert.      UC Treatments / Results  Labs (all labs ordered are listed, but only abnormal results are displayed) Labs Reviewed  POCT RAPID STREP A (OFFICE) - Abnormal; Notable for the following components:      Result Value   Rapid Strep A Screen Positive (*)    All other components within normal limits  HIV ANTIBODY (ROUTINE TESTING W REFLEX)  RPR  GC/CHLAMYDIA PROBE AMP ([place]) NOT AT [facility]    EKG   Radiology No results found.  Procedures Procedures (including critical care time)  Medications Ordered in UC Medications  penicillin g benzathine (BICILLIN LA) 1200000 UNIT/2ML injection 1.2 Million Units (1.2 Million Units Intramuscular Given 10/25/19 1514)    Initial Impression / Assessment and Plan / UC Course  I have reviewed the triage vital signs and the nursing notes.  Pertinent labs & imaging results that were available during my care of the patient were reviewed by me and considered in my medical decision making (see chart for details).     MDM  STD test ordered. Strep is positive.  Pt given Bicillian IM.  Pt advised I do not think sore on his tongue is related to strep.  I am concerned lesion may be oral cancer.  Pt advised.  He is advised to schedule to see ENT if area persist.   Final Clinical Impressions(s) / UC Diagnoses   Final diagnoses:  Oral ulcer  STD exposure  Streptococcal sore throat     Discharge Instructions     See Dr. Janace Hoard for evaluation.  The  lesion on your tongue needs evaluation    ED Prescriptions    None     PDMP not reviewed this encounter.   Fransico Meadow, [place] 10/25/19 1542

## 2019-10-26 LAB — HIV ANTIBODY (ROUTINE TESTING W REFLEX): HIV Screen 4th Generation wRfx: NONREACTIVE

## 2019-10-26 LAB — RPR: RPR Ser Ql: NONREACTIVE

## 2019-10-28 LAB — GC/CHLAMYDIA PROBE AMP (~~LOC~~) NOT AT ARMC
Chlamydia: NEGATIVE
Neisseria Gonorrhea: NEGATIVE

## 2020-01-14 MED FILL — FUROSEMIDE 20 MG TABS: 20 | 30 days supply | Qty: 30 | Fill #1

## 2020-01-14 MED FILL — LISINOPRIL 20 MG TABLET: 20 | 30 days supply | Qty: 30 | Fill #1

## 2020-04-29 ENCOUNTER — Other Ambulatory Visit: Payer: Self-pay | Admitting: Family Medicine

## 2020-04-29 DIAGNOSIS — E1165 Type 2 diabetes mellitus with hyperglycemia: Secondary | ICD-10-CM

## 2020-04-29 MED FILL — FUROSEMIDE 20 MG TABS: 20 | 30 days supply | Qty: 30 | Fill #2

## 2020-04-29 MED FILL — NOVOLIN 70/30 100 UNITS/ML: (70-30) 100 | 20 days supply | Qty: 10 | Fill #1

## 2020-04-29 MED FILL — LISINOPRIL 20 MG TABLET: 20 | 30 days supply | Qty: 30 | Fill #2

## 2020-06-02 DIAGNOSIS — Z79899 Other long term (current) drug therapy: Secondary | ICD-10-CM | POA: Diagnosis not present

## 2020-06-02 DIAGNOSIS — E119 Type 2 diabetes mellitus without complications: Secondary | ICD-10-CM | POA: Diagnosis not present

## 2020-06-02 DIAGNOSIS — Z1159 Encounter for screening for other viral diseases: Secondary | ICD-10-CM | POA: Diagnosis not present

## 2020-06-02 DIAGNOSIS — I10 Essential (primary) hypertension: Secondary | ICD-10-CM | POA: Diagnosis not present

## 2020-06-02 DIAGNOSIS — K047 Periapical abscess without sinus: Secondary | ICD-10-CM | POA: Diagnosis not present

## 2020-06-30 DIAGNOSIS — Z0001 Encounter for general adult medical examination with abnormal findings: Secondary | ICD-10-CM | POA: Diagnosis not present

## 2020-09-21 DIAGNOSIS — E1165 Type 2 diabetes mellitus with hyperglycemia: Secondary | ICD-10-CM | POA: Diagnosis not present

## 2020-09-21 DIAGNOSIS — I10 Essential (primary) hypertension: Secondary | ICD-10-CM | POA: Diagnosis not present

## 2020-09-21 DIAGNOSIS — K1379 Other lesions of oral mucosa: Secondary | ICD-10-CM | POA: Diagnosis not present

## 2022-03-16 ENCOUNTER — Ambulatory Visit (INDEPENDENT_AMBULATORY_CARE_PROVIDER_SITE_OTHER): Payer: Medicare PPO

## 2022-03-16 ENCOUNTER — Ambulatory Visit: Payer: Medicare PPO | Admitting: Podiatry

## 2022-03-16 DIAGNOSIS — L97511 Non-pressure chronic ulcer of other part of right foot limited to breakdown of skin: Secondary | ICD-10-CM

## 2022-03-16 DIAGNOSIS — B351 Tinea unguium: Secondary | ICD-10-CM

## 2022-03-16 DIAGNOSIS — M79675 Pain in left toe(s): Secondary | ICD-10-CM | POA: Diagnosis not present

## 2022-03-16 DIAGNOSIS — L97512 Non-pressure chronic ulcer of other part of right foot with fat layer exposed: Secondary | ICD-10-CM

## 2022-03-16 DIAGNOSIS — M79674 Pain in right toe(s): Secondary | ICD-10-CM

## 2022-03-16 DIAGNOSIS — E118 Type 2 diabetes mellitus with unspecified complications: Secondary | ICD-10-CM | POA: Diagnosis not present

## 2022-03-16 MED ORDER — DOXYCYCLINE HYCLATE 100 MG PO TABS: 100.0000 mg | ORAL_TABLET | Freq: Two times a day (BID) | ORAL | 0 refills | Status: DC

## 2022-03-16 MED ORDER — GENTAMICIN SULFATE 0.1 % EX CREA: 1.0000 "application " | TOPICAL_CREAM | Freq: Two times a day (BID) | CUTANEOUS | 1 refills | Status: DC

## 2022-03-16 NOTE — Progress Notes (Signed)
? ?  Subjective:  ?66 y.o. male with PMHx of diabetes mellitus presenting today for evaluation pain associated to the right forefoot.  Patient states that he noticed blood on his socks about 3-4 days ago.  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  Patient states that he does have history of uncontrolled diabetes mellitus.  He states that his last A1c was 12.5 on 03/14/2022.  Presenting today for further treatment and evaluation ? ? ?Past Medical History:  ?Diagnosis Date  ? Abscess 12/2016  ? LEFT INGUINAL ABSCESS  ? Arthritis   ? Diabetes mellitus without complication (HCC)   ? Hypertension   ? Wears glasses   ? reading  ? ? ?  ?Objective/Physical Exam ?General: The patient is alert and oriented x3 in no acute distress. ? ?Dermatology:  ?Wound #1 noted to the plantar aspect of the right forefoot measuring approximately 3.0 x 2.0 x 0.2 cm (LxWxD).  ? ?To the noted ulceration(s), there is no eschar. There is a moderate amount of slough, fibrin, and necrotic tissue noted. Granulation tissue and wound base is red. There is a minimal amount of serosanguineous drainage noted. There is no exposed bone muscle-tendon ligament or joint. There is no malodor. Periwound integrity is intact.  Hyperkeratotic dystrophic elongated discolored nails also noted 1-5 bilateral ?Skin is warm, dry and supple bilateral lower extremities. ? ?Vascular: Palpable pedal pulses bilaterally. No edema or erythema noted. Capillary refill within normal limits.  Clinically there is no concern for vascular compromise ? ?Neurological: Epicritic and protective threshold diminished bilaterally.  ? ?Musculoskeletal Exam: Hammertoe contracture noted which is likely causes increased pressure to the forefoot and ball of the foot ? ?Radiographic exam: Hammertoe contracture noted with degenerative changes noted throughout the foot.  No cortical erosion which would be concerning for osteomyelitis. ? ?Assessment: ?1.  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 diabetes mellitus w/ peripheral neuropathy ?3.  Hammertoe contractures lesser digits bilateral ?4.  Pain due to onychomycosis of toenails both ? ? ?Plan of Care:  ?1. Patient was evaluated.  X-rays reviewed.  Mechanical debridement of the nails 1-5 bilateral was performed using a nail nipper without incident or bleeding and the patient felt relief ?2. medically necessary excisional debridement including subcutaneous tissue was performed using a tissue nipper and a chisel blade. Excisional debridement of all the necrotic nonviable tissue down to healthy bleeding viable tissue was performed with post-debridement measurements same as pre-. ?3. the wound was cleansed and dry sterile dressing applied. ?4.  Cultures taken and sent to pathology for culture and sensitivity  ?5.  Prescription for doxycycline 100 mg 2 times daily #20  ?6.  Prescription for gentamicin cream applied 2 times daily  ?7.  Postsurgical shoe dispensed.  Wear daily  ?8.  Patient is to return to clinic in 2 weeks. ? ? ?[name], DPM ?[facility] ? ?Dr. [name], DPM  ?  ?[address]                                        ?[place], [place] [address]                ?Office [phone]  ?Fax [phone] ? ? ? ? ?

## 2022-03-20 LAB — WOUND CULTURE
MICRO NUMBER:: 13377598
SPECIMEN QUALITY:: ADEQUATE

## 2022-03-30 ENCOUNTER — Ambulatory Visit (INDEPENDENT_AMBULATORY_CARE_PROVIDER_SITE_OTHER): Payer: Medicare PPO | Admitting: Podiatry

## 2022-03-30 DIAGNOSIS — L97512 Non-pressure chronic ulcer of other part of right foot with fat layer exposed: Secondary | ICD-10-CM

## 2022-03-30 DIAGNOSIS — E118 Type 2 diabetes mellitus with unspecified complications: Secondary | ICD-10-CM | POA: Diagnosis not present

## 2022-03-30 NOTE — Progress Notes (Signed)
   Subjective:  66 y.o. male with PMHx of diabetes mellitus presenting today for follow-up evaluation of an ulcer to the right forefoot.  Patient states that he is doing much better.  The pain has resolved.  He took the oral antibiotics as prescribed.  No new complaints at this time   Past Medical History:  Diagnosis Date   Abscess 12/2016   LEFT INGUINAL ABSCESS   Arthritis    Diabetes mellitus without complication (HCC)    Hypertension    Wears glasses    reading   Past Surgical History:  Procedure Laterality Date   COLONOSCOPY     CYST REMOVAL NECK     INGUINAL HERNIA REPAIR     right   IRRIGATION AND DEBRIDEMENT ABSCESS Left 12/12/2016   Procedure: IRRIGATION AND DEBRIDEMENT ABSCESS;  Surgeon: Almond Lint, MD;  Location: [facility];  Service: General;  Laterality: Left;   JOINT REPLACEMENT     right total knee   PILONIDAL CYST EXCISION     back   QUADRICEPS TENDON REPAIR Right 12/24/2014   Procedure: RIGHT QUADRICEP TENDON REPAIR ;  Surgeon: Cheral Almas, MD;  Location: [facility];  Service: Orthopedics;  Laterality: Right;   REPLACEMENT TOTAL KNEE     No Known Allergies    Objective/Physical Exam General: The patient is alert and oriented x3 in no acute distress.  Dermatology:  Wound #1 noted to the plantar aspect of the right forefoot has healed.  Complete reepithelialization has occurred.  There is some periwound callus which after debridement demonstrated healthy underlying skin. Skin is warm, dry and supple bilateral lower extremities.  Vascular: Palpable pedal pulses bilaterally. No edema or erythema noted. Capillary refill within normal limits.  Clinically there is no concern for vascular compromise  Neurological: Epicritic and protective threshold diminished bilaterally.   Musculoskeletal Exam: Hammertoe contracture noted which is likely causes increased pressure to the forefoot and ball of the foot  Radiographic exam 03/16/2022 RT foot:  Hammertoe contracture noted with degenerative changes noted throughout the foot.  No cortical erosion which would be concerning for osteomyelitis.  Assessment: 1.  Ulcer right forefoot secondary to diabetes mellitus 2. diabetes mellitus w/ peripheral neuropathy 3.  Hammertoe contractures lesser digits bilateral   Plan of Care:  1. Patient was evaluated.  Comprehensive diabetic foot exam performed today 2.  Light debridement of the freshly healed wound was performed today to remove some of the callus tissue using a 312 scalpel without incident or bleeding. 3.  Recommend good supportive shoes and sneakers.  Advised against going barefoot 4.  Stressed the importance of maintaining good foot hygiene and working closely with his PCP for better management of his diabetes 5.  Return to clinic annually  [name], DPM [facility]  Dr. [name], DPM    [address] [place], [place] [address]                Office [phone]  Fax [phone]

## 2022-06-23 ENCOUNTER — Other Ambulatory Visit: Payer: Self-pay

## 2022-06-23 ENCOUNTER — Emergency Department (HOSPITAL_COMMUNITY)
Admission: EM | Admit: 2022-06-23 | Discharge: 2022-06-24 | Disposition: A | Discharge status: Home / Self Care | Payer: Medicare PPO | Attending: Emergency Medicine | Admitting: Emergency Medicine

## 2022-06-23 ENCOUNTER — Encounter (HOSPITAL_COMMUNITY): Payer: Self-pay | Admitting: Emergency Medicine

## 2022-06-23 DIAGNOSIS — Z5321 Procedure and treatment not carried out due to patient leaving prior to being seen by health care provider: Secondary | ICD-10-CM | POA: Insufficient documentation

## 2022-06-23 DIAGNOSIS — L02811 Cutaneous abscess of head [any part, except face]: Secondary | ICD-10-CM | POA: Diagnosis present

## 2022-06-23 MED ORDER — ACETAMINOPHEN 325 MG PO TABS
650.0000 mg | ORAL_TABLET | Freq: Once | ORAL | Status: AC
  Administered 2022-06-23: 650 mg via ORAL

## 2022-06-23 NOTE — ED Triage Notes (Signed)
Patient reports skin abscess at occipital scalp onset this week with swelling and drainage .

## 2022-06-24 NOTE — ED Notes (Signed)
PT did not answer call for vital at 09:32.

## 2022-06-24 NOTE — ED Notes (Signed)
PT was called for vitals  at 09:47 and did not answer second call

## 2022-06-24 NOTE — ED Notes (Signed)
PT was called again to see if he was present, 09:59 no answer. PT has left AMA.

## 2022-06-26 ENCOUNTER — Encounter (HOSPITAL_COMMUNITY): Payer: Self-pay | Admitting: Emergency Medicine

## 2022-06-26 ENCOUNTER — Ambulatory Visit (HOSPITAL_COMMUNITY)
Admission: EM | Admit: 2022-06-26 | Discharge: 2022-06-26 | Disposition: A | Discharge status: Home / Self Care | Payer: Medicare PPO

## 2022-06-26 DIAGNOSIS — L0211 Cutaneous abscess of neck: Secondary | ICD-10-CM | POA: Diagnosis not present

## 2022-06-26 DIAGNOSIS — E11628 Type 2 diabetes mellitus with other skin complications: Secondary | ICD-10-CM

## 2022-06-26 DIAGNOSIS — M542 Cervicalgia: Secondary | ICD-10-CM | POA: Diagnosis not present

## 2022-06-26 NOTE — ED Triage Notes (Signed)
Pt has rash on posterior neck for about 4 days that is painful. Reports there is about 3-4 bumps. Reports burns and stings and gotten larger.

## 2022-06-26 NOTE — ED Provider Notes (Signed)
[place]    CSN: [id number] Arrival date & time: 06/26/22  1007      History   Chief Complaint Chief Complaint  Patient presents with   Rash    HPI Jeffrey Murphy is a 66 y.o. male.   66 year old male presents with abscess on his posterior neck.  Patient relates for the past 4 days he has been having an enlarging abscess on the posterior side of his neck, indicates there is a lot of pain, tenderness present.  Patient relates that it is increased in size over the past several days to where he cannot hardly raise his head up, turn his head to either side, or look up.  Patient relates the area has been draining a little bit, he has not had any fever or chills.  Patient indicates he has taken some Tylenol for pain relief but this does not seem to work.  Patient is a diabetic and is currently taking Mounjaro to try to achieve glucose control.   Rash   Past Medical History:  Diagnosis Date   Abscess 12/2016   LEFT INGUINAL ABSCESS   Arthritis    Diabetes mellitus without complication ([place])    Hypertension    Wears glasses    reading    Patient Active Problem List   Diagnosis Date Noted   Inguinal abscess 12/12/2016   Diabetes mellitus with complication ([place]) 27/74/1287   Sepsis ([place]) 12/12/2016   Drug abuse ([place])    Hyperglycemia    GERD (gastroesophageal reflux disease) 04/19/2016   Hypertension 04/19/2016   Anxiety 04/19/2016   Rupture of right quadriceps tendon 12/24/2014   Quadriceps tendon rupture 12/24/2014    Past Surgical History:  Procedure Laterality Date   COLONOSCOPY     CYST REMOVAL NECK     INGUINAL HERNIA REPAIR     right   IRRIGATION AND DEBRIDEMENT ABSCESS Left 12/12/2016   Procedure: IRRIGATION AND DEBRIDEMENT ABSCESS;  Surgeon: Stark Klein, MD;  Location: [facility];  Service: General;  Laterality: Left;   JOINT REPLACEMENT     right total knee   PILONIDAL CYST EXCISION     back   QUADRICEPS TENDON REPAIR Right 12/24/2014   Procedure:  RIGHT QUADRICEP TENDON REPAIR ;  Surgeon: Marianna Payment, MD;  Location: [facility];  Service: Orthopedics;  Laterality: Right;   REPLACEMENT TOTAL KNEE         Home Medications    Prior to Admission medications   Medication Sig Start Date End Date Taking? Authorizing Provider  amoxicillin (AMOXIL) 875 MG tablet Take 1 tablet (875 mg total) by mouth 2 (two) times daily. Take after eating Patient not taking: Reported on 07/24/2019 04/15/19   Fulp, Ander Gaster, MD  atorvastatin (LIPITOR) 40 MG tablet Take 40 mg by mouth at bedtime. 02/02/22   [provider]  blood glucose meter kit and supplies KIT Dispense based on patient and insurance preference. Use up to four times daily as directed. (FOR ICD-9 250.00, 250.01). 12/17/16   Rai, Ripudeep K, MD  Blood Glucose Monitoring Suppl (CONTOUR NEXT MONITOR) w/Device KIT 1 each by Does not apply route 3 (three) times daily. 04/15/19   Fulp, Cammie, MD  doxycycline (VIBRA-TABS) 100 MG tablet Take 1 tablet (100 mg total) by mouth 2 (two) times daily. 03/16/22   Edrick Kins, DPM  furosemide (LASIX) 20 MG tablet Take 1 tablet (20 mg total) by mouth daily. If needed for leg swelling 07/24/19   Antony Blackbird, MD  gabapentin (NEURONTIN) 300 MG capsule Take by mouth. 03/07/22   [provider]  gentamicin cream (GARAMYCIN) 0.1 % Apply 1 application. topically 2 (two) times daily. 03/16/22   [name], DPM  glipiZIDE (GLUCOTROL) 10 MG tablet Take 1 tablet (10 mg total) by mouth 2 (two) times daily before a meal. To lower blood sugars 10/08/19   Fulp, Cammie, MD  glucose blood (CONTOUR NEXT TEST) test strip Use as instructed 04/15/19   Fulp, Cammie, MD  insulin NPH-regular Human (NOVOLIN 70/30) (70-30) 100 UNIT/ML injection Inject 25 Units into the skin 2 (two) times daily with a meal. 07/24/19   Fulp, Cammie, MD  Insulin Pen Needle (TRUEPLUS PEN NEEDLES) 32G X 4 MM MISC Use to inject insulin daily. 05/29/19   Fulp, Cammie, MD  Insulin  Syringes, Disposable, U-100 0.5 ML MISC Use as directed for insulin Patient not taking: Reported on 05/29/2019 12/13/18   Cain Saupe, MD  Lancet Devices (MICROLET NEXT LANCING DEVICE) MISC 1 each by Does not apply route 3 (three) times daily. Patient not taking: Reported on 05/29/2019 04/15/19   Fulp, Cammie, MD  lisinopril (ZESTRIL) 20 MG tablet Take 1 tablet (20 mg total) by mouth daily. To lower blood pressure 07/24/19   Fulp, Cammie, MD  lisinopril (ZESTRIL) 30 MG tablet Take 30 mg by mouth daily. 02/02/22   [provider]  Microlet Lancets MISC 1 each by Subdermal route 3 (three) times daily. 04/15/19   Fulp, Cammie, MD  MOUNJARO 5 MG/0.5ML Pen Inject into the skin. 03/07/22   [provider]  tamsulosin (FLOMAX) 0.4 MG CAPS capsule Take 1 capsule (0.4 mg total) by mouth daily. Patient not taking: Reported on 05/29/2019 12/13/18   Cain Saupe, MD  traZODone (DESYREL) 50 MG tablet Take 1 tablet (50 mg total) by mouth at bedtime as needed for sleep. Patient not taking: Reported on 05/29/2019 04/15/19   Cain Saupe, MD    Family History Family History  Problem Relation Age of Onset   Cancer Mother    Hypertension Mother    Diabetes Father     Social History Social History   Tobacco Use   Smoking status: Never   Smokeless tobacco: Never  Vaping Use   Vaping Use: Never used  Substance Use Topics   Alcohol use: Not Currently    Comment: drinks every other day   Drug use: Yes    Frequency: 5.0 times per week    Types: Marijuana, Cocaine, "Crack" cocaine    Comment: last use months     Allergies   Patient has no known allergies.   Review of Systems Review of Systems  Skin:  Positive for rash (abscess posterior neck).     Physical Exam Triage Vital Signs ED Triage Vitals  Enc Vitals Group     BP 06/26/22 1034 (!) 152/93     Pulse Rate 06/26/22 1034 (!) 130     Resp 06/26/22 1034 18     Temp 06/26/22 1034 98.2 F (36.8 C)     Temp Source 06/26/22 1034 Oral      SpO2 06/26/22 1034 94 %     Weight --      Height --      Head Circumference --      Peak Flow --      Pain Score 06/26/22 1032 6     Pain Loc --      Pain Edu? --      Excl. in GC? --  No data found.  Updated Vital Signs BP (!) 152/93 (BP Location: Left Arm)   Pulse (!) 130   Temp 98.2 F (36.8 C) (Oral)   Resp 18   SpO2 94%   Visual Acuity Right Eye Distance:   Left Eye Distance:   Bilateral Distance:    Right Eye Near:   Left Eye Near:    Bilateral Near:     Physical Exam Constitutional:      Appearance: Normal appearance.  Skin:         Comments: Posterior neck: There is a very large abscess present that measures 8 cm x 4 cm on the posterior side of the neck, it is solid there is no fluctuance present except a very little bit in the center.  There are multiple sinus tracts present on the abscess area.  Range of motion of the neck is limited with pain on rotation to the right, left, and when the patient tries to look up.  Neurological:     Mental Status: He is alert.      UC Treatments / Results  Labs (all labs ordered are listed, but only abnormal results are displayed) Labs Reviewed - No data to display  EKG   Radiology No results found.  Procedures Procedures (including critical care time)  Medications Ordered in UC Medications - No data to display  Initial Impression / Assessment and Plan / UC Course  I have reviewed the triage vital signs and the nursing notes.  Pertinent labs & imaging results that were available during my care of the patient were reviewed by me and considered in my medical decision making (see chart for details).    Plan: 1.  Patient has been referred to the emergency room for higher level of care due to the location and size of this abscess.  Patient is a diabetic which also complicates treatment. 2.  Return to urgent care as needed. Final Clinical Impressions(s) / UC Diagnoses   Final diagnoses:  Abscess of  skin of neck  Neck pain     Discharge Instructions      Advised patient to report to the emergency room due to the location, and size of the abscess with associated cellulitis at the occipital area of the neck.  (Size 8 cm x 4 cm)    ED Prescriptions   None    PDMP not reviewed this encounter.   Nyoka Lint, PA-C 06/26/22 1108

## 2022-06-26 NOTE — Discharge Instructions (Signed)
Advised patient to report to the emergency room due to the location, and size of the abscess with associated cellulitis at the occipital area of the neck.  (Size 8 cm x 4 cm)

## 2022-06-27 ENCOUNTER — Inpatient Hospital Stay (HOSPITAL_COMMUNITY)
Admission: EM | Admit: 2022-06-27 | Discharge: 2022-07-05 | DRG: 854 | Disposition: A | Discharge status: Home / Self Care | Payer: Medicare PPO | Attending: Internal Medicine | Admitting: Internal Medicine

## 2022-06-27 ENCOUNTER — Other Ambulatory Visit: Payer: Self-pay

## 2022-06-27 ENCOUNTER — Encounter (HOSPITAL_COMMUNITY): Payer: Self-pay

## 2022-06-27 ENCOUNTER — Emergency Department (HOSPITAL_COMMUNITY): Payer: Medicare PPO

## 2022-06-27 DIAGNOSIS — L0211 Cutaneous abscess of neck: Secondary | ICD-10-CM | POA: Diagnosis present

## 2022-06-27 DIAGNOSIS — L0213 Carbuncle of neck: Secondary | ICD-10-CM | POA: Diagnosis present

## 2022-06-27 DIAGNOSIS — Z8249 Family history of ischemic heart disease and other diseases of the circulatory system: Secondary | ICD-10-CM | POA: Diagnosis not present

## 2022-06-27 DIAGNOSIS — K219 Gastro-esophageal reflux disease without esophagitis: Secondary | ICD-10-CM | POA: Diagnosis present

## 2022-06-27 DIAGNOSIS — I10 Essential (primary) hypertension: Secondary | ICD-10-CM | POA: Diagnosis present

## 2022-06-27 DIAGNOSIS — L739 Follicular disorder, unspecified: Secondary | ICD-10-CM | POA: Diagnosis present

## 2022-06-27 DIAGNOSIS — F419 Anxiety disorder, unspecified: Secondary | ICD-10-CM | POA: Diagnosis present

## 2022-06-27 DIAGNOSIS — Z794 Long term (current) use of insulin: Secondary | ICD-10-CM | POA: Diagnosis not present

## 2022-06-27 DIAGNOSIS — F1410 Cocaine abuse, uncomplicated: Secondary | ICD-10-CM | POA: Diagnosis present

## 2022-06-27 DIAGNOSIS — E118 Type 2 diabetes mellitus with unspecified complications: Secondary | ICD-10-CM | POA: Diagnosis not present

## 2022-06-27 DIAGNOSIS — L03221 Cellulitis of neck: Secondary | ICD-10-CM | POA: Diagnosis present

## 2022-06-27 DIAGNOSIS — E871 Hypo-osmolality and hyponatremia: Secondary | ICD-10-CM | POA: Diagnosis present

## 2022-06-27 DIAGNOSIS — Z5900 Homelessness unspecified: Secondary | ICD-10-CM

## 2022-06-27 DIAGNOSIS — E1165 Type 2 diabetes mellitus with hyperglycemia: Secondary | ICD-10-CM | POA: Diagnosis present

## 2022-06-27 DIAGNOSIS — B9561 Methicillin susceptible Staphylococcus aureus infection as the cause of diseases classified elsewhere: Secondary | ICD-10-CM | POA: Diagnosis present

## 2022-06-27 DIAGNOSIS — Z79899 Other long term (current) drug therapy: Secondary | ICD-10-CM

## 2022-06-27 DIAGNOSIS — E875 Hyperkalemia: Secondary | ICD-10-CM | POA: Diagnosis present

## 2022-06-27 DIAGNOSIS — F1210 Cannabis abuse, uncomplicated: Secondary | ICD-10-CM | POA: Diagnosis present

## 2022-06-27 DIAGNOSIS — Z833 Family history of diabetes mellitus: Secondary | ICD-10-CM | POA: Diagnosis not present

## 2022-06-27 DIAGNOSIS — F1910 Other psychoactive substance abuse, uncomplicated: Secondary | ICD-10-CM | POA: Diagnosis present

## 2022-06-27 DIAGNOSIS — A4101 Sepsis due to Methicillin susceptible Staphylococcus aureus: Principal | ICD-10-CM | POA: Diagnosis present

## 2022-06-27 DIAGNOSIS — E870 Hyperosmolality and hypernatremia: Secondary | ICD-10-CM | POA: Diagnosis present

## 2022-06-27 LAB — CBC WITH DIFFERENTIAL/PLATELET
Abs Immature Granulocytes: 0.14 10*3/uL — ABNORMAL HIGH (ref 0.00–0.07)
Basophils Absolute: 0.1 10*3/uL (ref 0.0–0.1)
Basophils Relative: 1 %
Eosinophils Absolute: 0.2 10*3/uL (ref 0.0–0.5)
Eosinophils Relative: 1 %
HCT: 41.4 % (ref 39.0–52.0)
Hemoglobin: 13.7 g/dL (ref 13.0–17.0)
Immature Granulocytes: 1 %
Lymphocytes Relative: 7 %
Lymphs Abs: 1.4 10*3/uL (ref 0.7–4.0)
MCH: 27.6 pg (ref 26.0–34.0)
MCHC: 33.1 g/dL (ref 30.0–36.0)
MCV: 83.3 fL (ref 80.0–100.0)
Monocytes Absolute: 1.1 10*3/uL — ABNORMAL HIGH (ref 0.1–1.0)
Monocytes Relative: 6 %
Neutro Abs: 16.8 10*3/uL — ABNORMAL HIGH (ref 1.7–7.7)
Neutrophils Relative %: 84 %
Platelets: 208 10*3/uL (ref 150–400)
RBC: 4.97 MIL/uL (ref 4.22–5.81)
RDW: 12.9 % (ref 11.5–15.5)
WBC: 19.8 10*3/uL — ABNORMAL HIGH (ref 4.0–10.5)
nRBC: 0 % (ref 0.0–0.2)

## 2022-06-27 LAB — COMPREHENSIVE METABOLIC PANEL
ALT: 12 U/L (ref 0–44)
AST: 16 U/L (ref 15–41)
Albumin: 2.7 g/dL — ABNORMAL LOW (ref 3.5–5.0)
Alkaline Phosphatase: 124 U/L (ref 38–126)
Anion gap: 15 (ref 5–15)
BUN: 15 mg/dL (ref 8–23)
CO2: 24 mmol/L (ref 22–32)
Calcium: 8.7 mg/dL — ABNORMAL LOW (ref 8.9–10.3)
Chloride: 85 mmol/L — ABNORMAL LOW (ref 98–111)
Creatinine, Ser: 0.84 mg/dL (ref 0.61–1.24)
GFR, Estimated: >60 mL/min (ref >60)
Glucose, Bld: 478 mg/dL — ABNORMAL HIGH (ref 70–99)
Potassium: 4.5 mmol/L (ref 3.5–5.1)
Sodium: 124 mmol/L — ABNORMAL LOW (ref 135–145)
Total Bilirubin: 0.7 mg/dL (ref 0.3–1.2)
Total Protein: 7.5 g/dL (ref 6.5–8.1)

## 2022-06-27 LAB — HEMOGLOBIN A1C
Hgb A1c MFr Bld: 13.4 % — ABNORMAL HIGH (ref 4.8–5.6)
Mean Plasma Glucose: 337.88 mg/dL

## 2022-06-27 LAB — HIV ANTIBODY (ROUTINE TESTING W REFLEX): HIV Screen 4th Generation wRfx: NONREACTIVE

## 2022-06-27 LAB — LACTIC ACID, PLASMA
Lactic Acid, Venous: 1.8 mmol/L (ref 0.5–1.9)
Lactic Acid, Venous: 1.7 mmol/L (ref 0.5–1.9)

## 2022-06-27 LAB — CBG MONITORING, ED
Glucose-Capillary: 487 mg/dL — ABNORMAL HIGH (ref 70–99)
Glucose-Capillary: 254 mg/dL — ABNORMAL HIGH (ref 70–99)

## 2022-06-27 MED ORDER — SODIUM CHLORIDE 0.9 % IV SOLN: INTRAVENOUS | Status: DC

## 2022-06-27 MED ORDER — MORPHINE SULFATE (PF) 4 MG/ML IV SOLN
4.0000 mg | Freq: Once | INTRAVENOUS | Status: AC
  Administered 2022-06-27: 4 mg via INTRAVENOUS
  Filled 2022-06-27: qty 1

## 2022-06-27 MED ORDER — ACETAMINOPHEN 650 MG RE SUPP: 650.0000 mg | Freq: Four times a day (QID) | RECTAL | Status: DC | PRN

## 2022-06-27 MED ORDER — INSULIN ASPART PROT & ASPART (70-30 MIX) 100 UNIT/ML ~~LOC~~ SUSP
25.0000 [IU] | Freq: Two times a day (BID) | SUBCUTANEOUS | Status: DC
Start: 2022-06-27 — End: 2022-06-28
  Filled 2022-06-27: qty 10

## 2022-06-27 MED ORDER — ONDANSETRON HCL 4 MG/2ML IJ SOLN
4.0000 mg | Freq: Once | INTRAMUSCULAR | Status: AC
  Administered 2022-06-27: 4 mg via INTRAVENOUS
  Filled 2022-06-27: qty 2

## 2022-06-27 MED ORDER — INSULIN ASPART 100 UNIT/ML IJ SOLN
0.0000 [IU] | Freq: Three times a day (TID) | INTRAMUSCULAR | Status: DC
  Administered 2022-06-27 – 2022-06-28 (×2): 8 [IU] via SUBCUTANEOUS
  Administered 2022-06-28: 5 [IU] via SUBCUTANEOUS
  Administered 2022-06-28: 11 [IU] via SUBCUTANEOUS
  Administered 2022-06-29: 3 [IU] via SUBCUTANEOUS
  Administered 2022-06-29: 2 [IU] via SUBCUTANEOUS
  Administered 2022-06-30: 3 [IU] via SUBCUTANEOUS
  Administered 2022-06-30: 2 [IU] via SUBCUTANEOUS
  Administered 2022-06-30: 3 [IU] via SUBCUTANEOUS
  Administered 2022-07-01: 2 [IU] via SUBCUTANEOUS
  Administered 2022-07-01 (×2): 3 [IU] via SUBCUTANEOUS
  Administered 2022-07-02: 8 [IU] via SUBCUTANEOUS
  Administered 2022-07-02: 2 [IU] via SUBCUTANEOUS
  Administered 2022-07-03 (×2): 3 [IU] via SUBCUTANEOUS
  Administered 2022-07-03: 5 [IU] via SUBCUTANEOUS
  Administered 2022-07-04 (×2): 2 [IU] via SUBCUTANEOUS

## 2022-06-27 MED ORDER — VANCOMYCIN HCL 1250 MG/250ML IV SOLN
1250.0000 mg | Freq: Two times a day (BID) | INTRAVENOUS | Status: DC
  Administered 2022-06-28 – 2022-07-03 (×11): 1250 mg via INTRAVENOUS
  Filled 2022-06-27 (×11): qty 250

## 2022-06-27 MED ORDER — SODIUM CHLORIDE 0.9 % IV BOLUS
1000.0000 mL | Freq: Once | INTRAVENOUS | Status: AC
  Administered 2022-06-27: 1000 mL via INTRAVENOUS

## 2022-06-27 MED ORDER — IOHEXOL 300 MG/ML  SOLN
100.0000 mL | Freq: Once | INTRAMUSCULAR | Status: AC | PRN
  Administered 2022-06-27: 75 mL via INTRAVENOUS

## 2022-06-27 MED ORDER — LISINOPRIL 10 MG PO TABS
10.0000 mg | ORAL_TABLET | Freq: Every day | ORAL | Status: DC
  Administered 2022-06-28 – 2022-07-02 (×5): 10 mg via ORAL
  Filled 2022-06-27 (×5): qty 1

## 2022-06-27 MED ORDER — ONDANSETRON HCL 4 MG/2ML IJ SOLN: 4.0000 mg | Freq: Four times a day (QID) | INTRAMUSCULAR | Status: DC | PRN

## 2022-06-27 MED ORDER — ONDANSETRON HCL 4 MG PO TABS: 4.0000 mg | ORAL_TABLET | Freq: Four times a day (QID) | ORAL | Status: DC | PRN

## 2022-06-27 MED ORDER — INSULIN ASPART 100 UNIT/ML IJ SOLN
0.0000 [IU] | Freq: Every day | INTRAMUSCULAR | Status: DC
  Administered 2022-06-28: 4 [IU] via SUBCUTANEOUS

## 2022-06-27 MED ORDER — ACETAMINOPHEN 325 MG PO TABS
650.0000 mg | ORAL_TABLET | Freq: Four times a day (QID) | ORAL | Status: DC | PRN
  Administered 2022-06-30 – 2022-07-04 (×12): 650 mg via ORAL
  Filled 2022-06-27 (×12): qty 2

## 2022-06-27 MED ORDER — PIPERACILLIN-TAZOBACTAM 3.375 G IVPB 30 MIN
3.3750 g | Freq: Once | INTRAVENOUS | Status: AC
  Administered 2022-06-27: 3.375 g via INTRAVENOUS
  Filled 2022-06-27: qty 50

## 2022-06-27 MED ORDER — PIPERACILLIN-TAZOBACTAM 3.375 G IVPB
3.3750 g | Freq: Three times a day (TID) | INTRAVENOUS | Status: DC
  Administered 2022-06-28 – 2022-07-01 (×9): 3.375 g via INTRAVENOUS
  Filled 2022-06-27 (×11): qty 50

## 2022-06-27 MED ORDER — VANCOMYCIN HCL 2000 MG/400ML IV SOLN
2000.0000 mg | Freq: Once | INTRAVENOUS | Status: AC
  Administered 2022-06-27: 2000 mg via INTRAVENOUS
  Filled 2022-06-27: qty 400

## 2022-06-27 MED ORDER — OXYCODONE HCL 5 MG PO TABS
5.0000 mg | ORAL_TABLET | ORAL | Status: DC | PRN
  Administered 2022-06-27 – 2022-07-05 (×38): 5 mg via ORAL
  Filled 2022-06-27 (×38): qty 1

## 2022-06-27 MED ORDER — LACTATED RINGERS IV BOLUS
1000.0000 mL | Freq: Once | INTRAVENOUS | Status: AC
  Administered 2022-06-27: 1000 mL via INTRAVENOUS

## 2022-06-27 MED ORDER — HYDRALAZINE HCL 25 MG PO TABS: 25.0000 mg | ORAL_TABLET | Freq: Four times a day (QID) | ORAL | Status: DC | PRN

## 2022-06-27 MED ORDER — ENOXAPARIN SODIUM 40 MG/0.4ML IJ SOSY
40.0000 mg | PREFILLED_SYRINGE | INTRAMUSCULAR | Status: DC
  Administered 2022-06-27 – 2022-07-05 (×8): 40 mg via SUBCUTANEOUS
  Filled 2022-06-27 (×8): qty 0.4

## 2022-06-27 NOTE — ED Notes (Signed)
Called lab regarding A1c add on to previously collected labs. Per lab they will add on.

## 2022-06-27 NOTE — ED Triage Notes (Deleted)
Patient has wound on right foot between great toe and 2nd toes.  Draining purulent drainage.  Reports he ran out of antibiotics.

## 2022-06-27 NOTE — Progress Notes (Signed)
Pharmacy Antibiotic Note  Jeffrey Murphy is a 66 y.o. male admitted on 06/27/2022 with  cellulitis/abscess .  Pharmacy has been consulted for vancomycin and zosyn dosing.  Plan: Vancomycin 2000mg  IV x1 as loading dose followed by vancomycin 1250mg  IV every 12 hours (predicted AUC 476; using Vd 0.5L/kg).  Goal AUC 400-550 mcg/ml*hr Check vancomycin levels as needed   Zosyn 3.375g IV q8h (4 hour infusion). Monitor culture data, CBC, clinical progress   Height: 5\' 9"  (175.3 cm) Weight: 95.3 kg (210 lb) IBW/kg (Calculated) : 70.7  Temp (24hrs), Avg:98.2 F (36.8 C), Min:98 F (36.7 C), Max:98.6 F (37 C)  Recent Labs  Lab 06/27/22 0809  WBC 19.8*  CREATININE 0.84  LATICACIDVEN 1.8    Estimated Creatinine Clearance: 102.5 mL/min (by C-G formula based on SCr of 0.84 mg/dL).    No Known Allergies  Antimicrobials this admission: Vancomycin  8/21 >>  Zosyn 8/21 >>   Dose adjustments this admission:   Microbiology results:   Thank you for allowing pharmacy to be a part of this patient's care.  06/29/22 Clinical Pharmacist 06/27/2022 3:58 PM

## 2022-06-27 NOTE — ED Provider Notes (Signed)
[facility] EMERGENCY DEPARTMENT Provider Note   CSN: [id number] Arrival date & time: 06/27/22  0741     History  Chief Complaint  Patient presents with   Abscess    Jeffrey Murphy is a 66 y.o. male with a history of DMT2, polysubstance abuse, HTN, anxiety, and homelessness.  Presenting today with chief complaint of large mass/cyst on the back of his neck.  Was outside in the heat, scratched his head/the back of his neck and noticed a bump.  States it is significantly worsened over the last few days.  Denies difficulty swallowing, headache, vision changes, fever, shortness of breath, or chest pain.  Although does endorse weight loss over the last 6 to 12 months.  States "my pants are falling around my ankles, I can keep them up".  Admits to recent cocaine use the last 48 hours, however denies IVDU.  Also states he has been out of his insulin and other regular medications for the last month.  The history is provided by the patient and medical records.  Abscess      Home Medications Prior to Admission medications   Medication Sig Start Date End Date Taking? Authorizing Provider  amoxicillin (AMOXIL) 875 MG tablet Take 1 tablet (875 mg total) by mouth 2 (two) times daily. Take after eating Patient not taking: Reported on 07/24/2019 04/15/19   Fulp, Ander Gaster, MD  atorvastatin (LIPITOR) 40 MG tablet Take 40 mg by mouth at bedtime. 02/02/22   [provider]  blood glucose meter kit and supplies KIT Dispense based on patient and insurance preference. Use up to four times daily as directed. (FOR ICD-9 250.00, 250.01). 12/17/16   Rai, Ripudeep K, MD  Blood Glucose Monitoring Suppl (CONTOUR NEXT MONITOR) w/Device KIT 1 each by Does not apply route 3 (three) times daily. 04/15/19   Fulp, Cammie, MD  doxycycline (VIBRA-TABS) 100 MG tablet Take 1 tablet (100 mg total) by mouth 2 (two) times daily. 03/16/22   Edrick Kins, DPM  furosemide (LASIX) 20 MG tablet Take 1 tablet (20  mg total) by mouth daily. If needed for leg swelling 07/24/19   Fulp, Cammie, MD  gabapentin (NEURONTIN) 300 MG capsule Take by mouth. 03/07/22   [provider]  gentamicin cream (GARAMYCIN) 0.1 % Apply 1 application. topically 2 (two) times daily. 03/16/22   Edrick Kins, DPM  glipiZIDE (GLUCOTROL) 10 MG tablet Take 1 tablet (10 mg total) by mouth 2 (two) times daily before a meal. To lower blood sugars 10/08/19   Fulp, Cammie, MD  glucose blood (CONTOUR NEXT TEST) test strip Use as instructed 04/15/19   Fulp, Cammie, MD  insulin NPH-regular Human (NOVOLIN 70/30) (70-30) 100 UNIT/ML injection Inject 25 Units into the skin 2 (two) times daily with a meal. 07/24/19   Fulp, Cammie, MD  Insulin Pen Needle (TRUEPLUS PEN NEEDLES) 32G X 4 MM MISC Use to inject insulin daily. 05/29/19   Fulp, Cammie, MD  Insulin Syringes, Disposable, U-100 0.5 ML MISC Use as directed for insulin Patient not taking: Reported on 05/29/2019 12/13/18   Antony Blackbird, MD  Lancet Devices (MICROLET NEXT LANCING DEVICE) MISC 1 each by Does not apply route 3 (three) times daily. Patient not taking: Reported on 05/29/2019 04/15/19   Fulp, Cammie, MD  lisinopril (ZESTRIL) 20 MG tablet Take 1 tablet (20 mg total) by mouth daily. To lower blood pressure 07/24/19   Fulp, Cammie, MD  lisinopril (ZESTRIL) 30 MG tablet Take 30 mg by mouth daily.  02/02/22   [provider]  Microlet Lancets MISC 1 each by Subdermal route 3 (three) times daily. 04/15/19   Fulp, Cammie, MD  MOUNJARO 5 MG/0.5ML Pen Inject into the skin. 03/07/22   [provider]  tamsulosin (FLOMAX) 0.4 MG CAPS capsule Take 1 capsule (0.4 mg total) by mouth daily. Patient not taking: Reported on 05/29/2019 12/13/18   Cain Saupe, MD  traZODone (DESYREL) 50 MG tablet Take 1 tablet (50 mg total) by mouth at bedtime as needed for sleep. Patient not taking: Reported on 05/29/2019 04/15/19   Cain Saupe, MD      Allergies    Patient has no known allergies.    Review of  Systems   Review of Systems  Skin:        Abscess    Physical Exam Updated Vital Signs BP 123/74   Pulse (!) 110   Temp 98.3 F (36.8 C) (Oral)   Resp 18   Ht 5\' 9"  (1.753 m)   Wt 95.3 kg   SpO2 98%   BMI 31.01 kg/m  Physical Exam Vitals and nursing note reviewed.  Constitutional:      General: He is not in acute distress.    Appearance: He is well-developed. He is not ill-appearing, toxic-appearing or diaphoretic.     Comments: Appears disheveled  HENT:     Head: Normocephalic and atraumatic.     Nose: Nose normal.     Mouth/Throat:     Pharynx: Oropharynx is clear.     Comments: No appreciated dysphagia Eyes:     Conjunctiva/sclera: Conjunctivae normal.  Neck:     Comments: Reduced ROM, palpable mass and indurated skin.  No significant fluctuance appreciated.  See photos Cardiovascular:     Rate and Rhythm: Normal rate and regular rhythm.     Heart sounds: No murmur heard. Pulmonary:     Effort: Pulmonary effort is normal. No respiratory distress.     Breath sounds: Normal breath sounds. No wheezing.  Chest:     Chest wall: No tenderness.  Abdominal:     Palpations: Abdomen is soft.     Tenderness: There is no abdominal tenderness. There is no guarding.  Musculoskeletal:        General: No swelling.     Cervical back: Tenderness present.  Lymphadenopathy:     Cervical: Cervical adenopathy present.  Skin:    General: Skin is warm and dry.     Capillary Refill: Capillary refill takes less than 2 seconds.  Neurological:     Mental Status: He is alert and oriented to person, place, and time.  Psychiatric:        Mood and Affect: Mood normal.            ED Results / Procedures / Treatments   Labs (all labs ordered are listed, but only abnormal results are displayed) Labs Reviewed  COMPREHENSIVE METABOLIC PANEL - Abnormal; Notable for the following components:      Result Value   Sodium 124 (*)    Chloride 85 (*)    Glucose, Bld 478 (*)     Calcium 8.7 (*)    Albumin 2.7 (*)    All other components within normal limits  CBC WITH DIFFERENTIAL/PLATELET - Abnormal; Notable for the following components:   WBC 19.8 (*)    Neutro Abs 16.8 (*)    Monocytes Absolute 1.1 (*)    Abs Immature Granulocytes 0.14 (*)    All other components within normal limits  CBG MONITORING, ED - Abnormal; Notable for the following components:   Glucose-Capillary 487 (*)    All other components within normal limits  AEROBIC CULTURE W GRAM STAIN (SUPERFICIAL SPECIMEN)  CULTURE, BLOOD (ROUTINE X 2)  CULTURE, BLOOD (ROUTINE X 2)  LACTIC ACID, PLASMA  LACTIC ACID, PLASMA  URINALYSIS, ROUTINE W REFLEX MICROSCOPIC    EKG None  Radiology CT Soft Tissue Neck W Contrast  Result Date: 06/27/2022 CLINICAL DATA:  Provided history: Soft tissue swelling, infection suspected, neck x-ray done; significant abscess to posterior neck-possible tracking wound, rule out underlying cervical osteomyelitis. EXAM: CT NECK WITH CONTRAST TECHNIQUE: Multidetector CT imaging of the neck was performed using the standard protocol following the bolus administration of intravenous contrast. RADIATION DOSE REDUCTION: This exam was performed according to the departmental dose-optimization program which includes automated exposure control, adjustment of the mA and/or kV according to patient size and/or use of iterative reconstruction technique. CONTRAST:  60mL OMNIPAQUE IOHEXOL 300 MG/ML  SOLN COMPARISON:  Neck CT 11/30/2017. FINDINGS: Pharynx and larynx: Poor dentition with multiple absent, carious and fractured teeth and multifocal periapical lucencies. No appreciable swelling or mass within the oral cavity, pharynx or larynx. Salivary glands: The right parotid gland appears surgically absent. Unremarkable appearance of the left parotid and bilateral submandibular glands. Thyroid: Unremarkable. Lymph nodes: Bilateral cervical lymphadenopathy, most notably as follows. An enlarged right  level 5 lymph node measures 13 mm in short axis (series 5, image 29) (series 3, image 60). An enlarged left level 2/5 lymph node measures 13 mm in short axis (series 5, image 80) (series 3, image 52). Vascular: The major vascular structures of the neck are patent. Atherosclerotic plaque within the visualized aortic arch, within the proximal major branch vessels of the neck, about the carotid bifurcations and within the proximal ICAs. Possible hemodynamically significant stenoses within the proximal ICAs. Limited intracranial: No evidence of acute intracranial abnormality within the field of view. Visualized orbits: Incompletely imaged. No orbital mass or acute orbital finding at the imaged levels. Mastoids and visualized paranasal sinuses: Portions of the frontal sinuses are excluded from the field of view superiorly. 8 mm mucous retention cyst within the right maxillary sinus. 12 mm mucous retention cyst within the left maxillary sinus. No significant mastoid effusion. Skeleton: Cervical spondylosis with multilevel disc space narrowing, disc bulges, endplate spurring and uncovertebral hypertrophy. Multilevel spinal canal stenosis. Most notably, disc bulges contribute to apparent moderate spinal canal stenosis at C3-C4 and C4-C5. Multilevel bony neural foraminal narrowing. Multilevel ventrolateral osteophytes. No acute fracture or aggressive osseous lesion. Upper chest: No consolidation within the imaged lung apices. Other: There is extensive edema, inflammatory stranding and infiltration within the subcutaneous fat of the posterior neck (with overlying skin thickening). Findings are compatible with cellulitis and phlegmon. 4 mm peripherally enhancing focus within the subcutaneous fat of the posterior neck, eccentric to the right at the C5 level (series 5, image 26) (series 3, image 51). This is suspicious for a small abscess. IMPRESSION: Extensive cellulitis and phlegmon within the posterior neck soft tissues.  Suspected 4 mm abscess within the subcutaneous fat of the posterior neck, eccentric to the right at the C5 level. Associated bilateral cervical lymphadenopathy, as described and likely reactive. No CT evidence of osteomyelitis within the cervical spine. Cervical spondylosis, as described. Mucous retention cysts within the bilateral maxillary sinuses measuring up to 12 mm. Atherosclerotic plaque within the visualized aortic arch, proximal major branch vessels of the neck and carotid arteries. Possible hemodynamically significant stenosis within the  proximal ICAs, bilaterally. Consider a non-emergent carotid artery duplex for further evaluation. Electronically Signed   By: Kellie Simmering D.O.   On: 06/27/2022 10:49    Procedures Procedures    Medications Ordered in ED Medications  sodium chloride 0.9 % bolus 1,000 mL (has no administration in time range)  morphine (PF) 4 MG/ML injection 4 mg (has no administration in time range)  ondansetron (ZOFRAN) injection 4 mg (has no administration in time range)  vancomycin (VANCOREADY) IVPB 2000 mg/400 mL (has no administration in time range)    Followed by  vancomycin (VANCOREADY) IVPB 1250 mg/250 mL (has no administration in time range)  piperacillin-tazobactam (ZOSYN) IVPB 3.375 g (has no administration in time range)    Followed by  piperacillin-tazobactam (ZOSYN) IVPB 3.375 g (has no administration in time range)  lactated ringers bolus 1,000 mL (1,000 mLs Intravenous New Bag/Given 06/27/22 1553)  iohexol (OMNIPAQUE) 300 MG/ML solution 100 mL (75 mLs Intravenous Contrast Given 06/27/22 1006)    ED Course/ Medical Decision Making/ A&P Clinical Course as of 06/27/22 1610  Mon Jun 27, 2022  1355 Consulted with Dr. Janace Hoard of ENT, plans to visit the patient and agrees with plan for admission and IV antibiotics [AC]  1608 Consulted with Dr. Sloan Leiter of Hospitalist team, discussed patient at length.  Plan is to admit [AC]    Clinical Course User Index [AC]  Prince Rome, PA-C                           Medical Decision Making Amount and/or Complexity of Data Reviewed Labs: ordered.  Risk Prescription drug management. Decision regarding hospitalization.   66 y.o. male presents to the ED for concern of Abscess     This involves an extensive number of treatment options, and is a complaint that carries with it a high risk of complications and morbidity.  The emergent differential diagnosis prior to evaluation includes, but is not limited to: cellulitis, abscess, discitis  This is not an exhaustive differential.   Past Medical History / Co-morbidities / Social History: Hx of DMT2, polysubstance abuse, HTN, anxiety, and homelessness Social Determinants of Health include: homelessness, for which resources are provided.  Polysubstance use for which cessation counseling was provided.  Poor compliance, for which counseling was provided.  Additional History:  Obtained by chart review.  Notably Hx of poorly controlled DMT2  Lab Tests: I ordered, and personally interpreted labs.  The pertinent results include:   CBG 487, consistent with poorly controlled DMT2 Lactic acid 1.8 Sodium 124, corrected to 130-133 WBC 19.8 with leukocytosis Blood cultures -- Pending  Imaging Studies: I ordered imaging studies including CT neck soft tissue.   I independently visualized and interpreted imaging which showed  Extensive cellulitis and phlegmon within the posterior neck soft tissues.  Suspected 4 mm abscess within the subcutaneous fat of the posterior neck, eccentric to the right at the C5 level.  Associated bilateral cervical lymphadenopathy No CT evidence of osteomyelitis in c-spine  Cervical spondylosis Mucous retention cysts within the bilateral maxillary sinuses measuring up to 12 mm.  Atherosclerotic plaque within the visualized aortic arch, proximal major branch vessels of the neck and carotid arteries. Possible hemodynamically significant  stenosis within the proximal ICAs, bilaterally. Consider a non-emergent carotid artery duplex for further evaluation I agree with the radiologist interpretation.  Cardiac Monitoring: The patient was maintained on a cardiac monitor.  I personally viewed and interpreted the cardiac monitored which showed an  underlying rhythm of: Sinus tachycardia  ED Course / Critical Interventions: Pt overall well-appearing on exam.  Nontoxic, nonseptic appearing, though appears uncomfortable.  Hx of homelessness, poorly controlled DMT2.  Presenting with significant cellulitis of the posterior neck that has progressively worsened at least over the last 5 days.  Neck with stiffness and reduced ROM.  Without fever, headache, dysphagia.  Although endorses recent weight loss.  Elevated glucose, corrected sodium noted.  CT imaging demonstrates extensive cellulitis and phlegmon, although no distinct drainable abscesses appreciated on exam.  Patient with multiple comorbidities.  Broad coverage antibiotics initiated of Vanco and Zosyn.  Fluid bolus and Zofran provided.  Pain managed in the ED.  Patient will need admission for IV antibiotics.  Consultation placed for ENT and hospitalist. Upon reevaluation, patient status is unchanged.  Consulted with ENT and hospitalist team, plan is for IV antibiotics and admission.  Disposition: Admission  I discussed this case with my attending, Dr. Regenia Skeeter, who agreed with the proposed treatment course and cosigned this note including patient's presenting symptoms, physical exam, and planned diagnostics and interventions.  Attending physician stated agreement with plan or made changes to plan which were implemented.     This chart was dictated using voice recognition software.  Despite best efforts to proofread, errors can occur which can change the documentation meaning.         Final Clinical Impression(s) / ED Diagnoses Final diagnoses:  Abscess of neck    Rx / DC  Orders ED Discharge Orders     None         Candace Cruise 24/46/28 1611    Sherwood Gambler, MD 06/30/22 [phone]

## 2022-06-27 NOTE — ED Provider Triage Note (Signed)
Emergency Medicine Provider Triage Evaluation Note  Jeffrey Murphy , a 66 y.o. male  was evaluated in triage.  Pt complains of wound to the posterior neck.  Patient states it has been there for about 5 days.  He states that he was seen at urgent care and it appears he was referred to the emergency room.  He denies any fevers, nausea or vomiting.  He is diabetic and states that he has not taken his insulin in about a month due to financial constraints..  Review of Systems  Positive:  Negative:   Physical Exam  BP 120/81   Pulse (!) 114   Temp 98.1 F (36.7 C)   Resp 16   Ht 5\' 9"  (1.753 m)   Wt 95.3 kg   SpO2 95%   BMI 31.01 kg/m  Gen:   Awake, no distress   Resp:  Normal effort  MSK:   Moves extremities without difficulty  Other:  Large abscess noted to the posterior neck with purulent drainage  Medical Decision Making  Medically screening exam initiated at 8:06 AM.  Appropriate orders placed.  DAVIUS GOUDEAU was informed that the remainder of the evaluation will be completed by another provider, this initial triage assessment does not replace that evaluation, and the importance of remaining in the ED until their evaluation is complete.     [name], PA-C 06/27/22 [phone]

## 2022-06-27 NOTE — ED Triage Notes (Addendum)
Patient multiple abscess to back of neck almost like a carbuncle.  Patient denies fevers.  Patient DM and has been out of insulin and other meds x 1 month

## 2022-06-27 NOTE — H&P (Signed)
History and Physical    Jeffrey Murphy:[id number] DOB: [birth year] DOA: 06/27/2022  PCP: Pcp, No  Jeffrey Murphy coming from: Home  I have personally briefly reviewed Jeffrey Murphy's old medical records available.   Chief Complaint: Neck pain and blister for 5 days  HPI: Jeffrey Murphy is a 66 y.o. male with medical history significant of uncontrolled type 2 diabetes currently not taking any treatment, essential hypertension, cocaine use, anxiety presents to the emergency room with about 5 days of posterior neck pain and swelling with drainage.  Jeffrey Murphy tells me that Jeffrey Murphy stopped taking his diabetes medications for many months now because of cost and also because of being "stupid".  Jeffrey Murphy is very talkative.  Jeffrey Murphy tells me that Jeffrey Murphy was working in heat outside about 5 days ago, Jeffrey Murphy scratched his neck and then Jeffrey Murphy started having drainage and swelling.  Significantly worsening since last 5 days.  Pain is mild to moderate about 5 out of 10.  Denies any difficulty swallowing, cough or drooling. Denies any fever or chills.  Denies any nausea or vomiting.  Denies any headache dizziness lightheadedness.  Jeffrey Murphy told ER that Jeffrey Murphy is homeless, however Jeffrey Murphy tells me that Jeffrey Murphy lives with his wife and children at [facility] [place]. ED Course: Blood pressure stable.  Tachycardic with heart rate 130.  WBC count 19.8.  Sodium 124 with glucose of 478.  A CT scan of the neck soft tissue consistent with extensive lymphadenopathy, multiple small swelling and very small abscess on the posterior neck.  ENT was consulted who recommended conservative management.  Jeffrey Murphy is started on IV fluids and antibiotics and admission suggested.  Review of Systems: all systems are reviewed and pertinent positive as per HPI otherwise rest are negative.    Past Medical History:  Diagnosis Date   Abscess 12/2016   LEFT INGUINAL ABSCESS   Arthritis    Diabetes mellitus without complication ([place])    Hypertension    Wears glasses    reading    Past  Surgical History:  Procedure Laterality Date   COLONOSCOPY     CYST REMOVAL NECK     INGUINAL HERNIA REPAIR     right   IRRIGATION AND DEBRIDEMENT ABSCESS Left 12/12/2016   Procedure: IRRIGATION AND DEBRIDEMENT ABSCESS;  Surgeon: Stark Klein, MD;  Location: [facility];  Service: General;  Laterality: Left;   JOINT REPLACEMENT     right total knee   PILONIDAL CYST EXCISION     back   QUADRICEPS TENDON REPAIR Right 12/24/2014   Procedure: RIGHT QUADRICEP TENDON REPAIR ;  Surgeon: Marianna Payment, MD;  Location: [facility];  Service: Orthopedics;  Laterality: Right;   REPLACEMENT TOTAL KNEE      Social history   reports that Jeffrey Murphy has never smoked. Jeffrey Murphy has never used smokeless tobacco. Jeffrey Murphy reports that Jeffrey Murphy does not currently use alcohol. Jeffrey Murphy reports current drug use. Frequency: 5.00 times per week. Drugs: Marijuana, Cocaine, and "Crack" cocaine.  No Known Allergies  Family History  Problem Relation Age of Onset   Cancer Mother    Hypertension Mother    Diabetes Father      Prior to Admission medications   Medication Sig Start Date End Date Taking? Authorizing Provider  amoxicillin (AMOXIL) 875 MG tablet Take 1 tablet (875 mg total) by mouth 2 (two) times daily. Take after eating Jeffrey Murphy not taking: Reported on 07/24/2019 04/15/19   Fulp, Ander Gaster, MD  atorvastatin (LIPITOR) 40 MG tablet Take 40 mg by mouth at  bedtime. 02/02/22   [provider]  blood glucose meter kit and supplies KIT Dispense based on Jeffrey Murphy and insurance preference. Use up to four times daily as directed. (FOR ICD-9 250.00, 250.01). 12/17/16   Rai, Ripudeep K, MD  Blood Glucose Monitoring Suppl (CONTOUR NEXT MONITOR) w/Device KIT 1 each by Does not apply route 3 (three) times daily. 04/15/19   Fulp, Cammie, MD  doxycycline (VIBRA-TABS) 100 MG tablet Take 1 tablet (100 mg total) by mouth 2 (two) times daily. 03/16/22   Edrick Kins, DPM  furosemide (LASIX) 20 MG tablet Take 1 tablet (20 mg total) by mouth  daily. If needed for leg swelling 07/24/19   Fulp, Cammie, MD  gabapentin (NEURONTIN) 300 MG capsule Take by mouth. 03/07/22   [provider]  gentamicin cream (GARAMYCIN) 0.1 % Apply 1 application. topically 2 (two) times daily. 03/16/22   Edrick Kins, DPM  glipiZIDE (GLUCOTROL) 10 MG tablet Take 1 tablet (10 mg total) by mouth 2 (two) times daily before a meal. To lower blood sugars 10/08/19   Fulp, Cammie, MD  glucose blood (CONTOUR NEXT TEST) test strip Use as instructed 04/15/19   Fulp, Cammie, MD  insulin NPH-regular Human (NOVOLIN 70/30) (70-30) 100 UNIT/ML injection Inject 25 Units into the skin 2 (two) times daily with a meal. 07/24/19   Fulp, Cammie, MD  Insulin Pen Needle (TRUEPLUS PEN NEEDLES) 32G X 4 MM MISC Use to inject insulin daily. 05/29/19   Fulp, Cammie, MD  Insulin Syringes, Disposable, U-100 0.5 ML MISC Use as directed for insulin Jeffrey Murphy not taking: Reported on 05/29/2019 12/13/18   Antony Blackbird, MD  Lancet Devices (MICROLET NEXT LANCING DEVICE) MISC 1 each by Does not apply route 3 (three) times daily. Jeffrey Murphy not taking: Reported on 05/29/2019 04/15/19   Fulp, Cammie, MD  lisinopril (ZESTRIL) 20 MG tablet Take 1 tablet (20 mg total) by mouth daily. To lower blood pressure 07/24/19   Fulp, Cammie, MD  lisinopril (ZESTRIL) 30 MG tablet Take 30 mg by mouth daily. 02/02/22   [provider]  Microlet Lancets MISC 1 each by Subdermal route 3 (three) times daily. 04/15/19   Fulp, Cammie, MD  MOUNJARO 5 MG/0.5ML Pen Inject into the skin. 03/07/22   [provider]  tamsulosin (FLOMAX) 0.4 MG CAPS capsule Take 1 capsule (0.4 mg total) by mouth daily. Jeffrey Murphy not taking: Reported on 05/29/2019 12/13/18   Antony Blackbird, MD  traZODone (DESYREL) 50 MG tablet Take 1 tablet (50 mg total) by mouth at bedtime as needed for sleep. Jeffrey Murphy not taking: Reported on 05/29/2019 04/15/19   Antony Blackbird, MD    Physical Exam: Vitals:   06/27/22 0754 06/27/22 1021 06/27/22 1301 06/27/22 1531   BP:  123/80 125/86 123/74  Pulse:  98 90 (!) 110  Resp:  _0 Temp:  98 F (36.7 C) 98.6 F (37 C) 98.3 F (36.8 C)  TempSrc:  Oral Oral Oral  SpO2:  96% 98% 98%  Weight: 95.3 kg     Height: 5' 9" (1.753 m)       Constitutional: NAD, calm, comfortable, talkative.  Disheveled. Vitals:   06/27/22 0754 06/27/22 1021 06/27/22 1301 06/27/22 1531  BP:  123/80 125/86 123/74  Pulse:  98 90 (!) 110  Resp:  _1 Temp:  98 F (36.7 C) 98.6 F (37 C) 98.3 F (36.8 C)  TempSrc:  Oral Oral Oral  SpO2:  96% 98% 98%  Weight: 95.3 kg  Height: 5' 9" (1.753 m)      Eyes: PERRL, lids and conjunctivae normal ENMT: Mucous membranes are moist. Posterior pharynx clear of any exudate or lesions.Normal dentition.  Neck: normal, supple, multiple enlarged lymph nodes along the cervical chain. Diffuse swelling with some drainage on the posterior neck.  Pictures attached. Respiratory: clear to auscultation bilaterally, no wheezing, no crackles. Normal respiratory effort. No accessory muscle use.  Cardiovascular: Regular rate and rhythm, no murmurs / rubs / gallops. No extremity edema. 2+ pedal pulses. No carotid bruits.  Abdomen: no tenderness, no masses palpated. No hepatosplenomegaly. Bowel sounds positive.  Musculoskeletal: no clubbing / cyanosis. No joint deformity upper and lower extremities. Good ROM, no contractures. Normal muscle tone.  Skin: no rashes, lesions, ulcers. No induration Neurologic: CN 2-12 grossly intact. Sensation intact, DTR normal. Strength 5/5 in all 4.  Psychiatric: Normal judgment and insight. Alert and oriented x 3. Normal mood.      Labs on Admission: I have personally reviewed following labs and imaging studies  CBC: Recent Labs  Lab 06/27/22 0809  WBC 19.8*  NEUTROABS 16.8*  HGB 13.7  HCT 41.4  MCV 83.3  PLT 683   Basic Metabolic Panel: Recent Labs  Lab 06/27/22 0809  NA 124*  K 4.5  CL 85*  CO2 24  GLUCOSE 478*  BUN 15  CREATININE  0.84  CALCIUM 8.7*   GFR: Estimated Creatinine Clearance: 102.5 mL/min (by C-G formula based on SCr of 0.84 mg/dL). Liver Function Tests: Recent Labs  Lab 06/27/22 0809  AST 16  ALT 12  ALKPHOS 124  BILITOT 0.7  PROT 7.5  ALBUMIN 2.7*   No results for input(s): "LIPASE", "AMYLASE" in the last 168 hours. No results for input(s): "AMMONIA" in the last 168 hours. Coagulation Profile: No results for input(s): "INR", "PROTIME" in the last 168 hours. Cardiac Enzymes: No results for input(s): "CKTOTAL", "CKMB", "CKMBINDEX", "TROPONINI" in the last 168 hours. BNP (last 3 results) No results for input(s): "PROBNP" in the last 8760 hours. HbA1C: No results for input(s): "HGBA1C" in the last 72 hours. CBG: Recent Labs  Lab 06/27/22 0812  GLUCAP 487*   Lipid Profile: No results for input(s): "CHOL", "HDL", "LDLCALC", "TRIG", "CHOLHDL", "LDLDIRECT" in the last 72 hours. Thyroid Function Tests: No results for input(s): "TSH", "T4TOTAL", "FREET4", "T3FREE", "THYROIDAB" in the last 72 hours. Anemia Panel: No results for input(s): "VITAMINB12", "FOLATE", "FERRITIN", "TIBC", "IRON", "RETICCTPCT" in the last 72 hours. Urine analysis:    Component Value Date/Time   COLORURINE YELLOW 12/01/2017 0129   APPEARANCEUR CLEAR 12/01/2017 0129   LABSPEC >1.046 (H) 12/01/2017 0129   PHURINE 6.0 12/01/2017 0129   GLUCOSEU >=500 (A) 12/01/2017 0129   HGBUR NEGATIVE 12/01/2017 0129   BILIRUBINUR NEGATIVE 12/01/2017 0129   KETONESUR 5 (A) 12/01/2017 0129   PROTEINUR 30 (A) 12/01/2017 0129   NITRITE NEGATIVE 12/01/2017 0129   LEUKOCYTESUR NEGATIVE 12/01/2017 0129    Radiological Exams on Admission: CT Soft Tissue Neck W Contrast  Result Date: 06/27/2022 CLINICAL DATA:  Provided history: Soft tissue swelling, infection suspected, neck x-ray done; significant abscess to posterior neck-possible tracking wound, rule out underlying cervical osteomyelitis. EXAM: CT NECK WITH CONTRAST TECHNIQUE:  Multidetector CT imaging of the neck was performed using the standard protocol following the bolus administration of intravenous contrast. RADIATION DOSE REDUCTION: This exam was performed according to the departmental dose-optimization program which includes automated exposure control, adjustment of the mA and/or kV according to Jeffrey Murphy size and/or use of iterative reconstruction technique.  CONTRAST:  77m OMNIPAQUE IOHEXOL 300 MG/ML  SOLN COMPARISON:  Neck CT 11/30/2017. FINDINGS: Pharynx and larynx: Poor dentition with multiple absent, carious and fractured teeth and multifocal periapical lucencies. No appreciable swelling or mass within the oral cavity, pharynx or larynx. Salivary glands: The right parotid gland appears surgically absent. Unremarkable appearance of the left parotid and bilateral submandibular glands. Thyroid: Unremarkable. Lymph nodes: Bilateral cervical lymphadenopathy, most notably as follows. An enlarged right level 5 lymph node measures 13 mm in short axis (series 5, image 29) (series 3, image 60). An enlarged left level 2/5 lymph node measures 13 mm in short axis (series 5, image 80) (series 3, image 52). Vascular: The major vascular structures of the neck are patent. Atherosclerotic plaque within the visualized aortic arch, within the proximal major branch vessels of the neck, about the carotid bifurcations and within the proximal ICAs. Possible hemodynamically significant stenoses within the proximal ICAs. Limited intracranial: No evidence of acute intracranial abnormality within the field of view. Visualized orbits: Incompletely imaged. No orbital mass or acute orbital finding at the imaged levels. Mastoids and visualized paranasal sinuses: Portions of the frontal sinuses are excluded from the field of view superiorly. 8 mm mucous retention cyst within the right maxillary sinus. 12 mm mucous retention cyst within the left maxillary sinus. No significant mastoid effusion. Skeleton:  Cervical spondylosis with multilevel disc space narrowing, disc bulges, endplate spurring and uncovertebral hypertrophy. Multilevel spinal canal stenosis. Most notably, disc bulges contribute to apparent moderate spinal canal stenosis at C3-C4 and C4-C5. Multilevel bony neural foraminal narrowing. Multilevel ventrolateral osteophytes. No acute fracture or aggressive osseous lesion. Upper chest: No consolidation within the imaged lung apices. Other: There is extensive edema, inflammatory stranding and infiltration within the subcutaneous fat of the posterior neck (with overlying skin thickening). Findings are compatible with cellulitis and phlegmon. 4 mm peripherally enhancing focus within the subcutaneous fat of the posterior neck, eccentric to the right at the C5 level (series 5, image 26) (series 3, image 51). This is suspicious for a small abscess. IMPRESSION: Extensive cellulitis and phlegmon within the posterior neck soft tissues. Suspected 4 mm abscess within the subcutaneous fat of the posterior neck, eccentric to the right at the C5 level. Associated bilateral cervical lymphadenopathy, as described and likely reactive. No CT evidence of osteomyelitis within the cervical spine. Cervical spondylosis, as described. Mucous retention cysts within the bilateral maxillary sinuses measuring up to 12 mm. Atherosclerotic plaque within the visualized aortic arch, proximal major branch vessels of the neck and carotid arteries. Possible hemodynamically significant stenosis within the proximal ICAs, bilaterally. Consider a non-emergent carotid artery duplex for further evaluation. Electronically Signed   By: KKellie SimmeringD.O.   On: 06/27/2022 10:49    EKG: Independently reviewed.  Sinus rhythm.  Tachycardia.  Assessment/Plan Principal Problem:   Abscess of neck Active Problems:   GERD (gastroesophageal reflux disease)   Hypertension   Diabetes mellitus with complication ([facility])   Drug abuse ([facility])   Hyponatremia  with increased serum osmolality     1.  Carbuncle/abscess in cellulitis of the neck with sepsis present on admission: Sepsis present on admission due to Tachycardia, heart rate more than 130 WBC count 19,000 Source of infection diabetic carbuncle. Blood cultures, drawn Local cultures, swab taken Blood pressure is adequate.  Lactic acid is normal.  2 L isotonic fluid received in the ER. Maintenance IV fluids. Continue vancomycin and Zosyn until clinical improvement, culture reports available. Case discussed with Dr BJanace Hoardby ER physician  and they recommended antibiotic management.  ENT will follow.  2.  Type 2 diabetes, uncontrolled with hyperglycemia: Untreated at home. Last A1c was more than 10 and that was 2 years ago.  Previously on Novolin 70/30 and glipizide but not taken recently.  Blood sugars more than 400 on presentation.  No anion gap. Jeffrey Murphy is agreeable to go back on treatment, Jeffrey Murphy wants to go back on insulin 70/30. Start 70/30 insulin, 25 units twice daily as Jeffrey Murphy was taking in the past.  Keep on sliding scale insulin.  Diabetic educator consult.  We will continue to titrate to achieve good glycemic control and for discharge planning.  3.  Essential hypertension, currently not on medications.  Start lisinopril 10 mg as Jeffrey Murphy was taking in the past.  4.  Drug abuse: Uses cocaine.  Counseled.  Less likely to quit.  5.  Pseudohyponatremia: Sodium 124, corrected for blood sugars more than 132.  No indication for intervention.  Continue isotonic fluid.  Given uncontrolled blood sugars and underlying multiple abscesses, Jeffrey Murphy will need more than 2 midnights for treatment, antibiotic administration, stabilization.  DVT prophylaxis: Lovenox subcu Code Status: None Family Communication: None. Disposition Plan: Home when stable Consults called: ENT Admission status: MedSurg bed.   Barb Merino MD [facility] Pager [phone]

## 2022-06-28 DIAGNOSIS — L0211 Cutaneous abscess of neck: Secondary | ICD-10-CM | POA: Diagnosis not present

## 2022-06-28 LAB — BASIC METABOLIC PANEL
Anion gap: 10 (ref 5–15)
BUN: 10 mg/dL (ref 8–23)
CO2: 30 mmol/L (ref 22–32)
Calcium: 8.7 mg/dL — ABNORMAL LOW (ref 8.9–10.3)
Chloride: 90 mmol/L — ABNORMAL LOW (ref 98–111)
Creatinine, Ser: 0.68 mg/dL (ref 0.61–1.24)
GFR, Estimated: >60 mL/min (ref >60)
Glucose, Bld: 234 mg/dL — ABNORMAL HIGH (ref 70–99)
Potassium: 4.7 mmol/L (ref 3.5–5.1)
Sodium: 130 mmol/L — ABNORMAL LOW (ref 135–145)

## 2022-06-28 LAB — CBC
HCT: 39.4 % (ref 39.0–52.0)
Hemoglobin: 13.3 g/dL (ref 13.0–17.0)
MCH: 28 pg (ref 26.0–34.0)
MCHC: 33.8 g/dL (ref 30.0–36.0)
MCV: 82.9 fL (ref 80.0–100.0)
Platelets: 213 10*3/uL (ref 150–400)
RBC: 4.75 MIL/uL (ref 4.22–5.81)
RDW: 12.9 % (ref 11.5–15.5)
WBC: 17.7 10*3/uL — ABNORMAL HIGH (ref 4.0–10.5)
nRBC: 0 % (ref 0.0–0.2)

## 2022-06-28 LAB — MAGNESIUM: Magnesium: 1.7 mg/dL (ref 1.7–2.4)

## 2022-06-28 LAB — CBG MONITORING, ED
Glucose-Capillary: 247 mg/dL — ABNORMAL HIGH (ref 70–99)
Glucose-Capillary: 342 mg/dL — ABNORMAL HIGH (ref 70–99)

## 2022-06-28 LAB — LIPID PANEL
Cholesterol: 88 mg/dL (ref 0–200)
HDL: 19 mg/dL — ABNORMAL LOW (ref >40)
LDL Cholesterol: 46 mg/dL (ref 0–99)
Total CHOL/HDL Ratio: 4.6 RATIO
Triglycerides: 115 mg/dL (ref <150)
VLDL: 23 mg/dL (ref 0–40)

## 2022-06-28 LAB — GLUCOSE, CAPILLARY
Glucose-Capillary: 254 mg/dL — ABNORMAL HIGH (ref 70–99)
Glucose-Capillary: 318 mg/dL — ABNORMAL HIGH (ref 70–99)
Glucose-Capillary: 156 mg/dL — ABNORMAL HIGH (ref 70–99)

## 2022-06-28 LAB — PHOSPHORUS: Phosphorus: 2.8 mg/dL (ref 2.5–4.6)

## 2022-06-28 MED ORDER — LIVING WELL WITH DIABETES BOOK
Freq: Once | Status: AC
  Filled 2022-06-28 (×2): qty 1

## 2022-06-28 MED ORDER — INSULIN ASPART PROT & ASPART (70-30 MIX) 100 UNIT/ML ~~LOC~~ SUSP
35.0000 [IU] | Freq: Two times a day (BID) | SUBCUTANEOUS | Status: DC
  Administered 2022-06-28 – 2022-06-30 (×6): 35 [IU] via SUBCUTANEOUS
  Filled 2022-06-28: qty 10

## 2022-06-28 NOTE — Progress Notes (Signed)
Patient ID: Jeffrey Murphy, male   DOB: 05/12/1956, 66 y.o.   MRN: [id number]  He states he is much better than when he came in 24 hours ago. He is spontaneously draining from the wound.   Will continue observation for care and the wound drainage should be cultured. Will follow for the need of I/D

## 2022-06-28 NOTE — ED Notes (Signed)
Pts linen changed. Wound still open to air with drainage. Neck red and tender to touch.

## 2022-06-28 NOTE — Progress Notes (Signed)
PROGRESS NOTE    Jeffrey Murphy  WGN:[id number] DOB: August 08, 1956 DOA: 06/27/2022 PCP: Pcp, No    Brief Narrative:  66 year old with uncontrolled type 2 diabetes currently not taking treatment, essential hypertension presented to the emergency room with about 5 days of posterior neck pain and swelling and found to have carbuncle with significant induration and blood sugars more than 400.   Assessment & Plan:   Abscess and cellulitis of the neck/carbuncle and uncontrolled diabetes: Significant spreading cellulitis and developing abscess. Sepsis present on admission, resolved. Blood cultures pending Local cultures pending Continue maintenance IV fluids, continue vancomycin and Zosyn until clinical improvement. ENT closely following for need for any surgical drainage.  Type 2 diabetes, uncontrolled with hyperglycemia.  Untreated.  Hemoglobin A1c is 13. Started on previous home insulin doses 70/30, 35 units twice daily. Keep sliding scale insulin.  We will continue to work on uptitrating insulin doses.  He is interested to go back on medications now.  Essential hypertension: Untreated.  Stable on lisinopril 10 mg daily.  Pseudohyponatremia: Correcting.    DVT prophylaxis: enoxaparin (LOVENOX) injection 40 mg Start: 06/27/22 1745   Code Status: Full code  Family Communication: None none Disposition Plan: Status is: Inpatient Remains inpatient appropriate because: Significant spreading infection of the neck     Consultants:  ENT  Procedures:  None  Antimicrobials:  Vancomycin and Zosyn 8/21---   Subjective: Patient seen and examined.  Still in the emergency room.  Frustrated draining fluid from the neck.  Pain is controlled.  Blood sugars are better.  Happy to see his blood sugars less than 300 today morning.  For  Objective: Vitals:   06/28/22 0537 06/28/22 0820 06/28/22 1012 06/28/22 1123  BP: 107/66 116/65 109/75 (!) 113/91  Pulse: (!) 102 90 (!) 108 100  Resp: 18  19 20 20   Temp: 98.2 F (36.8 C) 98.4 F (36.9 C) 99.1 F (37.3 C) 98.3 F (36.8 C)  TempSrc:   Oral Oral  SpO2: 95% 98% 94% 96%  Weight:    83.1 kg  Height:    5\' 9"  (1.753 m)    Intake/Output Summary (Last 24 hours) at 06/28/2022 1155 Last data filed at 06/28/2022 0557 Gross per 24 hour  Intake 478.02 ml  Output --  Net 478.02 ml   Filed Weights   06/27/22 0754 06/28/22 1123  Weight: 95.3 kg 83.1 kg    Examination:  Physical Exam Constitutional:      Comments: Comfortable.  Eating breakfast.  HENT:     Head: Normocephalic and atraumatic.     Comments: See picture below posterior neck. Eyes:     Pupils: Pupils are equal, round, and reactive to light.  Cardiovascular:     Rate and Rhythm: Normal rate and regular rhythm.  Musculoskeletal:        General: Normal range of motion.  Skin:    General: Skin is warm.  Neurological:     General: No focal deficit present.     Mental Status: He is alert.  Psychiatric:        Mood and Affect: Mood normal.         Data Reviewed: I have personally reviewed following labs and imaging studies  CBC: Recent Labs  Lab 06/27/22 0809 06/28/22 0400  WBC 19.8* 17.7*  NEUTROABS 16.8*  --   HGB 13.7 13.3  HCT 41.4 39.4  MCV 83.3 82.9  PLT 208 213   Basic Metabolic Panel: Recent Labs  Lab 06/27/22 0809 06/28/22 0400  NA 124* 130*  K 4.5 4.7  CL 85* 90*  CO2 24 30  GLUCOSE 478* 234*  BUN 15 10  CREATININE 0.84 0.68  CALCIUM 8.7* 8.7*  MG  --  1.7  PHOS  --  2.8   GFR: Estimated Creatinine Clearance: 94.5 mL/min (by C-G formula based on SCr of 0.68 mg/dL). Liver Function Tests: Recent Labs  Lab 06/27/22 0809  AST 16  ALT 12  ALKPHOS 124  BILITOT 0.7  PROT 7.5  ALBUMIN 2.7*   No results for input(s): "LIPASE", "AMYLASE" in the last 168 hours. No results for input(s): "AMMONIA" in the last 168 hours. Coagulation Profile: No results for input(s): "INR", "PROTIME" in the last 168 hours. Cardiac  Enzymes: No results for input(s): "CKTOTAL", "CKMB", "CKMBINDEX", "TROPONINI" in the last 168 hours. BNP (last 3 results) No results for input(s): "PROBNP" in the last 8760 hours. HbA1C: Recent Labs    06/27/22 0809  HGBA1C 13.4*   CBG: Recent Labs  Lab 06/27/22 0812 06/27/22 1724 06/28/22 0048 06/28/22 0800  GLUCAP 487* 254* 342* 247*   Lipid Profile: Recent Labs    06/28/22 0400  CHOL 88  HDL 19*  LDLCALC 46  TRIG 115  CHOLHDL 4.6   Thyroid Function Tests: No results for input(s): "TSH", "T4TOTAL", "FREET4", "T3FREE", "THYROIDAB" in the last 72 hours. Anemia Panel: No results for input(s): "VITAMINB12", "FOLATE", "FERRITIN", "TIBC", "IRON", "RETICCTPCT" in the last 72 hours. Sepsis Labs: Recent Labs  Lab 06/27/22 0809 06/27/22 1600  LATICACIDVEN 1.8 1.7    Recent Results (from the past 240 hour(s))  Culture, blood (routine x 2)     Status: None (Preliminary result)   Collection Time: 06/27/22  4:28 PM   Specimen: BLOOD  Result Value Ref Range Status   Specimen Description BLOOD SITE NOT SPECIFIED  Final   Special Requests   Final    BOTTLES DRAWN AEROBIC AND ANAEROBIC Blood Culture results may not be optimal due to an excessive volume of blood received in culture bottles   Culture   Final    NO GROWTH < 24 HOURS Performed at [facility], [place] [address] [place], [place] [address]    Report Status PENDING  Incomplete  Culture, blood (routine x 2)     Status: None (Preliminary result)   Collection Time: 06/27/22  5:11 PM   Specimen: BLOOD  Result Value Ref Range Status   Specimen Description BLOOD BLOOD LEFT HAND  Final   Special Requests   Final    BOTTLES DRAWN AEROBIC ONLY Blood Culture results may not be optimal due to an inadequate volume of blood received in culture bottles   Culture   Final    NO GROWTH < 24 HOURS Performed at [facility], [place] [address], [place], [place] [address]    Report Status PENDING  Incomplete          Radiology Studies: CT Soft Tissue Neck W Contrast  Result Date: 06/27/2022 CLINICAL DATA:  Provided history: Soft tissue swelling, infection suspected, neck x-ray done; significant abscess to posterior neck-possible tracking wound, rule out underlying cervical osteomyelitis. EXAM: CT NECK WITH CONTRAST TECHNIQUE: Multidetector CT imaging of the neck was performed using the standard protocol following the bolus administration of intravenous contrast. RADIATION DOSE REDUCTION: This exam was performed according to the departmental dose-optimization program which includes automated exposure control, adjustment of the mA and/or kV according to patient size and/or use of iterative reconstruction technique. CONTRAST:  37mL OMNIPAQUE IOHEXOL  300 MG/ML  SOLN COMPARISON:  Neck CT 11/30/2017. FINDINGS: Pharynx and larynx: Poor dentition with multiple absent, carious and fractured teeth and multifocal periapical lucencies. No appreciable swelling or mass within the oral cavity, pharynx or larynx. Salivary glands: The right parotid gland appears surgically absent. Unremarkable appearance of the left parotid and bilateral submandibular glands. Thyroid: Unremarkable. Lymph nodes: Bilateral cervical lymphadenopathy, most notably as follows. An enlarged right level 5 lymph node measures 13 mm in short axis (series 5, image 29) (series 3, image 60). An enlarged left level 2/5 lymph node measures 13 mm in short axis (series 5, image 80) (series 3, image 52). Vascular: The major vascular structures of the neck are patent. Atherosclerotic plaque within the visualized aortic arch, within the proximal major branch vessels of the neck, about the carotid bifurcations and within the proximal ICAs. Possible hemodynamically significant stenoses within the proximal ICAs. Limited intracranial: No evidence of acute intracranial abnormality within the field of view. Visualized orbits: Incompletely imaged. No orbital mass or acute  orbital finding at the imaged levels. Mastoids and visualized paranasal sinuses: Portions of the frontal sinuses are excluded from the field of view superiorly. 8 mm mucous retention cyst within the right maxillary sinus. 12 mm mucous retention cyst within the left maxillary sinus. No significant mastoid effusion. Skeleton: Cervical spondylosis with multilevel disc space narrowing, disc bulges, endplate spurring and uncovertebral hypertrophy. Multilevel spinal canal stenosis. Most notably, disc bulges contribute to apparent moderate spinal canal stenosis at C3-C4 and C4-C5. Multilevel bony neural foraminal narrowing. Multilevel ventrolateral osteophytes. No acute fracture or aggressive osseous lesion. Upper chest: No consolidation within the imaged lung apices. Other: There is extensive edema, inflammatory stranding and infiltration within the subcutaneous fat of the posterior neck (with overlying skin thickening). Findings are compatible with cellulitis and phlegmon. 4 mm peripherally enhancing focus within the subcutaneous fat of the posterior neck, eccentric to the right at the C5 level (series 5, image 26) (series 3, image 51). This is suspicious for a small abscess. IMPRESSION: Extensive cellulitis and phlegmon within the posterior neck soft tissues. Suspected 4 mm abscess within the subcutaneous fat of the posterior neck, eccentric to the right at the C5 level. Associated bilateral cervical lymphadenopathy, as described and likely reactive. No CT evidence of osteomyelitis within the cervical spine. Cervical spondylosis, as described. Mucous retention cysts within the bilateral maxillary sinuses measuring up to 12 mm. Atherosclerotic plaque within the visualized aortic arch, proximal major branch vessels of the neck and carotid arteries. Possible hemodynamically significant stenosis within the proximal ICAs, bilaterally. Consider a non-emergent carotid artery duplex for further evaluation. Electronically Signed    By: Jackey Loge D.O.   On: 06/27/2022 10:49        Scheduled Meds:  enoxaparin (LOVENOX) injection  40 mg Subcutaneous Q24H   insulin aspart  0-15 Units Subcutaneous TID WC   insulin aspart  0-5 Units Subcutaneous QHS   insulin aspart protamine- aspart  35 Units Subcutaneous BID WC   lisinopril  10 mg Oral Daily   Continuous Infusions:  sodium chloride 100 mL/hr at 06/28/22 1145   piperacillin-tazobactam (ZOSYN)  IV Stopped (06/28/22 1141)   vancomycin Stopped (06/28/22 8676)     LOS: 1 day    Time spent: 35 minutes     Dorcas Carrow, MD [facility] Pager [phone]

## 2022-06-28 NOTE — Inpatient Diabetes Management (Signed)
Inpatient Diabetes Program Recommendations  AACE/ADA: New Consensus Statement on Inpatient Glycemic Control (2015)  Target Ranges:  Prepandial:   less than 140 mg/dL      Peak postprandial:   less than 180 mg/dL (1-2 hours)      Critically ill patients:  140 - 180 mg/dL   Lab Results  Component Value Date   GLUCAP 254 (H) 06/28/2022   HGBA1C 13.4 (H) 06/27/2022    Review of Glycemic Control  Diabetes history: DM2 Outpatient Diabetes medications: Glipizide 10 BID, 70/30 25 BID Current orders for Inpatient glycemic control: 70/30 35 units bid, Novolog 0-15 units tid, 0-5 units hs  Inpatient Diabetes Program Recommendations:   Spoke with pt about A1C results 13.4 (average blood glucose 338 over the past 2-3 months) and explained what an A1C is, basic pathophysiology of DM Type 2, basic home care, basic diabetes diet nutrition principles, importance of checking CBGs and maintaining good CBG control to prevent long-term and short-term complications. Reviewed signs and symptoms of hyperglycemia and hypoglycemia and how to treat hypoglycemia at home. Also reviewed blood sugar goals at home.  RNs to provide ongoing basic DM education at bedside with this patient. Have ordered educational booklet. Patient states "I didn't think my insulin was as important as other things, so I haven't taken in over a month". Patient acknowledges he knows the importance of glucose control and agrees to start taking his insulin although the cost stretches his budget. Patient states he has a lot of bills to pay.  Thank you, Billy Fischer. Tamila Gaulin, RN, MSN, CDE  Diabetes Coordinator Inpatient Glycemic Control Team Team Pager [phone] (8am-5pm) 06/28/2022 1:41 PM

## 2022-06-28 NOTE — Plan of Care (Signed)

## 2022-06-28 NOTE — ED Notes (Signed)
Breakfast order placed ?

## 2022-06-28 NOTE — Consult Note (Signed)
Reason for Consult:neck infection Referring Physician: Dr Casimer Leek is an 66 y.o. male.  HPI: hx of 5 days of swelling and pain in the back of the neck. It has increased in size and pain. He is here for evaluation and had CT scan that showed large area of swelling but only a very small area of possible abscess.   Past Medical History:  Diagnosis Date   Abscess 12/2016   LEFT INGUINAL ABSCESS   Arthritis    Diabetes mellitus without complication (HCC)    Hypertension    Wears glasses    reading    Past Surgical History:  Procedure Laterality Date   COLONOSCOPY     CYST REMOVAL NECK     INGUINAL HERNIA REPAIR     right   IRRIGATION AND DEBRIDEMENT ABSCESS Left 12/12/2016   Procedure: IRRIGATION AND DEBRIDEMENT ABSCESS;  Surgeon: Almond Lint, MD;  Location: [facility];  Service: General;  Laterality: Left;   JOINT REPLACEMENT     right total knee   PILONIDAL CYST EXCISION     back   QUADRICEPS TENDON REPAIR Right 12/24/2014   Procedure: RIGHT QUADRICEP TENDON REPAIR ;  Surgeon: Cheral Almas, MD;  Location: [facility];  Service: Orthopedics;  Laterality: Right;   REPLACEMENT TOTAL KNEE      Family History  Problem Relation Age of Onset   Cancer Mother    Hypertension Mother    Diabetes Father     Social History:  reports that he has never smoked. He has never used smokeless tobacco. He reports that he does not currently use alcohol. He reports current drug use. Frequency: 5.00 times per week. Drugs: Marijuana, Cocaine, and "Crack" cocaine.  Allergies: No Known Allergies  Medications: I have reviewed the patient's current medications.  Results for orders placed or performed during the hospital encounter of 06/27/22 (from the past 48 hour(s))  Lactic acid, plasma     Status: None   Collection Time: 06/27/22  8:09 AM  Result Value Ref Range   Lactic Acid, Venous 1.8 0.5 - 1.9 mmol/L    Comment: Performed at [facility], [address], [place], [place] [address]  Comprehensive metabolic panel     Status: Abnormal   Collection Time: 06/27/22  8:09 AM  Result Value Ref Range   Sodium 124 (L) 135 - 145 mmol/L   Potassium 4.5 3.5 - 5.1 mmol/L   Chloride 85 (L) 98 - 111 mmol/L   CO2 24 22 - 32 mmol/L   Glucose, Bld 478 (H) 70 - 99 mg/dL    Comment: Glucose reference range applies only to samples taken after fasting for at least 8 hours.   BUN 15 8 - 23 mg/dL   Creatinine, Ser 6.04 0.61 - 1.24 mg/dL   Calcium 8.7 (L) 8.9 - 10.3 mg/dL   Total Protein 7.5 6.5 - 8.1 g/dL   Albumin 2.7 (L) 3.5 - 5.0 g/dL   AST 16 15 - 41 U/L   ALT 12 0 - 44 U/L   Alkaline Phosphatase 124 38 - 126 U/L   Total Bilirubin 0.7 0.3 - 1.2 mg/dL   GFR, Estimated >54 >09 mL/min    Comment: (NOTE) Calculated using the CKD-EPI Creatinine Equation (2021)    Anion gap 15 5 - 15    Comment: Performed at [facility], [address], [place], [place] [address]  CBC with Differential     Status: Abnormal  Collection Time: 06/27/22  8:09 AM  Result Value Ref Range   WBC 19.8 (H) 4.0 - 10.5 K/uL   RBC 4.97 4.22 - 5.81 MIL/uL   Hemoglobin 13.7 13.0 - 17.0 g/dL   HCT 78.241.4 95.639.0 - 21.352.0 %   MCV 83.3 80.0 - 100.0 fL   MCH 27.6 26.0 - 34.0 pg   MCHC 33.1 30.0 - 36.0 g/dL   RDW 08.612.9 57.811.5 - 46.915.5 %   Platelets 208 150 - 400 K/uL   nRBC 0.0 0.0 - 0.2 %   Neutrophils Relative % 84 %   Neutro Abs 16.8 (H) 1.7 - 7.7 K/uL   Lymphocytes Relative 7 %   Lymphs Abs 1.4 0.7 - 4.0 K/uL   Monocytes Relative 6 %   Monocytes Absolute 1.1 (H) 0.1 - 1.0 K/uL   Eosinophils Relative 1 %   Eosinophils Absolute 0.2 0.0 - 0.5 K/uL   Basophils Relative 1 %   Basophils Absolute 0.1 0.0 - 0.1 K/uL   Immature Granulocytes 1 %   Abs Immature Granulocytes 0.14 (H) 0.00 - 0.07 K/uL    Comment: Performed at [facility], [address] [place], [place] [address]  Hemoglobin A1c     Status: Abnormal   Collection Time: 06/27/22  8:09 AM  Result Value Ref Range    Hgb A1c MFr Bld 13.4 (H) 4.8 - 5.6 %    Comment: (NOTE) Pre diabetes:          5.7%-6.4%  Diabetes:              >6.4%  Glycemic control for   <7.0% adults with diabetes    Mean Plasma Glucose 337.88 mg/dL    Comment: Performed at [facility], [address] [place], [place] [address]  POC CBG, ED     Status: Abnormal   Collection Time: 06/27/22  8:12 AM  Result Value Ref Range   Glucose-Capillary 487 (H) 70 - 99 mg/dL    Comment: Glucose reference range applies only to samples taken after fasting for at least 8 hours.   Comment 1 Call MD NNP PA CNM   Lactic acid, plasma     Status: None   Collection Time: 06/27/22  4:00 PM  Result Value Ref Range   Lactic Acid, Venous 1.7 0.5 - 1.9 mmol/L    Comment: Performed at [facility], [address] [place], [place] [address]  CBG monitoring, ED     Status: Abnormal   Collection Time: 06/27/22  5:24 PM  Result Value Ref Range   Glucose-Capillary 254 (H) 70 - 99 mg/dL    Comment: Glucose reference range applies only to samples taken after fasting for at least 8 hours.  HIV Antibody (routine testing w rflx)     Status: None   Collection Time: 06/27/22  7:30 PM  Result Value Ref Range   HIV Screen 4th Generation wRfx Non Reactive Non Reactive    Comment: Performed at [facility], [address] [place], [place] [address]  CBG monitoring, ED     Status: Abnormal   Collection Time: 06/28/22 12:48 AM  Result Value Ref Range   Glucose-Capillary 342 (H) 70 - 99 mg/dL    Comment: Glucose reference range applies only to samples taken after fasting for at least 8 hours.  Basic metabolic panel     Status: Abnormal   Collection Time: 06/28/22  4:00 AM  Result Value Ref Range   Sodium 130 (  L) 135 - 145 mmol/L   Potassium 4.7 3.5 - 5.1 mmol/L   Chloride 90 (L) 98 - 111 mmol/L   CO2 30 22 - 32 mmol/L   Glucose, Bld 234 (H) 70 - 99 mg/dL    Comment: Glucose reference range applies only to samples taken after fasting for at  least 8 hours.   BUN 10 8 - 23 mg/dL   Creatinine, Ser 9.70 0.61 - 1.24 mg/dL   Calcium 8.7 (L) 8.9 - 10.3 mg/dL   GFR, Estimated >26 >37 mL/min    Comment: (NOTE) Calculated using the CKD-EPI Creatinine Equation (2021)    Anion gap 10 5 - 15    Comment: Performed at [facility], [address] [place], [place] [address]  CBC     Status: Abnormal   Collection Time: 06/28/22  4:00 AM  Result Value Ref Range   WBC 17.7 (H) 4.0 - 10.5 K/uL   RBC 4.75 4.22 - 5.81 MIL/uL   Hemoglobin 13.3 13.0 - 17.0 g/dL   HCT 02.7 74.1 - 28.7 %   MCV 82.9 80.0 - 100.0 fL   MCH 28.0 26.0 - 34.0 pg   MCHC 33.8 30.0 - 36.0 g/dL   RDW 86.7 67.2 - 09.4 %   Platelets 213 150 - 400 K/uL   nRBC 0.0 0.0 - 0.2 %    Comment: Performed at [facility], [address] [place], [place] [address]  Magnesium     Status: None   Collection Time: 06/28/22  4:00 AM  Result Value Ref Range   Magnesium 1.7 1.7 - 2.4 mg/dL    Comment: Performed at [facility], [address] [place], [place] [address]  Phosphorus     Status: None   Collection Time: 06/28/22  4:00 AM  Result Value Ref Range   Phosphorus 2.8 2.5 - 4.6 mg/dL    Comment: Performed at [facility], [address] [place], [place] [address]  Lipid panel     Status: Abnormal   Collection Time: 06/28/22  4:00 AM  Result Value Ref Range   Cholesterol 88 0 - 200 mg/dL   Triglycerides 465 <035 mg/dL   HDL 19 (L) >46 mg/dL   Total CHOL/HDL Ratio 4.6 RATIO   VLDL 23 0 - 40 mg/dL   LDL Cholesterol 46 0 - 99 mg/dL    Comment:        Total Cholesterol/HDL:CHD Risk Coronary Heart Disease Risk Table                     Men   Women  1/2 Average Risk   3.4   3.3  Average Risk       5.0   4.4  2 X Average Risk   9.6   7.1  3 X Average Risk  23.4   11.0        Use the calculated Patient Ratio above and the CHD Risk Table to determine the patient's CHD Risk.        ATP III CLASSIFICATION (LDL):  <100     mg/dL   Optimal   568-127  mg/dL   Near or Above                    Optimal  130-159  mg/dL   Borderline  517-001  mg/dL   High  >749     mg/dL   Very High Performed at [facility], [address], [place], [place] [address]     CT Soft Tissue Neck W Contrast  Result Date: 06/27/2022 CLINICAL DATA:  Provided history: Soft tissue swelling, infection suspected, neck x-ray done; significant abscess to posterior neck-possible tracking wound, rule out underlying cervical osteomyelitis. EXAM: CT NECK WITH CONTRAST TECHNIQUE: Multidetector CT imaging of the neck was performed using the standard protocol following the bolus administration of intravenous contrast. RADIATION DOSE REDUCTION: This exam was performed according to the departmental dose-optimization program which includes automated exposure control, adjustment of the mA and/or kV according to patient size and/or use of iterative reconstruction technique. CONTRAST:  68mL OMNIPAQUE IOHEXOL 300 MG/ML  SOLN COMPARISON:  Neck CT 11/30/2017. FINDINGS: Pharynx and larynx: Poor dentition with multiple absent, carious and fractured teeth and multifocal periapical lucencies. No appreciable swelling or mass within the oral cavity, pharynx or larynx. Salivary glands: The right parotid gland appears surgically absent. Unremarkable appearance of the left parotid and bilateral submandibular glands. Thyroid: Unremarkable. Lymph nodes: Bilateral cervical lymphadenopathy, most notably as follows. An enlarged right level 5 lymph node measures 13 mm in short axis (series 5, image 29) (series 3, image 60). An enlarged left level 2/5 lymph node measures 13 mm in short axis (series 5, image 80) (series 3, image 52). Vascular: The major vascular structures of the neck are patent. Atherosclerotic plaque within the visualized aortic arch, within the proximal major branch vessels of the neck, about the carotid bifurcations and within the proximal ICAs. Possible hemodynamically significant  stenoses within the proximal ICAs. Limited intracranial: No evidence of acute intracranial abnormality within the field of view. Visualized orbits: Incompletely imaged. No orbital mass or acute orbital finding at the imaged levels. Mastoids and visualized paranasal sinuses: Portions of the frontal sinuses are excluded from the field of view superiorly. 8 mm mucous retention cyst within the right maxillary sinus. 12 mm mucous retention cyst within the left maxillary sinus. No significant mastoid effusion. Skeleton: Cervical spondylosis with multilevel disc space narrowing, disc bulges, endplate spurring and uncovertebral hypertrophy. Multilevel spinal canal stenosis. Most notably, disc bulges contribute to apparent moderate spinal canal stenosis at C3-C4 and C4-C5. Multilevel bony neural foraminal narrowing. Multilevel ventrolateral osteophytes. No acute fracture or aggressive osseous lesion. Upper chest: No consolidation within the imaged lung apices. Other: There is extensive edema, inflammatory stranding and infiltration within the subcutaneous fat of the posterior neck (with overlying skin thickening). Findings are compatible with cellulitis and phlegmon. 4 mm peripherally enhancing focus within the subcutaneous fat of the posterior neck, eccentric to the right at the C5 level (series 5, image 26) (series 3, image 51). This is suspicious for a small abscess. IMPRESSION: Extensive cellulitis and phlegmon within the posterior neck soft tissues. Suspected 4 mm abscess within the subcutaneous fat of the posterior neck, eccentric to the right at the C5 level. Associated bilateral cervical lymphadenopathy, as described and likely reactive. No CT evidence of osteomyelitis within the cervical spine. Cervical spondylosis, as described. Mucous retention cysts within the bilateral maxillary sinuses measuring up to 12 mm. Atherosclerotic plaque within the visualized aortic arch, proximal major branch vessels of the neck and  carotid arteries. Possible hemodynamically significant stenosis within the proximal ICAs, bilaterally. Consider a non-emergent carotid artery duplex for further evaluation. Electronically Signed   By: Jackey Loge D.O.   On: 06/27/2022 10:49    ROS Blood pressure 107/66, pulse (!) 102, temperature 98.2 F (36.8 C), resp. rate 18, height 5\' 9"  (1.753 m), weight 95.3 kg,  SpO2 95 %. Physical Exam HENT:     Right Ear: Tympanic membrane normal.     Left Ear: Tympanic membrane normal.     Nose: Nose normal.     Mouth/Throat:     Mouth: Mucous membranes are moist.  Eyes:     Pupils: Pupils are equal, round, and reactive to light.  Neck:     Comments: Large area in the back of the neck below the nuchal line with redness and induration. Tender and firm to palpation. Neurological:     Mental Status: He is alert.       Assessment/Plan: Neck infection- there my be a small abscess but mostly indurated soft tissue like MRSA infection. Need to see how medical therapy for MRSA works then I/D if not responding.   Suzanna Obey 06/28/2022, 7:15 AM

## 2022-06-28 NOTE — ED Notes (Signed)
Lunch order placed

## 2022-06-28 NOTE — ED Notes (Addendum)
Pt in room resting. NAD chest rising and falling. Pt A&O x4. Pt ambulated with a steady gait to restroom. Breakfast at bedside. Updated on plan of care. Admitting MD at bedside.  Pt given coffee.

## 2022-06-29 ENCOUNTER — Other Ambulatory Visit (HOSPITAL_COMMUNITY): Payer: Self-pay

## 2022-06-29 DIAGNOSIS — L0211 Cutaneous abscess of neck: Secondary | ICD-10-CM | POA: Diagnosis not present

## 2022-06-29 LAB — GLUCOSE, CAPILLARY
Glucose-Capillary: 110 mg/dL — ABNORMAL HIGH (ref 70–99)
Glucose-Capillary: 181 mg/dL — ABNORMAL HIGH (ref 70–99)
Glucose-Capillary: 127 mg/dL — ABNORMAL HIGH (ref 70–99)
Glucose-Capillary: 180 mg/dL — ABNORMAL HIGH (ref 70–99)

## 2022-06-29 MED ORDER — BLOOD GLUCOSE MONITOR KIT
PACK | 0 refills | Status: DC
Start: 2022-06-29 — End: 2022-07-05
  Filled 2022-06-29: qty 1, 30d supply, fill #0

## 2022-06-29 MED ORDER — MUPIROCIN 2 % EX OINT
TOPICAL_OINTMENT | Freq: Three times a day (TID) | CUTANEOUS | Status: DC
  Filled 2022-06-29 (×4): qty 22

## 2022-06-29 MED ORDER — TRUEPLUS LANCETS 28G MISC
0 refills | Status: DC
  Filled 2022-06-29: qty 100, 30d supply, fill #0

## 2022-06-29 MED ORDER — TRUE METRIX BLOOD GLUCOSE TEST VI STRP
ORAL_STRIP | 0 refills | Status: AC
  Filled 2022-06-29: qty 100, 30d supply, fill #0

## 2022-06-29 NOTE — Progress Notes (Signed)
PROGRESS NOTE    Jeffrey Murphy  QQI:[id number] DOB: [birth year] DOA: 06/27/2022 PCP: Pcp, No    Brief Narrative:  66 year old with uncontrolled type 2 diabetes currently not taking treatment, essential hypertension presented to the emergency room with about 5 days of posterior neck pain and swelling and found to have carbuncle with significant induration and blood sugars more than 400.   Assessment & Plan:   Abscess and cellulitis of the neck/carbuncle and uncontrolled diabetes: Significant cellulitis and induration.  Very small discrete abscess. Sepsis present on admission, resolved. Blood cultures pending, active so far. Local cultures , apparently not drawn.  Previous skin cultures with MSSA. continue vancomycin and Zosyn until clinical improvement. ENT closely following for need for any surgical drainage. If further improvement, may discharge home with oral broad-spectrum antibiotics next 24 to 48 hours.  Type 2 diabetes, uncontrolled with hyperglycemia.  Untreated.  Hemoglobin A1c is 13. Started on previous home insulin doses 70/30, 35 units twice daily. Keep sliding scale insulin.  We will continue to work on uptitrating insulin doses.  He is interested to go back on medications now. Will discharge on 70/30 insulin pen.  May also do metformin.  Essential hypertension: Untreated.  Stable on lisinopril 10 mg daily.  Pseudohyponatremia: Correcting.    DVT prophylaxis: enoxaparin (LOVENOX) injection 40 mg Start: 06/27/22 1745   Code Status: Full code  Family Communication: None none Disposition Plan: Status is: Inpatient Remains inpatient appropriate because: Significant spreading infection of the neck     Consultants:  ENT  Procedures:  None  Antimicrobials:  Vancomycin and Zosyn 8/21---   Subjective: Seen and examined.  No overnight events.  Blood sugars better.  He was happy to see his blood sugars less than 100 in many years.  Persistent discomfort on the  back of the neck but much better than before.  He still has a draining wound.  Objective: Vitals:   06/28/22 1012 06/28/22 1123 06/28/22 2007 06/29/22 0747  BP: 109/75 (!) 113/91 127/71 112/75  Pulse: (!) 108 100 (!) 107 92  Resp: 20 20 16 18   Temp: 99.1 F (37.3 C) 98.3 F (36.8 C) 98.2 F (36.8 C) (!) 97.3 F (36.3 C)  TempSrc: Oral Oral Oral Oral  SpO2: 94% 96% 96% 95%  Weight:  83.1 kg    Height:  5\' 9"  (1.753 m)      Intake/Output Summary (Last 24 hours) at 06/29/2022 1034 Last data filed at 06/29/2022 0900 Gross per 24 hour  Intake 598 ml  Output 800 ml  Net -202 ml    Filed Weights   06/27/22 0754 06/28/22 1123  Weight: 95.3 kg 83.1 kg    Examination:  Physical Exam Constitutional:      Comments: Comfortable.  Walking around.  Some discomfort in the neck.  HENT:     Head: Normocephalic and atraumatic.     Comments: Large indurated area with some pustules on the posterior neck.  Picture in the chart. Eyes:     Pupils: Pupils are equal, round, and reactive to light.  Cardiovascular:     Rate and Rhythm: Normal rate and regular rhythm.  Musculoskeletal:        General: Normal range of motion.  Skin:    General: Skin is warm.  Neurological:     General: No focal deficit present.     Mental Status: He is alert.  Psychiatric:        Mood and Affect: Mood normal.  Data Reviewed: I have personally reviewed following labs and imaging studies  CBC: Recent Labs  Lab 06/27/22 0809 06/28/22 0400  WBC 19.8* 17.7*  NEUTROABS 16.8*  --   HGB 13.7 13.3  HCT 41.4 39.4  MCV 83.3 82.9  PLT 208 [id number]    Basic Metabolic Panel: Recent Labs  Lab 06/27/22 0809 06/28/22 0400  NA 124* 130*  K 4.5 4.7  CL 85* 90*  CO2 24 30  GLUCOSE 478* 234*  BUN 15 10  CREATININE 0.84 0.68  CALCIUM 8.7* 8.7*  MG  --  1.7  PHOS  --  2.8    GFR: Estimated Creatinine Clearance: 94.5 mL/min (by C-G formula based on SCr of 0.68 mg/dL). Liver Function  Tests: Recent Labs  Lab 06/27/22 0809  AST 16  ALT 12  ALKPHOS 124  BILITOT 0.7  PROT 7.5  ALBUMIN 2.7*    No results for input(s): "LIPASE", "AMYLASE" in the last 168 hours. No results for input(s): "AMMONIA" in the last 168 hours. Coagulation Profile: No results for input(s): "INR", "PROTIME" in the last 168 hours. Cardiac Enzymes: No results for input(s): "CKTOTAL", "CKMB", "CKMBINDEX", "TROPONINI" in the last 168 hours. BNP (last 3 results) No results for input(s): "PROBNP" in the last 8760 hours. HbA1C: Recent Labs    06/27/22 0809  HGBA1C 13.4*    CBG: Recent Labs  Lab 06/28/22 0800 06/28/22 1135 06/28/22 1623 06/28/22 2200 06/29/22 0742  GLUCAP 247* 254* 318* 156* 110*    Lipid Profile: Recent Labs    06/28/22 0400  CHOL 88  HDL 19*  LDLCALC 46  TRIG 115  CHOLHDL 4.6    Thyroid Function Tests: No results for input(s): "TSH", "T4TOTAL", "FREET4", "T3FREE", "THYROIDAB" in the last 72 hours. Anemia Panel: No results for input(s): "VITAMINB12", "FOLATE", "FERRITIN", "TIBC", "IRON", "RETICCTPCT" in the last 72 hours. Sepsis Labs: Recent Labs  Lab 06/27/22 0809 06/27/22 1600  LATICACIDVEN 1.8 1.7     Recent Results (from the past 240 hour(s))  Culture, blood (routine x 2)     Status: None (Preliminary result)   Collection Time: 06/27/22  4:28 PM   Specimen: BLOOD  Result Value Ref Range Status   Specimen Description BLOOD SITE NOT SPECIFIED  Final   Special Requests   Final    BOTTLES DRAWN AEROBIC AND ANAEROBIC Blood Culture results may not be optimal due to an excessive volume of blood received in culture bottles   Culture   Final    NO GROWTH 2 DAYS Performed at [facility], [place] [address], [place], [place] [address]    Report Status PENDING  Incomplete  Culture, blood (routine x 2)     Status: None (Preliminary result)   Collection Time: 06/27/22  5:11 PM   Specimen: BLOOD  Result Value Ref Range Status   Specimen Description  BLOOD BLOOD LEFT HAND  Final   Special Requests   Final    BOTTLES DRAWN AEROBIC ONLY Blood Culture results may not be optimal due to an inadequate volume of blood received in culture bottles   Culture   Final    NO GROWTH 2 DAYS Performed at [facility], [place] [address], [place], [place] [address]    Report Status PENDING  Incomplete         Radiology Studies: No results found.      Scheduled Meds:  enoxaparin (LOVENOX) injection  40 mg Subcutaneous Q24H   insulin aspart  0-15 Units Subcutaneous TID WC  insulin aspart  0-5 Units Subcutaneous QHS   insulin aspart protamine- aspart  35 Units Subcutaneous BID WC   lisinopril  10 mg Oral Daily   Continuous Infusions:  sodium chloride 100 mL/hr at 06/28/22 1145   piperacillin-tazobactam (ZOSYN)  IV 3.375 g (06/29/22 0839)   vancomycin 1,250 mg (06/29/22 0503)     LOS: 2 days    Time spent: 35 minutes     Dorcas Carrow, MD [facility] Pager [phone]

## 2022-06-29 NOTE — TOC CAGE-AID Note (Signed)
Transition of Care [facility]) - CAGE-AID Screening   Patient Details  Name: Jeffrey Murphy MRN: [id number] Date of Birth: 23-Oct-1956  [facility]) CM/SW Contact:    Janae Bridgeman, RN Phone Number: 06/29/2022, 11:47 AM   Clinical Narrative: CAGE assessment completed since the patient has history of cocaine use - Outpatient substance abuse counseling included in the AVS.   CAGE-AID Screening:    Have You Ever Felt You Ought to Cut Down on Your Drinking or Drug Use?: Yes Have People Annoyed You By Critizing Your Drinking Or Drug Use?: Yes Have You Felt Bad Or Guilty About Your Drinking Or Drug Use?: Yes Have You Ever Had a Drink or Used Drugs First Thing In The Morning to Steady Your Nerves or to Get Rid of a Hangover?: No CAGE-AID Score: 3  Substance Abuse Education Offered: Yes  Substance abuse interventions: Patient Counseling (Outpatient resources provided in the AVS)

## 2022-06-29 NOTE — Plan of Care (Signed)

## 2022-06-29 NOTE — Progress Notes (Addendum)
Cultures positive for oxacillin sensitive S.aureus - continue Zosyn.  Change to PO clindamycin could be considered once discharged.  I cleaned wound with iodine swabs.  Wrote order for TID cleaning and Bactroban ointment.  No surgical issues - draining diffusely.   Subjective: Doing better  Objective: Vital signs in last 24 hours: Temp:  [97.3 F (36.3 C)-98.3 F (36.8 C)] 97.3 F (36.3 C) (08/23 0747) Pulse Rate:  [92-107] 92 (08/23 0747) Resp:  [16-20] 18 (08/23 0747) BP: (112-127)/(71-91) 112/75 (08/23 0747) SpO2:  [95 %-96 %] 95 % (08/23 0747) Weight:  [83.1 kg] 83.1 kg (08/22 1123) Wt Readings from Last 1 Encounters:  06/28/22 83.1 kg    Intake/Output from previous day: 08/22 0701 - 08/23 0700 In: 358 [P.O.:358] Out: 500 [Urine:500] Intake/Output this shift: Total I/O In: 240 [P.O.:240] Out: 300 [Urine:300]  Actively draining neck wound Improving  Recent Labs    06/27/22 0809 06/28/22 0400  WBC 19.8* 17.7*  HGB 13.7 13.3  HCT 41.4 39.4  PLT 208 213    Recent Labs    06/27/22 0809 06/28/22 0400  NA 124* 130*  K 4.5 4.7  CL 85* 90*  CO2 24 30  GLUCOSE 478* 234*  BUN 15 10  CREATININE 0.84 0.68  CALCIUM 8.7* 8.7*    Medications: I have reviewed the patient's current medications.  Assessment/Plan:  See above   LOS: 2 days   Trixie Dredge. 06/29/2022, 11:01 AM

## 2022-06-29 NOTE — Inpatient Diabetes Management (Signed)
Inpatient Diabetes Program Recommendations  AACE/ADA: New Consensus Statement on Inpatient Glycemic Control (2015)  Target Ranges:  Prepandial:   less than 140 mg/dL      Peak postprandial:   less than 180 mg/dL (1-2 hours)      Critically ill patients:  140 - 180 mg/dL   Lab Results  Component Value Date   GLUCAP 110 (H) 06/29/2022   HGBA1C 13.4 (H) 06/27/2022   Received request from Dr. Jerral Ralph to assist to determine cheapest insulin regimen for patient.  Novolog 70/30 flexpen is cheapest @ $35 for 5 pens. Order # [id number]  Thank you, Billy Fischer. Jameon Deller, RN, MSN, CDE  Diabetes Coordinator Inpatient Glycemic Control Team Team Pager [phone] (8am-5pm) 06/29/2022 10:32 AM

## 2022-06-29 NOTE — TOC Initial Note (Signed)
Transition of Care [facility]) - Initial/Assessment Note    Patient Details  Name: Jeffrey Murphy MRN: [id number] Date of Birth: Aug 19, 1956  Transition of Care [facility]) CM/SW Contact:    Curlene Labrum, RN Phone Number: 06/29/2022, 11:41 AM  Clinical Narrative:                 CM met with the patient at the bedside to discuss TOC needs for home.  The patient has dressing on back on neck at this time.  The patient states that he lives at home with his wife and had a neighbor bring him to the hospital.  The patient states that he has a Metrix glucometer at home but has run out of test strips and lancets.  Discharge medications to be provided through the [place] prior to discharge to home.  PCP - The patient states that he currently sees Dr. Glory Buff at [facility].  Remind placed in the discharge instructions and patient plans to schedule an appointment for hospital follow up.  CAGE - The patient admits to occasionally using illicit drug including cocaine but is currently not actively using.  The patient was provided with substance abuse outpatient counseling in the instructions for follow up and patient is aware.  Smoking cessation was also included in the discharge summary as well.  The patient plans to have a friend provide transportation to the home since his wife is disabled and does not drive.  CM will continue to follow the patient for discharge planning to home - pending discharge date.    Expected Discharge Plan: Home/Self Care Barriers to Discharge: Continued Medical Work up   Patient Goals and CMS Choice Patient states their goals for this hospitalization and ongoing recovery are:: To return home CMS Medicare.gov Compare Post Acute Care list provided to:: Patient Choice offered to / list presented to : Patient  Expected Discharge Plan and Services Expected Discharge Plan: Home/Self Care In-house Referral: PCP / [facility] Discharge Planning Services: CM  Consult Post Acute Care Choice: Durable Medical Equipment (Has Metrix glucometer at the home but needs test strips and lancets for home.) Living arrangements for the past 2 months: Single Family Home                                      Prior Living Arrangements/Services Living arrangements for the past 2 months: Single Family Home Lives with:: Spouse Patient language and need for interpreter reviewed:: Yes Do you feel safe going back to the place where you live?: Yes      Need for Family Participation in Patient Care: Yes (Comment) Care giver support system in place?: Yes (comment) Current home services: DME ([employer] Glucometer) Criminal Activity/Legal Involvement Pertinent to Current Situation/Hospitalization: No - Comment as needed  Activities of Daily Living Home Assistive Devices/Equipment: None ADL Screening (condition at time of admission) Patient's cognitive ability adequate to safely complete daily activities?: Yes Is the patient deaf or have difficulty hearing?: No Does the patient have difficulty seeing, even when wearing glasses/contacts?: No Does the patient have difficulty concentrating, remembering, or making decisions?: No Patient able to express need for assistance with ADLs?: Yes Does the patient have difficulty dressing or bathing?: No Independently performs ADLs?: Yes (appropriate for developmental age) Does the patient have difficulty walking or climbing stairs?: No Weakness of Legs: None Weakness of Arms/Hands: None  Permission Sought/Granted Permission sought to share  information with : Case Manager, Family Supports, PCP       Permission granted to share info w AGENCY: [place] granted to share info w Relationship: wife - Blase Mess     Emotional Assessment Appearance:: Appears stated age Attitude/Demeanor/Rapport: Gracious Affect (typically observed): Accepting Orientation: : Oriented to Self, Oriented to Place, Oriented to   Time, Oriented to Situation Alcohol / Substance Use: Illicit Drugs, Tobacco Use Psych Involvement: No (comment)  Admission diagnosis:  Abscess of neck [L02.11] Patient Active Problem List   Diagnosis Date Noted   Abscess of neck 06/27/2022   Hyponatremia with increased serum osmolality 06/27/2022   Inguinal abscess 12/12/2016   Diabetes mellitus with complication ([place]) 81/19/1478   Sepsis ([place]) 12/12/2016   Drug abuse ([place])    Hyperglycemia    GERD (gastroesophageal reflux disease) 04/19/2016   Hypertension 04/19/2016   Anxiety 04/19/2016   Rupture of right quadriceps tendon 12/24/2014   Quadriceps tendon rupture 12/24/2014   PCP:  Pcp, No Pharmacy:   [facility] [address], [place] [address] Phone: [phone] Fax: [place] [place] [address] [place] ([place]) [place] [address] Phone: [phone] Fax: [phone]  [facility] [address] [address] Phone: [phone] Fax: [phone]     Social Determinants of Health (SDOH) Interventions    Readmission Risk Interventions    06/29/2022   11:40 AM  Readmission Risk Prevention Plan  Transportation Screening Complete  PCP or Specialist Appt within 5-7 Days Complete  Home Care Screening Complete  Medication Review (RN CM) Complete

## 2022-06-30 DIAGNOSIS — L739 Follicular disorder, unspecified: Secondary | ICD-10-CM

## 2022-06-30 DIAGNOSIS — L0211 Cutaneous abscess of neck: Secondary | ICD-10-CM | POA: Diagnosis not present

## 2022-06-30 LAB — GLUCOSE, CAPILLARY
Glucose-Capillary: 149 mg/dL — ABNORMAL HIGH (ref 70–99)
Glucose-Capillary: 194 mg/dL — ABNORMAL HIGH (ref 70–99)
Glucose-Capillary: 195 mg/dL — ABNORMAL HIGH (ref 70–99)

## 2022-06-30 LAB — BASIC METABOLIC PANEL
Anion gap: 10 (ref 5–15)
BUN: 9 mg/dL (ref 8–23)
CO2: 26 mmol/L (ref 22–32)
Calcium: 8.7 mg/dL — ABNORMAL LOW (ref 8.9–10.3)
Chloride: 95 mmol/L — ABNORMAL LOW (ref 98–111)
Creatinine, Ser: 0.47 mg/dL — ABNORMAL LOW (ref 0.61–1.24)
GFR, Estimated: >60 mL/min (ref >60)
Glucose, Bld: 119 mg/dL — ABNORMAL HIGH (ref 70–99)
Potassium: 3.7 mmol/L (ref 3.5–5.1)
Sodium: 131 mmol/L — ABNORMAL LOW (ref 135–145)

## 2022-06-30 NOTE — Progress Notes (Signed)
Pharmacy Antibiotic Note - Follow-Up  Jeffrey Murphy is a 66 y.o. male admitted on 06/27/2022 with  cellulitis/abscess .  Pharmacy has been consulted for vancomycin and zosyn dosing.  Today is abx day #4.  Wound improving.  Blood cx negative.  Glycemic controlled significantly improved on insulin.    Plan: Continue vancomycin 1250mg  IV every 12 hours (predicted AUC 476; using Vd 0.5L/kg).  Goal AUC 400-550 mcg/ml*hr Check vancomycin levels as needed  Zosyn 3.375g IV q8h (4 hour infusion). Monitor culture data, CBC, clinical progress   Height: 5\' 9"  (175.3 cm) Weight: 82.5 kg (181 lb 14.1 oz) IBW/kg (Calculated) : 70.7  Temp (24hrs), Avg:98.5 F (36.9 C), Min:97.9 F (36.6 C), Max:99.1 F (37.3 C)  Recent Labs  Lab 06/27/22 0809 06/27/22 1600 06/28/22 0400 06/30/22 0258  WBC 19.8*  --  17.7*  --   CREATININE 0.84  --  0.68 0.47*  LATICACIDVEN 1.8 1.7  --   --      Estimated Creatinine Clearance: 94.5 mL/min (A) (by C-G formula based on SCr of 0.47 mg/dL (L)).    No Known Allergies  Antimicrobials this admission: Vancomycin  8/21 >>  Zosyn 8/21 >>   Dose adjustments this admission:   Microbiology results: 8/21 BCx: ngtd   Thank you for allowing pharmacy to be a part of this patient's care.  9/21, Pharm.D., BCPS Clinical Pharmacist Clinical phone for 06/30/2022 from 7:30-3:00 is [phone].  **Pharmacist phone directory can be found on [email or web address] listed under [facility].  06/30/2022 11:44 AM

## 2022-06-30 NOTE — Progress Notes (Signed)
PROGRESS NOTE Jeffrey Murphy  BSW:[id number] DOB: Apr 17, 1956 DOA: 06/27/2022 PCP: Pcp, No   Brief Narrative/Hospital Course: 66 year old with uncontrolled type 2 diabetes currently not taking treatment, essential hypertension presented to the ED  W/ about 5 days of posterior neck pain and swelling and found to have carbuncle with significant induration and blood sugars more than 400> being managed for sepsis POA due to abscess and cellulitis of the neck/severe folliculitis with uncontrolled diabetes.    Subjective: Seen and examined this morning he is on the bedside chair complains of excruciating pain last night does not feel ready for home Overnight afebrile blood sugar in 120s to 180s Blood sugar in 140s, sodium 131  Assessment and Plan: Principal Problem:   Abscess of neck Active Problems:   GERD (gastroesophageal reflux disease)   Hypertension   Diabetes mellitus with complication (HCC)   Drug abuse (HCC)   Hyponatremia with increased serum osmolality   Folliculitis  Severe folliculitis Sepsis POA due to above Abscess and cellulitis of the neck: Patient with a skin infection in the setting of uncontrolled hyperglycemia.  ENT following closely, cleaning this morning.  Better daily and Bactroban applied, dressing changed, currently no need for acute surgery.  Continue IV antibiotics.  Blood culture no growth so far.  Local wound cultures not drawn previously skin culture showed MSSA.  Continue vancomycin and Zosyn empirically. Recent Labs  Lab 06/27/22 0809 06/27/22 1600 06/28/22 0400  WBC 19.8*  --  17.7*  LATICACIDVEN 1.8 1.7  --     Type 2 diabetes mellitus with uncontrolled hyperglycemia untreated hemoglobin A1c 13. Blood sugar stabilizing.  Previously on insulin 70/30, 35 units twice a day continue SSI, continue 70/30 at 35 units twice daily, DM coordinator following.  Counseling provided and patient agreeable to continue insulin on discharge Recent Labs  Lab  06/27/22 0809 06/27/22 0812 06/29/22 0742 06/29/22 1209 06/29/22 1542 06/29/22 2205 06/30/22 0753  GLUCAP  --    < > 110* 181* 127* 180* 149*  HGBA1C 13.4*  --   --   --   --   --   --    < > = values in this interval not displayed.    Essential hypertension: Untreated, continue current lisinopril. Pseudohyponatremia in the setting of hyperglycemia.  DVT prophylaxis: enoxaparin (LOVENOX) injection 40 mg Start: 06/27/22 1745 Code Status:   Code Status: Full Code Family Communication: plan of care discussed with patient at bedside. Patient status is: Inpatient because of ongoing management of his infection Level of care: Med-Surg   Dispo: The patient is from: Home            Anticipated disposition: Home in 24 to 48 hours once okay with ENT and patient clinically improves  Mobility Assessment (last 72 hours)     Mobility Assessment     Row Name 06/30/22 [phone] 06/29/22 0746 06/28/22 1128       Does patient have an order for bedrest or is patient medically unstable No - Continue assessment No - Continue assessment No - Continue assessment     What is the highest level of mobility based on the progressive mobility assessment? Level 6 (Walks independently in room and hall) - Balance while walking in room without assist - Complete Level 6 (Walks independently in room and hall) - Balance while walking in room without assist - Complete Level 5 (Walks with assist in room/hall) - Balance while stepping forward/back and can walk in room with assist - Complete  Objective: Vitals last 24 hrs: Vitals:   06/29/22 0747 06/29/22 2009 06/30/22 0500 06/30/22 0755  BP: 112/75 112/75  127/87  Pulse: 92 (!) 101  81  Resp: 18 18  15   Temp: (!) 97.3 F (36.3 C) 99.1 F (37.3 C)  97.9 F (36.6 C)  TempSrc: Oral Oral  Oral  SpO2: 95% 96%  99%  Weight:   82.5 kg   Height:       Weight change: -0.6 kg  Physical Examination: General exam: alert awake,older than stated age,  weak appearing. HEENT:Oral mucosa moist, Ear/Nose WNL grossly, dentition normal.  Left neck with skin infection site with dressing in place-tenderness +, Respiratory system: bilaterally clear BS, no use of accessory muscle Cardiovascular system: S1 & S2 +, No JVD. Gastrointestinal system: Abdomen soft,NT,ND, BS+ Nervous System:Alert, awake, moving extremities and grossly nonfocal Extremities: LE edema neg,distal peripheral pulses palpable.  Skin: No rashes,no icterus. MSK: Normal muscle bulk,tone, power  Medications reviewed:  Scheduled Meds:  enoxaparin (LOVENOX) injection  40 mg Subcutaneous Q24H   insulin aspart  0-15 Units Subcutaneous TID WC   insulin aspart  0-5 Units Subcutaneous QHS   insulin aspart protamine- aspart  35 Units Subcutaneous BID WC   lisinopril  10 mg Oral Daily   mupirocin ointment   Topical TID   Continuous Infusions:  sodium chloride 100 mL/hr at 06/28/22 1145   piperacillin-tazobactam (ZOSYN)  IV 3.375 g (06/30/22 0157)   vancomycin 1,250 mg (06/30/22 07/02/22)      Diet Order             Diet Carb Modified Fluid consistency: Thin; Room service appropriate? Yes  Diet effective now                  No intake or output data in the 24 hours ending 06/30/22 0923  Net IO Since Admission: 276.02 mL [06/30/22 0923]  Wt Readings from Last 3 Encounters:  06/30/22 82.5 kg  05/29/19 95.7 kg  04/15/19 96.9 kg     Unresulted Labs (From admission, onward)     Start     Ordered   07/01/22 0500  Basic metabolic panel  Daily at 5am,   R      06/30/22 0831   07/01/22 0500  CBC  Daily at 5am,   R      06/30/22 0831   06/27/22 0758  Aerobic Culture w Gram Stain (superficial specimen)  Once,   URGENT        06/27/22 0757   06/27/22 0757  Urinalysis, Routine w reflex microscopic  Once,   URGENT        06/27/22 0757          Data Reviewed: I have personally reviewed following labs and imaging studies CBC: Recent Labs  Lab 06/27/22 0809 06/28/22 0400   WBC 19.8* 17.7*  NEUTROABS 16.8*  --   HGB 13.7 13.3  HCT 41.4 39.4  MCV 83.3 82.9  PLT 208 213   Basic Metabolic Panel: Recent Labs  Lab 06/27/22 0809 06/28/22 0400 06/30/22 0258  NA 124* 130* 131*  K 4.5 4.7 3.7  CL 85* 90* 95*  CO2 24 30 26   GLUCOSE 478* 234* 119*  BUN 15 10 9   CREATININE 0.84 0.68 0.47*  CALCIUM 8.7* 8.7* 8.7*  MG  --  1.7  --   PHOS  --  2.8  --    GFR: Estimated Creatinine Clearance: 94.5 mL/min (A) (by C-G formula based on SCr of  0.47 mg/dL (L)). Liver Function Tests: Recent Labs  Lab 06/27/22 0809  AST 16  ALT 12  ALKPHOS 124  BILITOT 0.7  PROT 7.5  ALBUMIN 2.7*   No results for input(s): "LIPASE", "AMYLASE" in the last 168 hours. No results for input(s): "AMMONIA" in the last 168 hours. Coagulation Profile: No results for input(s): "INR", "PROTIME" in the last 168 hours. BNP (last 3 results) No results for input(s): "PROBNP" in the last 8760 hours. HbA1C: No results for input(s): "HGBA1C" in the last 72 hours.  CBG: Recent Labs  Lab 06/29/22 0742 06/29/22 1209 06/29/22 1542 06/29/22 2205 06/30/22 0753  GLUCAP 110* 181* 127* 180* 149*   Lipid Profile: Recent Labs    06/28/22 0400  CHOL 88  HDL 19*  LDLCALC 46  TRIG 703  CHOLHDL 4.6   Thyroid Function Tests: No results for input(s): "TSH", "T4TOTAL", "FREET4", "T3FREE", "THYROIDAB" in the last 72 hours. Sepsis Labs: Recent Labs  Lab 06/27/22 0809 06/27/22 1600  LATICACIDVEN 1.8 1.7    Recent Results (from the past 240 hour(s))  Culture, blood (routine x 2)     Status: None (Preliminary result)   Collection Time: 06/27/22  4:28 PM   Specimen: BLOOD  Result Value Ref Range Status   Specimen Description BLOOD SITE NOT SPECIFIED  Final   Special Requests   Final    BOTTLES DRAWN AEROBIC AND ANAEROBIC Blood Culture results may not be optimal due to an excessive volume of blood received in culture bottles   Culture   Final    NO GROWTH 3 DAYS Performed at [facility], [address], [place], [place] [address]    Report Status PENDING  Incomplete  Culture, blood (routine x 2)     Status: None (Preliminary result)   Collection Time: 06/27/22  5:11 PM   Specimen: BLOOD  Result Value Ref Range Status   Specimen Description BLOOD BLOOD LEFT HAND  Final   Special Requests   Final    BOTTLES DRAWN AEROBIC ONLY Blood Culture results may not be optimal due to an inadequate volume of blood received in culture bottles   Culture   Final    NO GROWTH 3 DAYS Performed at [facility], [address] [place], [place] [address]    Report Status PENDING  Incomplete    Antimicrobials: Anti-infectives (From admission, onward)    Start     Dose/Rate Route Frequency Ordered Stop   06/28/22 0500  vancomycin (VANCOREADY) IVPB 1250 mg/250 mL       See Hyperspace for full Linked Orders Report.   1,250 mg 166.7 mL/hr over 90 Minutes Intravenous Every 12 hours 06/27/22 1609     06/28/22 0100  piperacillin-tazobactam (ZOSYN) IVPB 3.375 g       See Hyperspace for full Linked Orders Report.   3.375 g 12.5 mL/hr over 240 Minutes Intravenous Every 8 hours 06/27/22 1609     06/27/22 1615  vancomycin (VANCOREADY) IVPB 2000 mg/400 mL       See Hyperspace for full Linked Orders Report.   2,000 mg 200 mL/hr over 120 Minutes Intravenous  Once 06/27/22 1609 06/27/22 2006   06/27/22 1615  piperacillin-tazobactam (ZOSYN) IVPB 3.375 g       See Hyperspace for full Linked Orders Report.   3.375 g 100 mL/hr over 30 Minutes Intravenous  Once 06/27/22 1609 06/27/22 1729      Culture/Microbiology    Component Value Date/Time   SDES BLOOD BLOOD LEFT  HAND 06/27/2022 1711   SPECREQUEST  06/27/2022 1711    BOTTLES DRAWN AEROBIC ONLY Blood Culture results may not be optimal due to an inadequate volume of blood received in culture bottles   CULT  06/27/2022 1711    NO GROWTH 3 DAYS Performed at [facility], [address], [place], [place] [address]     REPTSTATUS PENDING 06/27/2022 1711    Other culture-see note  Radiology Studies: No results found.   LOS: 3 days   Lanae Boast, MD [facility]  06/30/2022, 9:23 AM

## 2022-06-30 NOTE — Plan of Care (Signed)

## 2022-06-30 NOTE — Progress Notes (Signed)
Subjective:  Complains of pain.  Blood sugars coming down!  Objective: Vital signs in last 24 hours: Temp:  [97.3 F (36.3 C)-99.1 F (37.3 C)] 99.1 F (37.3 C) (08/23 2009) Pulse Rate:  [92-101] 101 (08/23 2009) Resp:  [18] 18 (08/23 2009) BP: (112)/(75) 112/75 (08/23 2009) SpO2:  [95 %-96 %] 96 % (08/23 2009) Weight:  [82.5 kg] 82.5 kg (08/24 0500) Wt Readings from Last 1 Encounters:  06/30/22 82.5 kg    Intake/Output from previous day: 08/23 0701 - 08/24 0700 In: 240 [P.O.:240] Out: 300 [Urine:300] Intake/Output this shift: No intake/output data recorded.  Draining posterior neck/scalp folliculitis - mildly better from yesterday  Recent Labs    06/27/22 0809 06/28/22 0400  WBC 19.8* 17.7*  HGB 13.7 13.3  HCT 41.4 39.4  PLT 208 213    Recent Labs    06/28/22 0400 06/30/22 0258  NA 130* 131*  K 4.7 3.7  CL 90* 95*  CO2 30 26  GLUCOSE 234* 119*  BUN 10 9  CREATININE 0.68 0.47*  CALCIUM 8.7* 8.7*    Medications: Zosyn and Bactroban (Cx sens Staph aureus).  Assessment/Plan:  Severe folliculitis DM - uncontrolled/improving Pain control  Requires ongoing narcotic pain control I cleaned wound this morning with soap, betadine, and applied Bactroban liberally Dressing changed Continued blood sugar control imperative. Here for several more days on ABX per ID. No surgical issues at this time.   LOS: 3 days   Trixie Dredge. 06/30/2022, 6:30 AM

## 2022-06-30 NOTE — Hospital Course (Addendum)
66 year old with uncontrolled type 2 diabetes currently not taking treatment, essential hypertension presented to the ED  W/ about 5 days of posterior neck pain and swelling and found to have carbuncle with significant induration and blood sugars more than 400> being managed for sepsis POA due to abscess and cellulitis of the neck/severe folliculitis with uncontrolled diabetes.  He is following closely at this time no need for surgical debridement.  With IV antibiotics leukocytosis has nicely improved.with ongoing redness and induration despite IV antibiotics, ENT decided for incision drainage 8/26.  ID was also consulted.  Insulin was adjusted.

## 2022-07-01 DIAGNOSIS — L0211 Cutaneous abscess of neck: Secondary | ICD-10-CM | POA: Diagnosis not present

## 2022-07-01 LAB — CBC
HCT: 36.6 % — ABNORMAL LOW (ref 39.0–52.0)
Hemoglobin: 12.4 g/dL — ABNORMAL LOW (ref 13.0–17.0)
MCH: 28.3 pg (ref 26.0–34.0)
MCHC: 33.9 g/dL (ref 30.0–36.0)
MCV: 83.6 fL (ref 80.0–100.0)
Platelets: 258 10*3/uL (ref 150–400)
RBC: 4.38 MIL/uL (ref 4.22–5.81)
RDW: 13 % (ref 11.5–15.5)
WBC: 11.1 10*3/uL — ABNORMAL HIGH (ref 4.0–10.5)
nRBC: 0 % (ref 0.0–0.2)

## 2022-07-01 LAB — BASIC METABOLIC PANEL
Anion gap: 6 (ref 5–15)
BUN: 7 mg/dL — ABNORMAL LOW (ref 8–23)
CO2: 29 mmol/L (ref 22–32)
Calcium: 8.7 mg/dL — ABNORMAL LOW (ref 8.9–10.3)
Chloride: 100 mmol/L (ref 98–111)
Creatinine, Ser: 0.46 mg/dL — ABNORMAL LOW (ref 0.61–1.24)
GFR, Estimated: >60 mL/min (ref >60)
Glucose, Bld: 82 mg/dL (ref 70–99)
Potassium: 4.5 mmol/L (ref 3.5–5.1)
Sodium: 135 mmol/L (ref 135–145)

## 2022-07-01 LAB — GLUCOSE, CAPILLARY
Glucose-Capillary: 168 mg/dL — ABNORMAL HIGH (ref 70–99)
Glucose-Capillary: 126 mg/dL — ABNORMAL HIGH (ref 70–99)
Glucose-Capillary: 156 mg/dL — ABNORMAL HIGH (ref 70–99)
Glucose-Capillary: 96 mg/dL (ref 70–99)

## 2022-07-01 MED ORDER — INSULIN ASPART PROT & ASPART (70-30 MIX) 100 UNIT/ML ~~LOC~~ SUSP
25.0000 [IU] | Freq: Two times a day (BID) | SUBCUTANEOUS | Status: DC
Start: 2022-07-01 — End: 2022-07-03
  Administered 2022-07-01 – 2022-07-03 (×5): 25 [IU] via SUBCUTANEOUS

## 2022-07-01 NOTE — Progress Notes (Signed)
Patient ID: Jeffrey Murphy, male   DOB: September 18, 1956, 66 y.o.   MRN: [id number]   He does feel somewhat better but not significantly relative to a couple days ago.  The neck has significant erythema and induration.  There is multiple areas of spontaneous draining and purulent bubbles at the skin surface. He has now had 4 days of antibiotics and to me the wound still has a significant mount of induration and erythema.  I think at this point a cleanout would be appropriate given the trial of antibiotics.  The patient agrees.  We discussed the procedure of incision and drainage including risk, benefits, and options.  All his questions were answered and consent was obtained.  This will be scheduled tomorrow and he needs to be n.p.o. after midnight.

## 2022-07-01 NOTE — Consult Note (Signed)
[facility]    Date of Admission:  06/27/2022     Reason for Consult: cellulitis/abscess    Referring Provider: Lanae Boast    Abx: 8/21-c piptazo 8/21-c vanc        Assessment: 66 yo male with uncontrolled dm2, htn, cocaine use, anxiety admitted for 4-5 days posterior neck pain in setting of cellulitis/abscess of same duration  Bcx negative on admission Ct neck tissue without concern of osseous involvement  ENT following and planning surgical debridement  No cx available yet  Clinically this is consistent with a staphylococcus aureus purulent cellulitis/abscess presentation. No evidence nec fasc at this time  Agree debridement probably needed given minimal or no improvement with abx alone thus far and location rather in proximity to c-spine  Initial admission bcx 8/21 negative. Low suspicion for dissemination at this time  He has hx of right knee periarticular nails and there is no evidence involvement at that site  Plan: Continue vancomycin Can stop piptazo Discussed with Dr Jearld Fenton of ENT to facilitate surgical culture on the next I&D Duration depending on surgical finding, but anticipate at least 2 weeks post-op: oral options post-op unless complicated by bacteremia If fever please repeat blood culture Will follow Discussed with primary team   I spent 75 minute reviewing data/chart, and coordinating care and >50% direct face to face time providing counseling/discussing diagnostics/treatment plan with patient    ------------------------------------------------ Principal Problem:   Abscess of neck Active Problems:   GERD (gastroesophageal reflux disease)   Hypertension   Diabetes mellitus with complication (HCC)   Drug abuse (HCC)   Hyponatremia with increased serum osmolality   Folliculitis    HPI: Jeffrey Murphy is a 66 y.o. male with uncontrolled dm2, htn, cocaine use, anxiety admitted for 4-5 days posterior neck  pain in setting of cellulitis/abscess of same duration  Patient was well until about 4-5 days prior to admission when he noticed acute onset of posterior neck pain  He is rather uncompliant and has poorly controlled dm2 and per signout not taken any medication  He does noticed purulence drainage off the posterior neck  No fever/chill/malaisea/n/v/diarrhea  Denies ivdu  He has right knee structure screws, not arthroplasty. No new pain in that area or swelling/redness in that area  On presentation, afebrile, leukocytosis 19 Placed on vanc/piptazo Ct neck showed no osseus involvement but a small abscess Bcx negative Ent following Despite what seems to be appropriate abx there is no improvement  Ent plans for surgical debridement soon  Family History  Problem Relation Age of Onset   Cancer Mother    Hypertension Mother    Diabetes Father     Social History   Tobacco Use   Smoking status: Never   Smokeless tobacco: Never  Vaping Use   Vaping Use: Never used  Substance Use Topics   Alcohol use: Not Currently    Comment: drinks every other day   Drug use: Yes    Frequency: 5.0 times per week    Types: Marijuana, Cocaine, "Crack" cocaine    Comment: last use months    No Known Allergies  Review of Systems: ROS All Other ROS was negative, except mentioned above   Past Medical History:  Diagnosis Date   Abscess 12/2016   LEFT INGUINAL ABSCESS   Arthritis    Diabetes mellitus without complication (HCC)    Hypertension    Wears glasses    reading  Scheduled Meds:  enoxaparin (LOVENOX) injection  40 mg Subcutaneous Q24H   insulin aspart  0-15 Units Subcutaneous TID WC   insulin aspart  0-5 Units Subcutaneous QHS   insulin aspart protamine- aspart  25 Units Subcutaneous BID WC   lisinopril  10 mg Oral Daily   mupirocin ointment   Topical TID   Continuous Infusions:  sodium chloride 50 mL/hr at 06/30/22 1021   vancomycin 1,250 mg (07/01/22 0944)    PRN Meds:.acetaminophen **OR** acetaminophen, hydrALAZINE, ondansetron **OR** ondansetron (ZOFRAN) IV, oxyCODONE   OBJECTIVE: Blood pressure 101/69, pulse 68, temperature (!) 97.4 F (36.3 C), temperature source Oral, resp. rate 18, height 5\' 9"  (1.753 m), weight 82.5 kg, SpO2 98 %.  Physical Exam  General/constitutional: no distress, pleasant, upset at not getting to eat his lunch which was taken away HEENT: Normocephalic, PER, Conj Clear, EOMI, Oropharynx clear Neck supple CV: rrr no mrg Lungs: clear to auscultation, normal respiratory effort Abd: Soft, Nontender Ext: no edema Skin: large purulent/erythematous rash with eschar and swelling/tenderness posterior neck Neuro: nonfocal MSK: no peripheral joint swelling/tenderness/warmth; back spines nontender; scar lateral right knee   Lab Results Lab Results  Component Value Date   WBC 11.1 (H) 07/01/2022   HGB 12.4 (L) 07/01/2022   HCT 36.6 (L) 07/01/2022   MCV 83.6 07/01/2022   PLT 258 07/01/2022    Lab Results  Component Value Date   CREATININE 0.46 (L) 07/01/2022   BUN 7 (L) 07/01/2022   NA 135 07/01/2022   K 4.5 07/01/2022   CL 100 07/01/2022   CO2 29 07/01/2022    Lab Results  Component Value Date   ALT 12 06/27/2022   AST 16 06/27/2022   ALKPHOS 124 06/27/2022   BILITOT 0.7 06/27/2022      Microbiology: Recent Results (from the past 240 hour(s))  Culture, blood (routine x 2)     Status: None (Preliminary result)   Collection Time: 06/27/22  4:28 PM   Specimen: BLOOD  Result Value Ref Range Status   Specimen Description BLOOD SITE NOT SPECIFIED  Final   Special Requests   Final    BOTTLES DRAWN AEROBIC AND ANAEROBIC Blood Culture results may not be optimal due to an excessive volume of blood received in culture bottles   Culture   Final    NO GROWTH 4 DAYS Performed at [facility], [address], [place], [place]    Report Status PENDING  Incomplete  Culture, blood (routine x 2)      Status: None (Preliminary result)   Collection Time: 06/27/22  5:11 PM   Specimen: BLOOD  Result Value Ref Range Status   Specimen Description BLOOD BLOOD LEFT HAND  Final   Special Requests   Final    BOTTLES DRAWN AEROBIC ONLY Blood Culture results may not be optimal due to an inadequate volume of blood received in culture bottles   Culture   Final    NO GROWTH 4 DAYS Performed at [facility], [address] [place], [place]    Report Status PENDING  Incomplete     Serology:    Imaging: If present, new imagings (plain films, ct scans, and mri) have been personally visualized and interpreted; radiology reports have been reviewed. Decision making incorporated into the Impression / Recommendations.  8/21 neck soft tissue ct with contrast Extensive cellulitis and phlegmon within the posterior neck soft tissues. Suspected 4 mm abscess within the subcutaneous fat of the posterior neck,  eccentric to the right at the C5 level.   Associated bilateral cervical lymphadenopathy, as described and likely reactive.   No CT evidence of osteomyelitis within the cervical spine. Cervical spondylosis, as described.   Mucous retention cysts within the bilateral maxillary sinuses measuring up to 12 mm.   Atherosclerotic plaque within the visualized aortic arch, proximal major branch vessels of the neck and carotid arteries. Possible hemodynamically significant stenosis within the proximal ICAs, bilaterally. Consider a non-emergent carotid artery duplex for further evaluation.    Raymondo Band, MD [facility] [phone] pager    07/01/2022, 10:45 AM

## 2022-07-01 NOTE — Progress Notes (Signed)
PROGRESS NOTE Jeffrey ODETTE  Murphy:[id number] DOB: Jun 01, 1956 DOA: 06/27/2022 PCP: Pcp, No   Brief Narrative/Hospital Course: 66 year old with uncontrolled type 2 diabetes currently not taking treatment, essential hypertension presented to the ED  W/ about 5 days of posterior neck pain and swelling and found to have carbuncle with significant induration and blood sugars more than 400> being managed for sepsis POA due to abscess and cellulitis of the neck/severe folliculitis with uncontrolled diabetes.  He is following closely at this time no need for surgical debridement.  With IV antibiotics leukocytosis has nicely improved.    Subjective: Seen this am Says he had bad night-was sweaty, sugar in 69 and symptomatic. Cbg in bmp in 81  Assessment and Plan: Principal Problem:   Abscess of neck Active Problems:   GERD (gastroesophageal reflux disease)   Hypertension   Diabetes mellitus with complication (HCC)   Drug abuse (HCC)   Hyponatremia with increased serum osmolality   Folliculitis  Severe folliculitis Sepsis POA due to above Abscess and cellulitis of the neck: Infection in the setting of uncontrolled hyperglycemia.  ENT input appreciated wound was cleaned applied antibacterial by ENT 8/24.  Continue wound care.  Continue current IV vancomycin and Zosyn, consulted ID.still having pain seen by ENT with significant erythema and induration planning for I&D in the morning. Wbc trending down, bood culture no growth so far.Local wound cultures not drawn previously skin culture showed MSSA. Recent Labs  Lab 06/27/22 0809 06/27/22 1600 06/28/22 0400 07/01/22 0303  WBC 19.8*  --  17.7* 11.1*  LATICACIDVEN 1.8 1.7  --   --      T2DM with uncontrolled hyperglycemia untreated hemoglobin A1c 13. Blood sugar on the lower end last night and symptomatic.previously on insulin 70/30, 35 units bid-but had to stop for few weeks.  Due to low blood sugar decrease insulin to 25 units twice daily  w/  70/30, keep on ssi. Counseling provided and patient agreeable to continue insulin on discharge Recent Labs  Lab 06/27/22 0809 06/27/22 0812 06/29/22 1542 06/29/22 2205 06/30/22 0753 06/30/22 1145 06/30/22 1542  GLUCAP  --    < > 127* 180* 149* 194* 195*  HGBA1C 13.4*  --   --   --   --   --   --    < > = values in this interval not displayed.     Essential hypertension: Untreated.  Currently controlled on 10 mg lisinopril. Pseudohyponatremia in the setting of hyperglycemia-resolved   DVT prophylaxis: enoxaparin (LOVENOX) injection 40 mg Start: 06/27/22 1745 Code Status:   Code Status: Full Code Family Communication: plan of care discussed with patient at bedside. Patient status is: Inpatient because of ongoing management of his infection Level of care: Med-Surg   Dispo: The patient is from: Home            Anticipated disposition: Home in 24 hrs, pending ID eval.   Mobility Assessment (last 72 hours)     Mobility Assessment     Row Name 06/30/22 [phone] 06/29/22 0746 06/28/22 1128       Does patient have an order for bedrest or is patient medically unstable No - Continue assessment No - Continue assessment No - Continue assessment     What is the highest level of mobility based on the progressive mobility assessment? Level 6 (Walks independently in room and hall) - Balance while walking in room without assist - Complete Level 6 (Walks independently in room and hall) - Balance while walking in room  without assist - Complete Level 5 (Walks with assist in room/hall) - Balance while stepping forward/back and can walk in room with assist - Complete               Objective: Vitals last 24 hrs: Vitals:   06/30/22 2355 07/01/22 0015 07/01/22 0117 07/01/22 0414  BP: 123/79 105/68 101/69   Pulse: 65 63 68   Resp:  18 18   Temp: (!) 95 F (35 C) (!) 95.3 F (35.2 C) (!) 96 F (35.6 C) (!) 97.4 F (36.3 C)  TempSrc: Oral Oral Oral Oral  SpO2:  98% 98%   Weight:      Height:        Weight change:   Physical Examination: General exam: AA, older than stated age, weak appearing. HEENT:Oral mucosa moist, Ear/Nose WNL grossly, dentition normal. Respiratory system: bilaterally diminished, no use of accessory muscle Cardiovascular system: S1 & S2 +, No JVD,. Gastrointestinal system: Abdomen soft,NT,ND,BS+ Nervous System:Alert, awake, moving extremities and grossly nonfocal Extremities: LE ankle edema neg, distal peripheral pulses palpable.  Skin: No rashes,no icterus.post neck area with dressing intact- tender. MSK: Normal muscle bulk,tone, power   Medications reviewed:  Scheduled Meds:  enoxaparin (LOVENOX) injection  40 mg Subcutaneous Q24H   insulin aspart  0-15 Units Subcutaneous TID WC   insulin aspart  0-5 Units Subcutaneous QHS   insulin aspart protamine- aspart  35 Units Subcutaneous BID WC   lisinopril  10 mg Oral Daily   mupirocin ointment   Topical TID   Continuous Infusions:  sodium chloride 50 mL/hr at 06/30/22 1021   piperacillin-tazobactam (ZOSYN)  IV 3.375 g (07/01/22 0411)   vancomycin 1,250 mg (06/30/22 1638)      Diet Order             Diet Carb Modified Fluid consistency: Thin; Room service appropriate? Yes  Diet effective now                   Intake/Output Summary (Last 24 hours) at 07/01/2022 0807 Last data filed at 07/01/2022 0100 Gross per 24 hour  Intake 720 ml  Output 100 ml  Net 620 ml    Net IO Since Admission: 896.02 mL [07/01/22 0807]  Wt Readings from Last 3 Encounters:  06/30/22 82.5 kg  05/29/19 95.7 kg  04/15/19 96.9 kg     Unresulted Labs (From admission, onward)     Start     Ordered   07/01/22 0500  Basic metabolic panel  Daily at 5am,   R      06/30/22 0831   07/01/22 0500  CBC  Daily at 5am,   R      06/30/22 0831   06/27/22 0758  Aerobic Culture w Gram Stain (superficial specimen)  Once,   URGENT        06/27/22 0757   06/27/22 0757  Urinalysis, Routine w reflex microscopic  Once,   URGENT         06/27/22 0757          Data Reviewed: I have personally reviewed following labs and imaging studies CBC: Recent Labs  Lab 06/27/22 0809 06/28/22 0400 07/01/22 0303  WBC 19.8* 17.7* 11.1*  NEUTROABS 16.8*  --   --   HGB 13.7 13.3 12.4*  HCT 41.4 39.4 36.6*  MCV 83.3 82.9 83.6  PLT 208 213 258    Basic Metabolic Panel: Recent Labs  Lab 06/27/22 0809 06/28/22 0400 06/30/22 0258 07/01/22 0303  NA 124*  130* 131* 135  K 4.5 4.7 3.7 4.5  CL 85* 90* 95* 100  CO2 24 30 26 29   GLUCOSE 478* 234* 119* 82  BUN 15 10 9  7*  CREATININE 0.84 0.68 0.47* 0.46*  CALCIUM 8.7* 8.7* 8.7* 8.7*  MG  --  1.7  --   --   PHOS  --  2.8  --   --     GFR: Estimated Creatinine Clearance: 94.5 mL/min (A) (by C-G formula based on SCr of 0.46 mg/dL (L)). Liver Function Tests: Recent Results (from the past 240 hour(s))  Culture, blood (routine x 2)     Status: None (Preliminary result)   Collection Time: 06/27/22  4:28 PM   Specimen: BLOOD  Result Value Ref Range Status   Specimen Description BLOOD SITE NOT SPECIFIED  Final   Special Requests   Final    BOTTLES DRAWN AEROBIC AND ANAEROBIC Blood Culture results may not be optimal due to an excessive volume of blood received in culture bottles   Culture   Final    NO GROWTH 4 DAYS Performed at [facility], [address], [place], [address] [place]    Report Status PENDING  Incomplete  Culture, blood (routine x 2)     Status: None (Preliminary result)   Collection Time: 06/27/22  5:11 PM   Specimen: BLOOD  Result Value Ref Range Status   Specimen Description BLOOD BLOOD LEFT HAND  Final   Special Requests   Final    BOTTLES DRAWN AEROBIC ONLY Blood Culture results may not be optimal due to an inadequate volume of blood received in culture bottles   Culture   Final    NO GROWTH 4 DAYS Performed at [facility], [address], [place], [address] [place]    Report Status PENDING  Incomplete     Antimicrobials: Anti-infectives (From admission, onward)    Start     Dose/Rate Route Frequency Ordered Stop   06/28/22 0500  vancomycin (VANCOREADY) IVPB 1250 mg/250 mL       See Hyperspace for full Linked Orders Report.   1,250 mg 166.7 mL/hr over 90 Minutes Intravenous Every 12 hours 06/27/22 1609     06/28/22 0100  piperacillin-tazobactam (ZOSYN) IVPB 3.375 g       See Hyperspace for full Linked Orders Report.   3.375 g 12.5 mL/hr over 240 Minutes Intravenous Every 8 hours 06/27/22 1609     06/27/22 1615  vancomycin (VANCOREADY) IVPB 2000 mg/400 mL       See Hyperspace for full Linked Orders Report.   2,000 mg 200 mL/hr over 120 Minutes Intravenous  Once 06/27/22 1609 06/27/22 2006   06/27/22 1615  piperacillin-tazobactam (ZOSYN) IVPB 3.375 g       See Hyperspace for full Linked Orders Report.   3.375 g 100 mL/hr over 30 Minutes Intravenous  Once 06/27/22 1609 06/27/22 1729      Culture/Microbiology    Component Value Date/Time   SDES BLOOD BLOOD LEFT HAND 06/27/2022 1711   SPECREQUEST  06/27/2022 1711    BOTTLES DRAWN AEROBIC ONLY Blood Culture results may not be optimal due to an inadequate volume of blood received in culture bottles   CULT  06/27/2022 1711    NO GROWTH 4 DAYS Performed at [facility], [address], [place], [address] [place]    REPTSTATUS PENDING 06/27/2022 1711    Other culture-see note  Radiology Studies: No results found.   LOS: 4 days  Lanae Boast, MD [facility]  07/01/2022, 8:07 AM

## 2022-07-01 NOTE — H&P (View-Only) (Signed)
Patient ID: Jeffrey Murphy, male   DOB: 03/06/1956, 65 y.o.   MRN: [id number]   He does feel somewhat better but not significantly relative to a couple days ago.  The neck has significant erythema and induration.  There is multiple areas of spontaneous draining and purulent bubbles at the skin surface. He has now had 4 days of antibiotics and to me the wound still has a significant mount of induration and erythema.  I think at this point a cleanout would be appropriate given the trial of antibiotics.  The patient agrees.  We discussed the procedure of incision and drainage including risk, benefits, and options.  All his questions were answered and consent was obtained.  This will be scheduled tomorrow and he needs to be n.p.o. after midnight. 

## 2022-07-02 ENCOUNTER — Encounter (HOSPITAL_COMMUNITY)
Admission: EM | Disposition: A | Discharge status: Home / Self Care | Payer: Self-pay | Source: Home / Self Care | Attending: Internal Medicine

## 2022-07-02 ENCOUNTER — Other Ambulatory Visit: Payer: Self-pay

## 2022-07-02 ENCOUNTER — Inpatient Hospital Stay (HOSPITAL_COMMUNITY): Payer: Medicare PPO | Admitting: Certified Registered Nurse Anesthetist

## 2022-07-02 ENCOUNTER — Other Ambulatory Visit (HOSPITAL_COMMUNITY): Payer: Self-pay

## 2022-07-02 ENCOUNTER — Encounter (HOSPITAL_COMMUNITY): Payer: Self-pay | Admitting: Internal Medicine

## 2022-07-02 DIAGNOSIS — E1165 Type 2 diabetes mellitus with hyperglycemia: Secondary | ICD-10-CM | POA: Diagnosis not present

## 2022-07-02 DIAGNOSIS — I10 Essential (primary) hypertension: Secondary | ICD-10-CM

## 2022-07-02 DIAGNOSIS — L0211 Cutaneous abscess of neck: Secondary | ICD-10-CM

## 2022-07-02 HISTORY — PX: INCISION AND DRAINAGE ABSCESS: SHX5864

## 2022-07-02 LAB — CBC
HCT: 38.7 % — ABNORMAL LOW (ref 39.0–52.0)
Hemoglobin: 12.6 g/dL — ABNORMAL LOW (ref 13.0–17.0)
MCH: 27.4 pg (ref 26.0–34.0)
MCHC: 32.6 g/dL (ref 30.0–36.0)
MCV: 84.1 fL (ref 80.0–100.0)
Platelets: 326 10*3/uL (ref 150–400)
RBC: 4.6 MIL/uL (ref 4.22–5.81)
RDW: 13.2 % (ref 11.5–15.5)
WBC: 9.8 10*3/uL (ref 4.0–10.5)
nRBC: 0 % (ref 0.0–0.2)

## 2022-07-02 LAB — BASIC METABOLIC PANEL
Anion gap: 6 (ref 5–15)
BUN: 8 mg/dL (ref 8–23)
CO2: 28 mmol/L (ref 22–32)
Calcium: 8.8 mg/dL — ABNORMAL LOW (ref 8.9–10.3)
Chloride: 100 mmol/L (ref 98–111)
Creatinine, Ser: 0.51 mg/dL — ABNORMAL LOW (ref 0.61–1.24)
GFR, Estimated: >60 mL/min (ref >60)
Glucose, Bld: 100 mg/dL — ABNORMAL HIGH (ref 70–99)
Potassium: 4 mmol/L (ref 3.5–5.1)
Sodium: 134 mmol/L — ABNORMAL LOW (ref 135–145)

## 2022-07-02 LAB — CULTURE, BLOOD (ROUTINE X 2)
Culture: NO GROWTH
Culture: NO GROWTH

## 2022-07-02 LAB — GLUCOSE, CAPILLARY
Glucose-Capillary: 129 mg/dL — ABNORMAL HIGH (ref 70–99)
Glucose-Capillary: 183 mg/dL — ABNORMAL HIGH (ref 70–99)
Glucose-Capillary: 298 mg/dL — ABNORMAL HIGH (ref 70–99)
Glucose-Capillary: 69 mg/dL — ABNORMAL LOW (ref 70–99)
Glucose-Capillary: 73 mg/dL (ref 70–99)
Glucose-Capillary: 77 mg/dL (ref 70–99)
Glucose-Capillary: 87 mg/dL (ref 70–99)
Glucose-Capillary: 88 mg/dL (ref 70–99)
Glucose-Capillary: 96 mg/dL (ref 70–99)

## 2022-07-02 LAB — VANCOMYCIN, PEAK: Vancomycin Pk: 19 ug/mL — ABNORMAL LOW (ref 30–40)

## 2022-07-02 LAB — SURGICAL PCR SCREEN
MRSA, PCR: NEGATIVE
Staphylococcus aureus: POSITIVE — AB

## 2022-07-02 SURGERY — INCISION AND DRAINAGE, ABSCESS
Anesthesia: General | Site: Neck

## 2022-07-02 MED ORDER — ROCURONIUM BROMIDE 10 MG/ML (PF) SYRINGE
PREFILLED_SYRINGE | INTRAVENOUS | Status: AC
  Filled 2022-07-02: qty 10

## 2022-07-02 MED ORDER — EPHEDRINE 5 MG/ML INJ
INTRAVENOUS | Status: AC
Start: 2022-07-02 — End: ?
  Filled 2022-07-02: qty 5

## 2022-07-02 MED ORDER — FENTANYL CITRATE (PF) 250 MCG/5ML IJ SOLN
INTRAMUSCULAR | Status: DC | PRN
  Administered 2022-07-02 (×2): 50 ug via INTRAVENOUS

## 2022-07-02 MED ORDER — ACETAMINOPHEN 500 MG PO TABS
1000.0000 mg | ORAL_TABLET | Freq: Once | ORAL | Status: AC
  Administered 2022-07-02: 1000 mg via ORAL

## 2022-07-02 MED ORDER — MEPERIDINE HCL 25 MG/ML IJ SOLN: 6.2500 mg | INTRAMUSCULAR | Status: DC | PRN

## 2022-07-02 MED ORDER — FENTANYL CITRATE (PF) 100 MCG/2ML IJ SOLN
25.0000 ug | INTRAMUSCULAR | Status: DC | PRN
  Administered 2022-07-02: 50 ug via INTRAVENOUS

## 2022-07-02 MED ORDER — MIDAZOLAM HCL 2 MG/2ML IJ SOLN
INTRAMUSCULAR | Status: DC | PRN
  Administered 2022-07-02: 2 mg via INTRAVENOUS

## 2022-07-02 MED ORDER — SUCCINYLCHOLINE CHLORIDE 200 MG/10ML IV SOSY
PREFILLED_SYRINGE | INTRAVENOUS | Status: DC | PRN
  Administered 2022-07-02: 80 mg via INTRAVENOUS

## 2022-07-02 MED ORDER — ORAL CARE MOUTH RINSE
15.0000 mL | Freq: Once | OROMUCOSAL | Status: AC
Start: 2022-07-02 — End: 2022-07-02

## 2022-07-02 MED ORDER — PROPOFOL 10 MG/ML IV BOLUS
INTRAVENOUS | Status: DC | PRN
  Administered 2022-07-02: 120 mg via INTRAVENOUS

## 2022-07-02 MED ORDER — PROPOFOL 10 MG/ML IV BOLUS
INTRAVENOUS | Status: AC
  Filled 2022-07-02: qty 20

## 2022-07-02 MED ORDER — CELECOXIB 200 MG PO CAPS
ORAL_CAPSULE | ORAL | Status: AC
  Filled 2022-07-02: qty 1

## 2022-07-02 MED ORDER — FENTANYL CITRATE (PF) 250 MCG/5ML IJ SOLN
INTRAMUSCULAR | Status: AC
  Filled 2022-07-02: qty 5

## 2022-07-02 MED ORDER — OXYCODONE HCL 5 MG/5ML PO SOLN: 5.0000 mg | Freq: Once | ORAL | Status: DC | PRN

## 2022-07-02 MED ORDER — ACETAMINOPHEN 500 MG PO TABS
ORAL_TABLET | ORAL | Status: AC
  Filled 2022-07-02: qty 2

## 2022-07-02 MED ORDER — PHENYLEPHRINE 80 MCG/ML (10ML) SYRINGE FOR IV PUSH (FOR BLOOD PRESSURE SUPPORT)
PREFILLED_SYRINGE | INTRAVENOUS | Status: AC
  Filled 2022-07-02: qty 10

## 2022-07-02 MED ORDER — CELECOXIB 200 MG PO CAPS
200.0000 mg | ORAL_CAPSULE | Freq: Once | ORAL | Status: AC
  Administered 2022-07-02: 200 mg via ORAL

## 2022-07-02 MED ORDER — INSULIN ASPART 100 UNIT/ML IJ SOLN: 0.0000 [IU] | INTRAMUSCULAR | Status: DC | PRN

## 2022-07-02 MED ORDER — PROMETHAZINE HCL 25 MG/ML IJ SOLN: 6.2500 mg | INTRAMUSCULAR | Status: DC | PRN

## 2022-07-02 MED ORDER — ONDANSETRON HCL 4 MG/2ML IJ SOLN
INTRAMUSCULAR | Status: DC | PRN
  Administered 2022-07-02: 4 mg via INTRAVENOUS

## 2022-07-02 MED ORDER — LIDOCAINE 2% (20 MG/ML) 5 ML SYRINGE
INTRAMUSCULAR | Status: AC
  Filled 2022-07-02: qty 5

## 2022-07-02 MED ORDER — ONDANSETRON HCL 4 MG/2ML IJ SOLN
INTRAMUSCULAR | Status: AC
Start: 2022-07-02 — End: ?
  Filled 2022-07-02: qty 2

## 2022-07-02 MED ORDER — FENTANYL CITRATE (PF) 100 MCG/2ML IJ SOLN
INTRAMUSCULAR | Status: AC
  Filled 2022-07-02: qty 2

## 2022-07-02 MED ORDER — LIDOCAINE 2% (20 MG/ML) 5 ML SYRINGE
INTRAMUSCULAR | Status: DC | PRN
  Administered 2022-07-02: 80 mg via INTRAVENOUS

## 2022-07-02 MED ORDER — OXYCODONE HCL 5 MG PO TABS: 5.0000 mg | ORAL_TABLET | Freq: Once | ORAL | Status: DC | PRN

## 2022-07-02 MED ORDER — PHENYLEPHRINE 80 MCG/ML (10ML) SYRINGE FOR IV PUSH (FOR BLOOD PRESSURE SUPPORT)
PREFILLED_SYRINGE | INTRAVENOUS | Status: DC | PRN
  Administered 2022-07-02: 160 ug via INTRAVENOUS
  Administered 2022-07-02: 240 ug via INTRAVENOUS
  Administered 2022-07-02 (×2): 160 ug via INTRAVENOUS
  Administered 2022-07-02: 40 ug via INTRAVENOUS

## 2022-07-02 MED ORDER — CHLORHEXIDINE GLUCONATE 0.12 % MT SOLN: 15.0000 mL | Freq: Once | OROMUCOSAL | Status: AC

## 2022-07-02 MED ORDER — LACTATED RINGERS IV SOLN: INTRAVENOUS | Status: DC

## 2022-07-02 MED ORDER — SUCCINYLCHOLINE CHLORIDE 200 MG/10ML IV SOSY
PREFILLED_SYRINGE | INTRAVENOUS | Status: AC
Start: 2022-07-02 — End: ?
  Filled 2022-07-02: qty 10

## 2022-07-02 MED ORDER — CHLORHEXIDINE GLUCONATE 0.12 % MT SOLN
OROMUCOSAL | Status: AC
  Administered 2022-07-02: 15 mL via OROMUCOSAL
  Filled 2022-07-02: qty 15

## 2022-07-02 MED ORDER — MIDAZOLAM HCL 2 MG/2ML IJ SOLN
INTRAMUSCULAR | Status: AC
Start: 2022-07-02 — End: ?
  Filled 2022-07-02: qty 2

## 2022-07-02 MED ORDER — 0.9 % SODIUM CHLORIDE (POUR BTL) OPTIME
TOPICAL | Status: DC | PRN
  Administered 2022-07-02: 1000 mL

## 2022-07-02 MED ORDER — ROCURONIUM BROMIDE 10 MG/ML (PF) SYRINGE
PREFILLED_SYRINGE | INTRAVENOUS | Status: DC | PRN
  Administered 2022-07-02: 40 mg via INTRAVENOUS

## 2022-07-02 MED ORDER — SUGAMMADEX SODIUM 200 MG/2ML IV SOLN
INTRAVENOUS | Status: DC | PRN
  Administered 2022-07-02: 400 mg via INTRAVENOUS

## 2022-07-02 SURGICAL SUPPLY — 54 items
ATTRACTOMAT 16X20 MAGNETIC DRP (DRAPES) IMPLANT
BAG COUNTER SPONGE SURGICOUNT (BAG) ×2 IMPLANT
BAG SPNG CNTER NS LX DISP (BAG) ×1
BLADE SURG 15 STRL LF DISP TIS (BLADE) IMPLANT
BLADE SURG 15 STRL SS (BLADE) ×1
CANISTER SUCT 3000ML PPV (MISCELLANEOUS) ×2 IMPLANT
CLEANER TIP ELECTROSURG 2X2 (MISCELLANEOUS) ×2 IMPLANT
CNTNR URN SCR LID CUP LEK RST (MISCELLANEOUS) ×2 IMPLANT
CONT SPEC 4OZ STRL OR WHT (MISCELLANEOUS)
COVER SURGICAL LIGHT HANDLE (MISCELLANEOUS) IMPLANT
DRAIN CHANNEL 15F RND FF W/TCR (WOUND CARE) IMPLANT
DRAIN PENROSE 0.25X12 (DRAIN) IMPLANT
DRAIN PENROSE 1/2X12 LTX STRL (WOUND CARE) IMPLANT
DRAPE HALF SHEET 40X57 (DRAPES) IMPLANT
DRAPE INCISE 13X13 STRL (DRAPES) IMPLANT
ELECT COATED BLADE 2.86 ST (ELECTRODE) ×2 IMPLANT
ELECT REM PT RETURN 9FT ADLT (ELECTROSURGICAL) ×1
ELECTRODE REM PT RTRN 9FT ADLT (ELECTROSURGICAL) ×2 IMPLANT
EVACUATOR SILICONE 100CC (DRAIN) IMPLANT
GAUZE 4X4 16PLY ~~LOC~~+RFID DBL (SPONGE) ×2 IMPLANT
GAUZE SPONGE 4X4 12PLY STRL (GAUZE/BANDAGES/DRESSINGS) ×2 IMPLANT
GLOVE SS BIOGEL STRL SZ 7.5 (GLOVE) ×2 IMPLANT
GLOVE SUPERSENSE BIOGEL SZ 7.5 (GLOVE) ×1
GOWN STRL REUS W/ TWL LRG LVL3 (GOWN DISPOSABLE) ×4 IMPLANT
GOWN STRL REUS W/TWL LRG LVL3 (GOWN DISPOSABLE) ×2
KIT BASIN OR (CUSTOM PROCEDURE TRAY) ×2 IMPLANT
KIT TURNOVER KIT B (KITS) ×2 IMPLANT
NDL HYPO 25GX1X1/2 BEV (NEEDLE) IMPLANT
NEEDLE HYPO 25GX1X1/2 BEV (NEEDLE) IMPLANT
NS IRRIG 1000ML POUR BTL (IV SOLUTION) ×2 IMPLANT
PAD ARMBOARD 7.5X6 YLW CONV (MISCELLANEOUS) ×4 IMPLANT
PENCIL FOOT CONTROL (ELECTRODE) ×2 IMPLANT
POSITIONER HEAD DONUT 9IN (MISCELLANEOUS) IMPLANT
SOL PREP POV-IOD 4OZ 10% (MISCELLANEOUS) IMPLANT
SPONGE GAUZE 4X4 12PLY STER LF (GAUZE/BANDAGES/DRESSINGS) IMPLANT
SPONGE INTESTINAL PEANUT (DISPOSABLE) IMPLANT
STAPLER VISISTAT 35W (STAPLE) ×2 IMPLANT
SUCTION FRAZIER TIP 8 FR DISP (SUCTIONS)
SUCTION TUBE FRAZIER 8FR DISP (SUCTIONS) IMPLANT
SUT CHROMIC 4 0 PS 2 18 (SUTURE) IMPLANT
SUT ETHILON 3 0 FSL (SUTURE) IMPLANT
SUT ETHILON 3 0 PS 1 (SUTURE) IMPLANT
SUT ETHILON 5 0 P 3 18 (SUTURE)
SUT NYLON ETHILON 5-0 P-3 1X18 (SUTURE) IMPLANT
SUT SILK 2 0 PERMA HAND 18 BK (SUTURE) IMPLANT
SWAB COLLECTION DEVICE MRSA (MISCELLANEOUS) IMPLANT
SWAB CULTURE ESWAB REG 1ML (MISCELLANEOUS) IMPLANT
TAPE CLOTH SURG 4X10 WHT LF (GAUZE/BANDAGES/DRESSINGS) IMPLANT
TAPE CLOTH SURG 6X10 WHT LF (GAUZE/BANDAGES/DRESSINGS) IMPLANT
TOWEL GREEN STERILE FF (TOWEL DISPOSABLE) ×2 IMPLANT
TRAY ENT MC OR (CUSTOM PROCEDURE TRAY) ×2 IMPLANT
TUBE GAUZE SZ 8 25YD (GAUZE/BANDAGES/DRESSINGS) IMPLANT
WATER STERILE IRR 1000ML POUR (IV SOLUTION) ×2 IMPLANT
YANKAUER SUCT BULB TIP NO VENT (SUCTIONS) IMPLANT

## 2022-07-02 NOTE — Anesthesia Procedure Notes (Signed)
Procedure Name: Intubation Date/Time: 07/02/2022 9:33 AM  Performed by: Lorie Phenix, CRNAPre-anesthesia Checklist: Patient identified, Emergency Drugs available, Suction available and Patient being monitored Patient Re-evaluated:Patient Re-evaluated prior to induction Oxygen Delivery Method: Circle system utilized Preoxygenation: Pre-oxygenation with 100% oxygen Induction Type: IV induction Ventilation: Mask ventilation without difficulty Laryngoscope Size: Mac and 4 Grade View: Grade II Tube type: Oral Tube size: 7.5 mm Number of attempts: 1 Airway Equipment and Method: Stylet Placement Confirmation: ETT inserted through vocal cords under direct vision, positive ETCO2 and breath sounds checked- equal and bilateral Secured at: 24 cm Tube secured with: Tape Dental Injury: Teeth and Oropharynx as per pre-operative assessment

## 2022-07-02 NOTE — Op Note (Signed)
Preop/postop diagnosis: Posterior neck abscess Procedure: Incision and drainage of neck abscess Anesthesia: General Estimated blood loss: Approximately 50 cc Indications: 66 year old with uncontrolled diabetes that has a neck infection that has not failed to resolve with intravenous antibiotics.  He continues to have pain.  At this point incision and drainage would be appropriate and indicated and patient agrees.  We discussed risk, benefits, and options.  All his questions were answered and consent was obtained. Procedure: Patient was taken the operating placed supine position after general endotracheal tube anesthesia was placed in the left lateral position.  The neck was prepped and draped in the usual sterile manner.  An incision was made in the center of the infection in the center of his neck posteriorly.  Blunt dissection was used with finger and the wound was opened in all directions with significant amount of purulence expressed.  Cultures were taken.  The wound was irrigated with saline copiously.  The Penrose drains were placed on both sides of the wound and secured with 3-0 nylon.  The patient was awakened brought recovery in stable condition counts correct

## 2022-07-02 NOTE — Interval H&P Note (Signed)
History and Physical Interval Note:  07/02/2022 9:07 AM  [name]  has presented today for surgery, with the diagnosis of Neck Abscess.  The various methods of treatment have been discussed with the patient and family. After consideration of risks, benefits and other options for treatment, the patient has consented to  Procedure(s): INCISION AND DRAINAGE ABSCESS (N/A) as a surgical intervention.  The patient's history has been reviewed, patient examined, no change in status, stable for surgery.  I have reviewed the patient's chart and labs.  Questions were answered to the patient's satisfaction.     Suzanna Obey

## 2022-07-02 NOTE — Progress Notes (Signed)
Pt aware of Poc, NPO status at this time, all needs addressed

## 2022-07-02 NOTE — Transfer of Care (Signed)
Immediate Anesthesia Transfer of Care Note  Patient: Jeffrey Murphy  Procedure(s) Performed: INCISION AND DRAINAGE ABSCESS  Patient Location: PACU  Anesthesia Type:General  Level of Consciousness: awake and alert   Airway & Oxygen Therapy: Patient Spontanous Breathing  Post-op Assessment: Report given to RN and Post -op Vital signs reviewed and stable  Post vital signs: Reviewed and stable  Last Vitals:  Vitals Value Taken Time  BP 131/80 07/02/22 1008  Temp    Pulse 89 07/02/22 1011  Resp 14 07/02/22 1011  SpO2 96 % 07/02/22 1011  Vitals shown include unvalidated device data.  Last Pain:  Vitals:   07/02/22 0801  TempSrc: Oral  PainSc:       Patients Stated Pain Goal: 0 (06/29/22 0948)  Complications: No notable events documented.

## 2022-07-02 NOTE — Progress Notes (Signed)
PROGRESS NOTE Jeffrey Murphy  RWE:[id number] DOB: [birth year] DOA: 06/27/2022 PCP: Pcp, No   Brief Narrative/Hospital Course: 66 year old with uncontrolled type 2 diabetes currently not taking treatment, essential hypertension presented to the ED  W/ about 5 days of posterior neck pain and swelling and found to have carbuncle with significant induration and blood sugars more than 400> being managed for sepsis POA due to abscess and cellulitis of the neck/severe folliculitis with uncontrolled diabetes.  He is following closely at this time no need for surgical debridement.  With IV antibiotics leukocytosis has nicely improved.with ongoing redness and induration despite IV antibiotics, ENT decided for incision drainage 8/26.  ID was also consulted    Subjective: Seen this am C/o pain and draining from the neck wound Overnight patient is afebrile.  Remains NPO.  Leukocytosis resolved About to go to OR this am  Assessment and Plan: Principal Problem:   Abscess of neck Active Problems:   GERD (gastroesophageal reflux disease)   Hypertension   Diabetes mellitus with complication (HCC)   Drug abuse (HCC)   Hyponatremia with increased serum osmolality   Folliculitis  Severe folliculitis Sepsis POA due to above Abscess and cellulitis of the neck: Infection in the setting of uncontrolled hyperglycemia. On  IV vancomycin and Zosyn, ID ENT following - with ongoing redness and induration despite IV antibiotics, ENT decided for incision drainage 8/26-planning  OR Culture.  Continue pain control.Previously skin culture showed MSSA. Cont pain control. Recent Labs  Lab 06/27/22 0809 06/27/22 1600 06/28/22 0400 07/01/22 0303 07/02/22 0417  WBC 19.8*  --  17.7* 11.1* 9.8  LATICACIDVEN 1.8 1.7  --   --   --     T2DM with uncontrolled hyperglycemia untreated hemoglobin A1c 13. No episodes of hypoglycemia.  Previously on insulin 70/30, 35 units bid-but has not taken for few weeks.  Due to low blood  sugar decreased insulin to 25 units twice daily  w/ 70/30 on 8/25, keep on ssi. Counseling provided and patient agreeable to continue insulin on discharge Recent Labs  Lab 06/27/22 0809 06/27/22 0812 06/30/22 1542 07/01/22 0924 07/01/22 1120 07/01/22 1610 07/01/22 2041  GLUCAP  --    < > 195* 168* 126* 156* 96  HGBA1C 13.4*  --   --   --   --   --   --    < > = values in this interval not displayed.    Essential hypertension: Untreated.  Currently controlled on 10 mg lisinopril. Pseudohyponatremia in the setting of hyperglycemia-resolved   DVT prophylaxis: enoxaparin (LOVENOX) injection 40 mg Start: 06/27/22 1745 Code Status:   Code Status: Full Code Family Communication: plan of care discussed with patient at bedside. Patient status is: Inpatient because of ongoing management of his infection Level of care: Med-Surg   Dispo: The patient is from: Home            Anticipated disposition: Home in 1-2 days pending I&D and culture sensitivity report    Mobility Assessment (last 72 hours)     Mobility Assessment     Row Name 06/30/22 [phone] 06/29/22 0746         Does patient have an order for bedrest or is patient medically unstable No - Continue assessment No - Continue assessment      What is the highest level of mobility based on the progressive mobility assessment? Level 6 (Walks independently in room and hall) - Balance while walking in room without assist - Complete Level 6 (Walks independently in  room and hall) - Balance while walking in room without assist - Complete                Objective: Vitals last 24 hrs: Vitals:   07/01/22 0117 07/01/22 0414 07/01/22 2039 07/01/22 2100  BP: 101/69  132/73   Pulse: 68  80   Resp: 18     Temp: (!) 96 F (35.6 C) (!) 97.4 F (36.3 C) 98 F (36.7 C)   TempSrc: Oral Oral Oral   SpO2: 98%  100%   Weight:    84.4 kg  Height:       Weight change:   Physical Examination: General exam: AAox3, older than stated age, weak  appearing. HEENT:Oral mucosa moist, Ear/Nose WNL grossly, dentition normal. Respiratory system: bilaterally diminished, no use of accessory muscle Cardiovascular system: S1 & S2 +, No JVD,. Gastrointestinal system: Abdomen soft,NT,ND,BS+ Nervous System:Alert, awake, moving extremities and grossly nonfocal Extremities: LE ankle edema neg, distal peripheral pulses palpable.  Skin: No rashes,no icterus. Post neck with wound with redness draining and induration MSK: Normal muscle bulk,tone, power   Medications reviewed:  Scheduled Meds:  enoxaparin (LOVENOX) injection  40 mg Subcutaneous Q24H   insulin aspart  0-15 Units Subcutaneous TID WC   insulin aspart  0-5 Units Subcutaneous QHS   insulin aspart protamine- aspart  25 Units Subcutaneous BID WC   lisinopril  10 mg Oral Daily   mupirocin ointment   Topical TID   Continuous Infusions:  sodium chloride 50 mL/hr at 06/30/22 1021   vancomycin 1,250 mg (07/02/22 0610)      Diet Order             Diet NPO time specified  Diet effective midnight                   Intake/Output Summary (Last 24 hours) at 07/02/2022 0724 Last data filed at 07/01/2022 1700 Gross per 24 hour  Intake 720 ml  Output --  Net 720 ml    Net IO Since Admission: 1,616.02 mL [07/02/22 0724]  Wt Readings from Last 3 Encounters:  07/01/22 84.4 kg  05/29/19 95.7 kg  04/15/19 96.9 kg     Unresulted Labs (From admission, onward)     Start     Ordered   07/01/22 0500  Basic metabolic panel  Daily at 5am,   R      06/30/22 0831   07/01/22 0500  CBC  Daily at 5am,   R      06/30/22 0831   06/27/22 0758  Aerobic Culture w Gram Stain (superficial specimen)  Once,   URGENT        06/27/22 0757   06/27/22 0757  Urinalysis, Routine w reflex microscopic  Once,   URGENT        06/27/22 0757          Data Reviewed: I have personally reviewed following labs and imaging studies CBC: Recent Labs  Lab 06/27/22 0809 06/28/22 0400 07/01/22 0303  07/02/22 0417  WBC 19.8* 17.7* 11.1* 9.8  NEUTROABS 16.8*  --   --   --   HGB 13.7 13.3 12.4* 12.6*  HCT 41.4 39.4 36.6* 38.7*  MCV 83.3 82.9 83.6 84.1  PLT 208 213 258 326   Basic Metabolic Panel: Recent Labs  Lab 06/27/22 0809 06/28/22 0400 06/30/22 0258 07/01/22 0303 07/02/22 0417  NA 124* 130* 131* 135 134*  K 4.5 4.7 3.7 4.5 4.0  CL 85* 90* 95* 100 100  CO2 24 30 26 29 28   GLUCOSE 478* 234* 119* 82 100*  BUN 15 10 9  7* 8  CREATININE 0.84 0.68 0.47* 0.46* 0.51*  CALCIUM 8.7* 8.7* 8.7* 8.7* 8.8*  MG  --  1.7  --   --   --   PHOS  --  2.8  --   --   --    GFR: Estimated Creatinine Clearance: 94.5 mL/min (A) (by C-G formula based on SCr of 0.51 mg/dL (L)). Liver Function Tests: Recent Results (from the past 240 hour(s))  Culture, blood (routine x 2)     Status: None (Preliminary result)   Collection Time: 06/27/22  4:28 PM   Specimen: BLOOD  Result Value Ref Range Status   Specimen Description BLOOD SITE NOT SPECIFIED  Final   Special Requests   Final    BOTTLES DRAWN AEROBIC AND ANAEROBIC Blood Culture results may not be optimal due to an excessive volume of blood received in culture bottles   Culture   Final    NO GROWTH 4 DAYS Performed at [facility], [address] [place], [address] [place]    Report Status PENDING  Incomplete  Culture, blood (routine x 2)     Status: None (Preliminary result)   Collection Time: 06/27/22  5:11 PM   Specimen: BLOOD  Result Value Ref Range Status   Specimen Description BLOOD BLOOD LEFT HAND  Final   Special Requests   Final    BOTTLES DRAWN AEROBIC ONLY Blood Culture results may not be optimal due to an inadequate volume of blood received in culture bottles   Culture   Final    NO GROWTH 4 DAYS Performed at [facility], [address] [place], [address] [place]    Report Status PENDING  Incomplete    Antimicrobials: Anti-infectives (From admission, onward)    Start     Dose/Rate Route Frequency Ordered  Stop   06/28/22 0500  vancomycin (VANCOREADY) IVPB 1250 mg/250 mL       See Hyperspace for full Linked Orders Report.   1,250 mg 166.7 mL/hr over 90 Minutes Intravenous Every 12 hours 06/27/22 1609     06/28/22 0100  piperacillin-tazobactam (ZOSYN) IVPB 3.375 g  Status:  Discontinued       See Hyperspace for full Linked Orders Report.   3.375 g 12.5 mL/hr over 240 Minutes Intravenous Every 8 hours 06/27/22 1609 07/01/22 1045   06/27/22 1615  vancomycin (VANCOREADY) IVPB 2000 mg/400 mL       See Hyperspace for full Linked Orders Report.   2,000 mg 200 mL/hr over 120 Minutes Intravenous  Once 06/27/22 1609 06/27/22 2006   06/27/22 1615  piperacillin-tazobactam (ZOSYN) IVPB 3.375 g       See Hyperspace for full Linked Orders Report.   3.375 g 100 mL/hr over 30 Minutes Intravenous  Once 06/27/22 1609 06/27/22 1729      Culture/Microbiology    Component Value Date/Time   SDES BLOOD BLOOD LEFT HAND 06/27/2022 1711   SPECREQUEST  06/27/2022 1711    BOTTLES DRAWN AEROBIC ONLY Blood Culture results may not be optimal due to an inadequate volume of blood received in culture bottles   CULT  06/27/2022 1711    NO GROWTH 4 DAYS Performed at [facility], [address], [place], [address] [place]    REPTSTATUS PENDING 06/27/2022 1711    Other culture-see note  Radiology Studies: No results found.   LOS: 5 days  Lanae Boast, MD [facility]  07/02/2022, 7:24 AM

## 2022-07-02 NOTE — Anesthesia Preprocedure Evaluation (Signed)
Anesthesia Evaluation  Patient identified by MRN, date of birth, ID band Patient awake    Reviewed: Allergy & Precautions, NPO status , Patient's Chart, lab work & pertinent test results  Airway Mallampati: III  TM Distance: >3 FB Neck ROM: Full    Dental  (+) Missing, Dental Advisory Given   Pulmonary neg pulmonary ROS,    Pulmonary exam normal breath sounds clear to auscultation       Cardiovascular hypertension, Pt. on medications  Rhythm:Regular Rate:Normal     Neuro/Psych negative neurological ROS     GI/Hepatic Neg liver ROS, GERD  ,  Endo/Other  diabetes, Poorly Controlled, Type 2  Renal/GU negative Renal ROS     Musculoskeletal negative musculoskeletal ROS (+)   Abdominal   Peds  Hematology negative hematology ROS (+)   Anesthesia Other Findings   Reproductive/Obstetrics                            Anesthesia Physical Anesthesia Plan  ASA: 3  Anesthesia Plan: General   Post-op Pain Management:    Induction: Intravenous, Rapid sequence and Cricoid pressure planned  PONV Risk Score and Plan: 2 and Ondansetron, Treatment may vary due to age or medical condition and Midazolam  Airway Management Planned: Oral ETT  Additional Equipment:   Intra-op Plan:   Post-operative Plan: Extubation in OR  Informed Consent: I have reviewed the patients History and Physical, chart, labs and discussed the procedure including the risks, benefits and alternatives for the proposed anesthesia with the patient or authorized representative who has indicated his/her understanding and acceptance.     Dental advisory given  Plan Discussed with: CRNA  Anesthesia Plan Comments:         Anesthesia Quick Evaluation

## 2022-07-02 NOTE — Progress Notes (Signed)
Pt returned to the floor from PACU at approx 10:55. On observation of neck procedure pts dressings were saturated with serosanguineous drainage. This nurse removed the tape and gauze and changed the dressing. Nurse used a stack of gauze with an ABD and finished with flexi tape. Pt tolerated well.

## 2022-07-02 NOTE — Anesthesia Postprocedure Evaluation (Signed)
Anesthesia Post Note  Patient: Jeffrey Murphy  Procedure(s) Performed: INCISION AND DRAINAGE ABSCESS (Neck)     Patient location during evaluation: PACU Anesthesia Type: General Level of consciousness: sedated and patient cooperative Pain management: pain level controlled Vital Signs Assessment: post-procedure vital signs reviewed and stable Respiratory status: spontaneous breathing Cardiovascular status: stable Anesthetic complications: no   No notable events documented.  Last Vitals:  Vitals:   07/02/22 1043 07/02/22 1056  BP: 137/84 128/83  Pulse: 85 85  Resp: 13 16  Temp: 36.6 C (!) 36.4 C  SpO2: 95% 100%    Last Pain:  Vitals:   07/02/22 1056  TempSrc: Oral  PainSc:                  Lewie Loron

## 2022-07-03 ENCOUNTER — Encounter (HOSPITAL_COMMUNITY): Payer: Self-pay | Admitting: Otolaryngology

## 2022-07-03 DIAGNOSIS — L0211 Cutaneous abscess of neck: Secondary | ICD-10-CM | POA: Diagnosis not present

## 2022-07-03 LAB — BASIC METABOLIC PANEL
Anion gap: 9 (ref 5–15)
Anion gap: 7 (ref 5–15)
BUN: 9 mg/dL (ref 8–23)
BUN: 10 mg/dL (ref 8–23)
CO2: 30 mmol/L (ref 22–32)
CO2: 27 mmol/L (ref 22–32)
Calcium: 9.2 mg/dL (ref 8.9–10.3)
Calcium: 8.8 mg/dL — ABNORMAL LOW (ref 8.9–10.3)
Chloride: 100 mmol/L (ref 98–111)
Chloride: 97 mmol/L — ABNORMAL LOW (ref 98–111)
Creatinine, Ser: 0.71 mg/dL (ref 0.61–1.24)
Creatinine, Ser: 0.61 mg/dL (ref 0.61–1.24)
GFR, Estimated: >60 mL/min (ref >60)
GFR, Estimated: >60 mL/min (ref >60)
Glucose, Bld: 95 mg/dL (ref 70–99)
Glucose, Bld: 214 mg/dL — ABNORMAL HIGH (ref 70–99)
Potassium: 5.2 mmol/L — ABNORMAL HIGH (ref 3.5–5.1)
Potassium: 4.4 mmol/L (ref 3.5–5.1)
Sodium: 139 mmol/L (ref 135–145)
Sodium: 131 mmol/L — ABNORMAL LOW (ref 135–145)

## 2022-07-03 LAB — CBC
HCT: 38.4 % — ABNORMAL LOW (ref 39.0–52.0)
Hemoglobin: 12.3 g/dL — ABNORMAL LOW (ref 13.0–17.0)
MCH: 27.4 pg (ref 26.0–34.0)
MCHC: 32 g/dL (ref 30.0–36.0)
MCV: 85.5 fL (ref 80.0–100.0)
Platelets: 294 10*3/uL (ref 150–400)
RBC: 4.49 MIL/uL (ref 4.22–5.81)
RDW: 13.2 % (ref 11.5–15.5)
WBC: 8.2 10*3/uL (ref 4.0–10.5)
nRBC: 0 % (ref 0.0–0.2)

## 2022-07-03 LAB — VANCOMYCIN, TROUGH: Vancomycin Tr: 11 ug/mL — ABNORMAL LOW (ref 15–20)

## 2022-07-03 LAB — GLUCOSE, CAPILLARY
Glucose-Capillary: 235 mg/dL — ABNORMAL HIGH (ref 70–99)
Glucose-Capillary: 151 mg/dL — ABNORMAL HIGH (ref 70–99)
Glucose-Capillary: 151 mg/dL — ABNORMAL HIGH (ref 70–99)
Glucose-Capillary: 108 mg/dL — ABNORMAL HIGH (ref 70–99)

## 2022-07-03 MED ORDER — SODIUM ZIRCONIUM CYCLOSILICATE 5 G PO PACK
5.0000 g | PACK | Freq: Once | ORAL | Status: AC
  Administered 2022-07-03: 5 g via ORAL
  Filled 2022-07-03 (×2): qty 1

## 2022-07-03 MED ORDER — VANCOMYCIN HCL 1750 MG/350ML IV SOLN
1750.0000 mg | Freq: Two times a day (BID) | INTRAVENOUS | Status: DC
Start: 2022-07-03 — End: 2022-07-04
  Administered 2022-07-03 – 2022-07-04 (×2): 1750 mg via INTRAVENOUS
  Filled 2022-07-03 (×2): qty 350

## 2022-07-03 MED ORDER — INSULIN ASPART PROT & ASPART (70-30 MIX) 100 UNIT/ML ~~LOC~~ SUSP
30.0000 [IU] | Freq: Two times a day (BID) | SUBCUTANEOUS | Status: DC
  Administered 2022-07-03 – 2022-07-05 (×4): 30 [IU] via SUBCUTANEOUS

## 2022-07-03 NOTE — Progress Notes (Signed)
Patient ID: Jeffrey Murphy, male   DOB: [birth year], 66 y.o.   MRN: [id number] He is doing reasonably well.  He does have less pain.  The wound is draining nicely.    Examination of the wound reveals the area is much less erythematous and tenderness is significantly improved.  The wound is clearly far less indurated.  Status post incision and drainage.  Cultures are pending.  He seems to be improving.  May be the drains out tomorrow.  Continue antibiotic therapy.

## 2022-07-03 NOTE — Progress Notes (Signed)
PROGRESS NOTE Jeffrey Murphy  WGY:[id number] DOB: 27-May-1956 DOA: 06/27/2022 PCP: Pcp, No   Brief Narrative/Hospital Course: 66 year old with uncontrolled type 2 diabetes currently not taking treatment, essential hypertension presented to the ED  W/ about 5 days of posterior neck pain and swelling and found to have carbuncle with significant induration and blood sugars more than 400> being managed for sepsis POA due to abscess and cellulitis of the neck/severe folliculitis with uncontrolled diabetes.  He is following closely at this time no need for surgical debridement.  With IV antibiotics leukocytosis has nicely improved.with ongoing redness and induration despite IV antibiotics, ENT decided for incision drainage 8/26.  ID was also consulted    Subjective: Seen and examined this morning He is on the bedside chair Overnight no fever  Potassium slightly up this morning in the lab  Underwent incision drainage of neck abscess yesterday  Assessment and Plan: Principal Problem:   Abscess of neck Active Problems:   GERD (gastroesophageal reflux disease)   Hypertension   Diabetes mellitus with complication (HCC)   Drug abuse (HCC)   Hyponatremia with increased serum osmolality   Folliculitis  Severe folliculitis Abscess and cellulitis of the posterior neck Sepsis POA due to above: Infection in the setting of uncontrolled hyperglycemia. On  IV vancomycin and Zosyn, ID ENT following- Underwent incision drainage of neck abscess due to persistent induration redness despite antibiotics. OR culture 8/27-GS w/ G[facility] in pairs. Continue pain control.Previously skin culture showed MSSA. Cont pain control. Recent Labs  Lab 06/27/22 0809 06/27/22 1600 06/28/22 0400 07/01/22 0303 07/02/22 0417 07/03/22 0445  WBC 19.8*  --  17.7* 11.1* 9.8 8.2  LATICACIDVEN 1.8 1.7  --   --   --   --     T2DM with uncontrolled hyperglycemia untreated hemoglobin A1c 13. No episodes of hypoglycemia.  Previously on  insulin 70/30, 35 units bid-but has not taken for few weeks.  Due to low blood sugar so decreased insulin to 25 units twice daily on 8/25-will increase to 30 units twice daily continue SSI.Continue to monitor and adjust insulin regimen. Counseling provided and patient agreeable to continue insulin on discharge. Recent Labs  Lab 06/27/22 0809 06/27/22 0812 07/02/22 0810 07/02/22 1013 07/02/22 1621 07/02/22 1954 07/03/22 0826  GLUCAP  --    < > 129* 88 298* 183* 235*  HGBA1C 13.4*  --   --   --   --   --   --    < > = values in this interval not displayed.  Mild hyperkalemia : will dose w/ lokelma and will repeat labs Recent Labs  Lab 06/28/22 0400 06/30/22 0258 07/01/22 0303 07/02/22 0417 07/03/22 0445  K 4.7 3.7 4.5 4.0 5.2*    Essential hypertension: Untreated. Hold acei 2/2 high k. Pseudohyponatremia in the setting of hyperglycemia-resolved   DVT prophylaxis: enoxaparin (LOVENOX) injection 40 mg Start: 06/27/22 1745 Code Status:   Code Status: Full Code Family Communication: plan of care discussed with patient at bedside. Patient status is: Inpatient because of ongoing management of his infection Level of care: Med-Surg   Dispo: The patient is from: Home            Anticipated disposition: Home in 1-2 days pending I&D culture and wound status   Mobility Assessment (last 72 hours)     Mobility Assessment     Row Name 07/02/22 1950 07/02/22 0800         Does patient have an order for bedrest or is patient medically  unstable No - Continue assessment No - Continue assessment      What is the highest level of mobility based on the progressive mobility assessment? Level 6 (Walks independently in room and hall) - Balance while walking in room without assist - Complete Level 6 (Walks independently in room and hall) - Balance while walking in room without assist - Complete                Objective: Vitals last 24 hrs: Vitals:   07/02/22 1950 07/02/22 2350 07/03/22 0350  07/03/22 0725  BP: 123/68 96/68 122/60 128/76  Pulse: 84 63 70 81  Resp: 18 18 16 18   Temp: 98.8 F (37.1 C) 98.3 F (36.8 C) (!) 97.5 F (36.4 C) 97.7 F (36.5 C)  TempSrc: Oral  Oral Oral  SpO2: 97% 97% 100% 98%  Weight:      Height:       Weight change: 0 kg  Physical Examination: General exam: AAox3, older than stated age, weak appearing. HEENT:Oral mucosa moist, Ear/Nose WNL grossly, dentition normal. Respiratory system: bilaterally diminished, no use of accessory muscle Cardiovascular system: S1 & S2 +, No JVD,. Gastrointestinal system: Abdomen soft,NT,ND,BS+ Nervous System:Alert, awake, moving extremities and grossly nonfocal Extremities: LE ankle edema neg, distal peripheral pulses palpable.  Skin: No rashes,no icterus.  Posterior neck with dressing in place drain is present. MSK: Normal muscle bulk,tone, power   Medications reviewed:  Scheduled Meds:  enoxaparin (LOVENOX) injection  40 mg Subcutaneous Q24H   insulin aspart  0-15 Units Subcutaneous TID WC   insulin aspart  0-5 Units Subcutaneous QHS   insulin aspart protamine- aspart  25 Units Subcutaneous BID WC   mupirocin ointment   Topical TID   sodium zirconium cyclosilicate  5 g Oral Once   Continuous Infusions:  sodium chloride 50 mL/hr at 07/02/22 1742   vancomycin        Diet Order             Diet Carb Modified Fluid consistency: Thin; Room service appropriate? Yes  Diet effective now                   Intake/Output Summary (Last 24 hours) at 07/03/2022 0930 Last data filed at 07/03/2022 0827 Gross per 24 hour  Intake 4530 ml  Output 15 ml  Net 4515 ml    Net IO Since Admission: 6,131.02 mL [07/03/22 0930]  Wt Readings from Last 3 Encounters:  07/02/22 84.4 kg  05/29/19 95.7 kg  04/15/19 96.9 kg     Unresulted Labs (From admission, onward)     Start     Ordered   07/03/22 [id number]  Basic metabolic panel  Once-Timed,   TIMED       Question:  Specimen collection method  Answer:   Lab=Lab collect   07/03/22 0726   07/01/22 [id number]  Basic metabolic panel  Daily at 5am,   R      06/30/22 0831   07/01/22 0500  CBC  Daily at 5am,   R      06/30/22 0831   06/27/22 0758  Aerobic Culture w Gram Stain (superficial specimen)  Once,   URGENT        06/27/22 0757   06/27/22 0757  Urinalysis, Routine w reflex microscopic  Once,   URGENT        06/27/22 0757          Data Reviewed: I have personally reviewed following labs and imaging studies CBC: Recent  Labs  Lab 06/27/22 0809 06/28/22 0400 07/01/22 0303 07/02/22 0417 07/03/22 0445  WBC 19.8* 17.7* 11.1* 9.8 8.2  NEUTROABS 16.8*  --   --   --   --   HGB 13.7 13.3 12.4* 12.6* 12.3*  HCT 41.4 39.4 36.6* 38.7* 38.4*  MCV 83.3 82.9 83.6 84.1 85.5  PLT 208 213 258 326 294   Basic Metabolic Panel: Recent Labs  Lab 06/28/22 0400 06/30/22 0258 07/01/22 0303 07/02/22 0417 07/03/22 0445  NA 130* 131* 135 134* 139  K 4.7 3.7 4.5 4.0 5.2*  CL 90* 95* 100 100 100  CO2 30 26 29 28 30   GLUCOSE 234* 119* 82 100* 95  BUN 10 9 7* 8 9  CREATININE 0.68 0.47* 0.46* 0.51* 0.71  CALCIUM 8.7* 8.7* 8.7* 8.8* 9.2  MG 1.7  --   --   --   --   PHOS 2.8  --   --   --   --    GFR: Estimated Creatinine Clearance: 94.5 mL/min (by C-G formula based on SCr of 0.71 mg/dL). Liver Function Tests: Recent Results (from the past 240 hour(s))  Culture, blood (routine x 2)     Status: None   Collection Time: 06/27/22  4:28 PM   Specimen: BLOOD  Result Value Ref Range Status   Specimen Description BLOOD SITE NOT SPECIFIED  Final   Special Requests   Final    BOTTLES DRAWN AEROBIC AND ANAEROBIC Blood Culture results may not be optimal due to an excessive volume of blood received in culture bottles   Culture   Final    NO GROWTH 5 DAYS Performed at [facility], [address] [place], [place]    Report Status 07/02/2022 FINAL  Final  Culture, blood (routine x 2)     Status: None   Collection Time: 06/27/22  5:11 PM    Specimen: BLOOD  Result Value Ref Range Status   Specimen Description BLOOD BLOOD LEFT HAND  Final   Special Requests   Final    BOTTLES DRAWN AEROBIC ONLY Blood Culture results may not be optimal due to an inadequate volume of blood received in culture bottles   Culture   Final    NO GROWTH 5 DAYS Performed at [facility], [address], [place], [place]    Report Status 07/02/2022 FINAL  Final  Aerobic/Anaerobic Culture w Gram Stain (surgical/deep wound)     Status: None (Preliminary result)   Collection Time: 07/02/22 10:03 AM   Specimen: PATH Other; Tissue  Result Value Ref Range Status   Specimen Description ABSCESS  Final   Special Requests POSTERIOR NECK  Final   Gram Stain   Final    RARE WBC PRESENT, PREDOMINANTLY MONONUCLEAR FEW GRAM POSITIVE COCCI IN PAIRS IN CLUSTERS Performed at [facility], [address], [place], [place]    Culture PENDING  Incomplete   Report Status PENDING  Incomplete  Surgical pcr screen     Status: Abnormal   Collection Time: 07/02/22 10:23 AM   Specimen: Nasal Mucosa; Nasal Swab  Result Value Ref Range Status   MRSA, PCR NEGATIVE NEGATIVE Final   Staphylococcus aureus POSITIVE (A) NEGATIVE Final    Comment: (NOTE) The Xpert SA Assay (FDA approved for NASAL specimens in patients 68 years of age and older), is one component of a comprehensive surveillance program. It is not intended to diagnose infection nor to guide or monitor treatment. Performed  at [facility], [place] [address], [place], [place] [address]     Antimicrobials: Anti-infectives (From admission, onward)    Start     Dose/Rate Route Frequency Ordered Stop   07/03/22 1800  vancomycin (VANCOREADY) IVPB 1750 mg/350 mL       See Hyperspace for full Linked Orders Report.   1,750 mg 175 mL/hr over 120 Minutes Intravenous Every 12 hours 07/03/22 0737     06/28/22 0500  vancomycin (VANCOREADY) IVPB 1250 mg/250 mL  Status:  Discontinued        See Hyperspace for full Linked Orders Report.   1,250 mg 166.7 mL/hr over 90 Minutes Intravenous Every 12 hours 06/27/22 1609 07/03/22 0737   06/28/22 0100  piperacillin-tazobactam (ZOSYN) IVPB 3.375 g  Status:  Discontinued       See Hyperspace for full Linked Orders Report.   3.375 g 12.5 mL/hr over 240 Minutes Intravenous Every 8 hours 06/27/22 1609 07/01/22 1045   06/27/22 1615  vancomycin (VANCOREADY) IVPB 2000 mg/400 mL       See Hyperspace for full Linked Orders Report.   2,000 mg 200 mL/hr over 120 Minutes Intravenous  Once 06/27/22 1609 06/27/22 2006   06/27/22 1615  piperacillin-tazobactam (ZOSYN) IVPB 3.375 g       See Hyperspace for full Linked Orders Report.   3.375 g 100 mL/hr over 30 Minutes Intravenous  Once 06/27/22 1609 06/27/22 1729      Culture/Microbiology    Component Value Date/Time   SDES ABSCESS 07/02/2022 1003   [place] NECK 07/02/2022 1003   CULT PENDING 07/02/2022 1003   REPTSTATUS PENDING 07/02/2022 1003    Other culture-see note  Radiology Studies: No results found.   LOS: 6 days   Antonieta Pert, MD [facility]  07/03/2022, 9:30 AM

## 2022-07-03 NOTE — Progress Notes (Signed)
Pharmacy Antibiotic Note - Follow-Up  Jeffrey Murphy is a 66 y.o. male admitted on 06/27/2022 with  cellulitis/abscess .  Pharmacy has been consulted for vancomycin dosing.  Today is abx day #7. Vancomycin peak of 19 ug/ml and trough of 11 ug/ml with AUC of 382.3 mcg/ml*hr on Vancomycin 1250 mg every 12 hours.   Plan:  Increase Vancomycin to 1750 mg IV Q 12 hrs. Goal AUC 400-550 mcg/ml*hr Expected AUC: 534 mcg/ml*hr Check vancomycin levels as needed  Monitor culture data, CBC, clinical progress  Height: 5' 9.02" (175.3 cm) Weight: 84.4 kg (186 lb 1.1 oz) IBW/kg (Calculated) : 70.74  Temp (24hrs), Avg:97.8 F (36.6 C), Min:97 F (36.1 C), Max:98.8 F (37.1 C)  Recent Labs  Lab 06/27/22 0809 06/27/22 1600 06/28/22 0400 06/30/22 0258 07/01/22 0303 07/02/22 0417 07/02/22 2145 07/03/22 0445  WBC 19.8*  --  17.7*  --  11.1* 9.8  --  8.2  CREATININE 0.84  --  0.68 0.47* 0.46* 0.51*  --  0.71  LATICACIDVEN 1.8 1.7  --   --   --   --   --   --   VANCOTROUGH  --   --   --   --   --   --   --  11*  VANCOPEAK  --   --   --   --   --   --  19*  --      Estimated Creatinine Clearance: 94.5 mL/min (by C-G formula based on SCr of 0.71 mg/dL).    No Known Allergies  Antimicrobials this admission: Vancomycin  8/21 >>  Zosyn 8/21 >> 8/25  Dose adjustments this admission: Increase vancomycin based on AUC monitoring 8/27  Microbiology results: 8/26 abscess gram stain: few gram positive cocci in pairs in clusters, cultures pending 8/21 BCx: ngtd  Bear Stearns, Pharm.D PGY1 Pharmacy Resident Phone [phone] 07/03/2022 7:29 AM  Thank you for allowing pharmacy to be a part of this patient's care.  **Pharmacist phone directory can be found on [email or web address] listed under [facility].

## 2022-07-04 DIAGNOSIS — A4101 Sepsis due to Methicillin susceptible Staphylococcus aureus: Secondary | ICD-10-CM | POA: Diagnosis not present

## 2022-07-04 DIAGNOSIS — E118 Type 2 diabetes mellitus with unspecified complications: Secondary | ICD-10-CM

## 2022-07-04 DIAGNOSIS — L0211 Cutaneous abscess of neck: Secondary | ICD-10-CM | POA: Diagnosis not present

## 2022-07-04 LAB — BASIC METABOLIC PANEL
Anion gap: 6 (ref 5–15)
BUN: 10 mg/dL (ref 8–23)
CO2: 28 mmol/L (ref 22–32)
Calcium: 8.9 mg/dL (ref 8.9–10.3)
Chloride: 102 mmol/L (ref 98–111)
Creatinine, Ser: 0.68 mg/dL (ref 0.61–1.24)
GFR, Estimated: >60 mL/min (ref >60)
Glucose, Bld: 96 mg/dL (ref 70–99)
Potassium: 4.1 mmol/L (ref 3.5–5.1)
Sodium: 136 mmol/L (ref 135–145)

## 2022-07-04 LAB — CBC
HCT: 41.1 % (ref 39.0–52.0)
Hemoglobin: 13.1 g/dL (ref 13.0–17.0)
MCH: 27.3 pg (ref 26.0–34.0)
MCHC: 31.9 g/dL (ref 30.0–36.0)
MCV: 85.6 fL (ref 80.0–100.0)
Platelets: 308 10*3/uL (ref 150–400)
RBC: 4.8 MIL/uL (ref 4.22–5.81)
RDW: 13.3 % (ref 11.5–15.5)
WBC: 8.1 10*3/uL (ref 4.0–10.5)
nRBC: 0 % (ref 0.0–0.2)

## 2022-07-04 LAB — GLUCOSE, CAPILLARY
Glucose-Capillary: 107 mg/dL — ABNORMAL HIGH (ref 70–99)
Glucose-Capillary: 125 mg/dL — ABNORMAL HIGH (ref 70–99)
Glucose-Capillary: 132 mg/dL — ABNORMAL HIGH (ref 70–99)

## 2022-07-04 MED ORDER — CEFADROXIL 500 MG PO CAPS
1000.0000 mg | ORAL_CAPSULE | Freq: Two times a day (BID) | ORAL | Status: DC
  Administered 2022-07-04 – 2022-07-05 (×2): 1000 mg via ORAL
  Filled 2022-07-04 (×3): qty 2

## 2022-07-04 NOTE — Progress Notes (Signed)
Patient ID: Jeffrey Murphy, male   DOB: 1956-06-27, 66 y.o.   MRN: [id number]  He is still hurting to the point he wants to stay on his side. He can lay on back and he says he is 10 times better than before surgery.   The wound is still draining and the skin is still with erythema purple color but no indurated or hard. The wound is open and the penrose were removed.   It is ok with me to try outpatient antibiotics once his his pain is tolerable. Culture Staph sensitive to multiple choices.  He will likely need 3 weeks

## 2022-07-04 NOTE — TOC Progression Note (Signed)
Transition of Care [facility]) - Progression Note    Patient Details  Name: Jeffrey Murphy MRN: [id number] Date of Birth: 1956/08/25  [facility] of [facility]) CM/SW Contact  Janae Bridgeman, RN Phone Number: 07/04/2022, 2:57 PM  Clinical Narrative:    CM spoke with bedside nursing and the patient needs continued diabetes education and wound care management in the home.  The patient also verbalizes difficulty paying for groceries in the home.  Home Health ordered to be co-signed by attending physician for home health RN and MSW - I called Trena Platt, [facility] liaison and approved for care in the home.  Discharge medications need to be sent through [facility] pharmacy for medication assistance.  CM will continue to follow the patient for home health needs and discharge to home in next day.   Expected Discharge Plan: Home/Self Care Barriers to Discharge: Continued Medical Work up  Expected Discharge Plan and Services Expected Discharge Plan: Home/Self Care In-house Referral: PCP / [facility] Discharge Planning Services: CM Consult Post Acute Care Choice: Durable Medical Equipment (Has Metrix glucometer at the home but needs test strips and lancets for home.) Living arrangements for the past 2 months: Single Family Home                                       Social Determinants of Health (SDOH) Interventions    Readmission Risk Interventions    06/29/2022   11:40 AM  Readmission Risk Prevention Plan  Transportation Screening Complete  PCP or Specialist Appt within 5-7 Days Complete  Home Care Screening Complete  Medication Review (RN CM) Complete

## 2022-07-04 NOTE — Progress Notes (Signed)
[facility]  Date of Admission:  06/27/2022           Reason for visit: Follow up on abscess and cellulitis of the posterior neck  Current antibiotics: Vancomycin    ASSESSMENT:    66 y.o. male admitted with:  Posterior neck cellulitis/abscess: Status post incision and drainage 8/26 with cultures growing MSSA.  Blood cultures finalized as no growth. Uncontrolled type 2 diabetes: Hemoglobin A1c 13. Sepsis: Secondary to #1 and improved.  RECOMMENDATIONS:    Stop vancomycin Transition to cefadroxil 1 g twice daily Plan for 2 weeks from date of I&D End date for therapy is 07/16/2022 Wound care Glycemic control Will sign off, please call as needed   Principal Problem:   Abscess of neck Active Problems:   GERD (gastroesophageal reflux disease)   Hypertension   Diabetes mellitus with complication (HCC)   Drug abuse (HCC)   Hyponatremia with increased serum osmolality   Folliculitis    MEDICATIONS:    Scheduled Meds:  enoxaparin (LOVENOX) injection  40 mg Subcutaneous Q24H   insulin aspart  0-15 Units Subcutaneous TID WC   insulin aspart  0-5 Units Subcutaneous QHS   insulin aspart protamine- aspart  30 Units Subcutaneous BID WC   mupirocin ointment   Topical TID   Continuous Infusions:  sodium chloride 50 mL/hr at 07/02/22 1742   vancomycin 1,750 mg (07/04/22 0647)   PRN Meds:.acetaminophen **OR** acetaminophen, hydrALAZINE, ondansetron **OR** ondansetron (ZOFRAN) IV, oxyCODONE  SUBJECTIVE:   24 hour events:  No acute events Tmax 98.1 Status post I&D of abscess 8/26 Cultures with Staph aureus Blood cultures no growth WBC normalized Creatinine stable No new imaging   No new complaints this morning.  His drain was removed.  He is concerned about wound care going home.  Tolerating antibiotics and no fevers.  Review of Systems  All other systems reviewed and are negative.     OBJECTIVE:   Blood pressure (!) 138/93,  pulse 87, temperature 98.1 F (36.7 C), temperature source Oral, resp. rate 17, height 5' 9.02" (1.753 m), weight 79.8 kg, SpO2 99 %. Body mass index is 25.97 kg/m.  Physical Exam Constitutional:      General: He is not in acute distress.    Appearance: Normal appearance.  HENT:     Head: Normocephalic and atraumatic.  Eyes:     Extraocular Movements: Extraocular movements intact.     Conjunctiva/sclera: Conjunctivae normal.  Neck:     Comments: Posterior neck wound with some surrounding erythema and packing in place.  Mild induration.  Drain has been removed. Pulmonary:     Effort: Pulmonary effort is normal. No respiratory distress.  Musculoskeletal:        General: Normal range of motion.  Skin:    General: Skin is warm and dry.  Neurological:     General: No focal deficit present.     Mental Status: He is alert and oriented to person, place, and time.  Psychiatric:        Mood and Affect: Mood normal.        Behavior: Behavior normal.      Lab Results: Lab Results  Component Value Date   WBC 8.1 07/04/2022   HGB 13.1 07/04/2022   HCT 41.1 07/04/2022   MCV 85.6 07/04/2022   PLT 308 07/04/2022    Lab Results  Component Value Date   NA 136 07/04/2022   K 4.1 07/04/2022   CO2 28 07/04/2022  GLUCOSE 96 07/04/2022   BUN 10 07/04/2022   CREATININE 0.68 07/04/2022   CALCIUM 8.9 07/04/2022   GFRNONAA >60 07/04/2022   GFRAA 112 04/15/2019    Lab Results  Component Value Date   ALT 12 06/27/2022   AST 16 06/27/2022   ALKPHOS 124 06/27/2022   BILITOT 0.7 06/27/2022    No results found for: "CRP"  No results found for: "ESRSEDRATE"   I have reviewed the micro and lab results in Epic.  Imaging: No results found.   Imaging independently reviewed in Epic.    Vedia Coffer for Infectious Disease [facility] [phone] pager 07/04/2022, 8:19 AM

## 2022-07-04 NOTE — Progress Notes (Signed)
PROGRESS NOTE Jeffrey Murphy  DXI:[id number] DOB: 1956/06/15 DOA: 06/27/2022 PCP: Pcp, No   Brief Narrative/Hospital Course: 66 year old with uncontrolled type 2 diabetes currently not taking treatment, essential hypertension presented to the ED  W/ about 5 days of posterior neck pain and swelling and found to have carbuncle with significant induration and blood sugars more than 400> being managed for sepsis POA due to abscess and cellulitis of the neck/severe folliculitis with uncontrolled diabetes.  He is following closely at this time no need for surgical debridement.  With IV antibiotics leukocytosis has nicely improved.with ongoing redness and induration despite IV antibiotics, ENT decided for incision drainage 8/26.  ID was also consulted.  Insulin was adjusted.   Subjective: Seen and examined this morning.  Still having pain but much improved from presentation Drain in place Assessment and Plan: Principal Problem:   Abscess of neck Active Problems:   GERD (gastroesophageal reflux disease)   Hypertension   Diabetes mellitus with complication (HCC)   Drug abuse (HCC)   Hyponatremia with increased serum osmolality   Folliculitis  Severe folliculitis MSSA abscess and cellulitis of the posterior neck Sepsis POA due to above: Infection in the setting of uncontrolled hyperglycemia.  Patient clinically feels improved, appreciate ENT input s/p incision drainage of neck abscess, and drain placement 8/27, culture with MSSA.continue pain management hopefully once pain is well controlled we will discharge him on oral antibiotics, await ID recommendation.   Recent Labs  Lab 06/27/22 1600 06/28/22 0400 07/01/22 0303 07/02/22 0417 07/03/22 0445 07/04/22 0425  WBC  --  17.7* 11.1* 9.8 8.2 8.1  LATICACIDVEN 1.7  --   --   --   --   --     T2DM with uncontrolled hyperglycemia untreated hemoglobin A1c 13. Blood sugar now well controlled, no more hypoglycemia.Previously on insulin 70/30, 35  units bid-but has not taken for few weeks.  Continue current 30 units twice daily, SSI.  Will send rx to [facility] pharmacy on discharge Recent Labs  Lab 07/02/22 1954 07/03/22 0826 07/03/22 1126 07/03/22 1608 07/03/22 2009  GLUCAP 183* 235* 151* 151* 108*   Mild hyperkalemia : resolved w/ lokelma Essential hypertension: Untreated. Resume acei Pseudohyponatremia in the setting of hyperglycemia-resolved   DVT prophylaxis: enoxaparin (LOVENOX) injection 40 mg Start: 06/27/22 1745 Code Status:   Code Status: Full Code Family Communication: plan of care discussed with patient at bedside. Patient status is: Inpatient because of ongoing management of his infection Level of care: Med-Surg   Dispo: The patient is from: Home            Anticipated disposition: Home likely tomorrow if neck pain is improved   Mobility Assessment (last 72 hours)     Mobility Assessment     Row Name 07/03/22 2013 07/02/22 1950 07/02/22 0800       Does patient have an order for bedrest or is patient medically unstable No - Continue assessment No - Continue assessment No - Continue assessment     What is the highest level of mobility based on the progressive mobility assessment? Level 6 (Walks independently in room and hall) - Balance while walking in room without assist - Complete Level 6 (Walks independently in room and hall) - Balance while walking in room without assist - Complete Level 6 (Walks independently in room and hall) - Balance while walking in room without assist - Complete               Objective: Vitals last 24 hrs: Vitals:  07/03/22 0725 07/03/22 2013 07/04/22 0500 07/04/22 0735  BP: 128/76 116/70  (!) 138/93  Pulse: 81 77  87  Resp: 18 19  17   Temp: 97.7 F (36.5 C) 98 F (36.7 C)  98.1 F (36.7 C)  TempSrc: Oral Oral  Oral  SpO2: 98% 99%  99%  Weight:   79.8 kg   Height:       Weight change: -4.6 kg  Physical Examination: General exam: AA, older than stated age, weak  appearing. HEENT:Oral mucosa moist, Ear/Nose WNL grossly, dentition normal. Respiratory system: bilaterally diminished, no use of accessory muscle Cardiovascular system: S1 & S2 +, No JVD,. Gastrointestinal system: Abdomen soft,NT,ND,BS+ Nervous System:Alert, awake, moving extremities and grossly nonfocal Extremities: LE ankle edema neg, distal peripheral pulses palpable.  Skin: Erythema with purple color but no induration at the incision and drainage site with drain in place.   MSK: Normal muscle bulk,tone, power   Medications reviewed:  Scheduled Meds:  enoxaparin (LOVENOX) injection  40 mg Subcutaneous Q24H   insulin aspart  0-15 Units Subcutaneous TID WC   insulin aspart  0-5 Units Subcutaneous QHS   insulin aspart protamine- aspart  30 Units Subcutaneous BID WC   mupirocin ointment   Topical TID   Continuous Infusions:  sodium chloride 50 mL/hr at 07/02/22 1742   vancomycin 1,750 mg (07/04/22 0647)      Diet Order             Diet Carb Modified Fluid consistency: Thin; Room service appropriate? Yes  Diet effective now                   Intake/Output Summary (Last 24 hours) at 07/04/2022 1010 Last data filed at 07/04/2022 0814 Gross per 24 hour  Intake 714 ml  Output --  Net 714 ml    Net IO Since Admission: 6,845.02 mL [07/04/22 1010]  Wt Readings from Last 3 Encounters:  07/04/22 79.8 kg  05/29/19 95.7 kg  04/15/19 96.9 kg     Unresulted Labs (From admission, onward)     Start     Ordered   07/01/22 0500  Basic metabolic panel  Daily at 5am,   R      06/30/22 0831   07/01/22 0500  CBC  Daily at 5am,   R      06/30/22 0831          Data Reviewed: I have personally reviewed following labs and imaging studies CBC: Recent Labs  Lab 06/28/22 0400 07/01/22 0303 07/02/22 0417 07/03/22 0445 07/04/22 0425  WBC 17.7* 11.1* 9.8 8.2 8.1  HGB 13.3 12.4* 12.6* 12.3* 13.1  HCT 39.4 36.6* 38.7* 38.4* 41.1  MCV 82.9 83.6 84.1 85.5 85.6  PLT 213 258 326  294 308   Basic Metabolic Panel: Recent Labs  Lab 06/28/22 0400 06/30/22 0258 07/01/22 0303 07/02/22 0417 07/03/22 0445 07/03/22 1249 07/04/22 0425  NA 130*   < > 135 134* 139 131* 136  K 4.7   < > 4.5 4.0 5.2* 4.4 4.1  CL 90*   < > 100 100 100 97* 102  CO2 30   < > 29 28 30 27 28   GLUCOSE 234*   < > 82 100* 95 214* 96  BUN 10   < > 7* 8 9 10 10   CREATININE 0.68   < > 0.46* 0.51* 0.71 0.61 0.68  CALCIUM 8.7*   < > 8.7* 8.8* 9.2 8.8* 8.9  MG 1.7  --   --   --   --   --   --  PHOS 2.8  --   --   --   --   --   --    < > = values in this interval not displayed.   GFR: Estimated Creatinine Clearance: 94.5 mL/min (by C-G formula based on SCr of 0.68 mg/dL). Liver Function Tests: Recent Results (from the past 240 hour(s))  Culture, blood (routine x 2)     Status: None   Collection Time: 06/27/22  4:28 PM   Specimen: BLOOD  Result Value Ref Range Status   Specimen Description BLOOD SITE NOT SPECIFIED  Final   Special Requests   Final    BOTTLES DRAWN AEROBIC AND ANAEROBIC Blood Culture results may not be optimal due to an excessive volume of blood received in culture bottles   Culture   Final    NO GROWTH 5 DAYS Performed at [facility], [address] [place], [place] [address]    Report Status 07/02/2022 FINAL  Final  Culture, blood (routine x 2)     Status: None   Collection Time: 06/27/22  5:11 PM   Specimen: BLOOD  Result Value Ref Range Status   Specimen Description BLOOD BLOOD LEFT HAND  Final   Special Requests   Final    BOTTLES DRAWN AEROBIC ONLY Blood Culture results may not be optimal due to an inadequate volume of blood received in culture bottles   Culture   Final    NO GROWTH 5 DAYS Performed at [facility], [address], [place], [place] [address]    Report Status 07/02/2022 FINAL  Final  Aerobic/Anaerobic Culture w Gram Stain (surgical/deep wound)     Status: None (Preliminary result)   Collection Time: 07/02/22 10:03 AM   Specimen:  PATH Other; Tissue  Result Value Ref Range Status   Specimen Description ABSCESS  Final   Special Requests POSTERIOR NECK  Final   Gram Stain   Final    RARE WBC PRESENT, PREDOMINANTLY MONONUCLEAR FEW GRAM POSITIVE COCCI IN PAIRS IN CLUSTERS Performed at [facility], [address] [place], [place] [address]    Culture   Final    MODERATE STAPHYLOCOCCUS AUREUS NO ANAEROBES ISOLATED; CULTURE IN PROGRESS FOR 5 DAYS    Report Status PENDING  Incomplete   Organism ID, Bacteria STAPHYLOCOCCUS AUREUS  Final      Susceptibility   Staphylococcus aureus - MIC*    CIPROFLOXACIN >=8 RESISTANT Resistant     ERYTHROMYCIN <=0.25 SENSITIVE Sensitive     GENTAMICIN <=0.5 SENSITIVE Sensitive     OXACILLIN 0.5 SENSITIVE Sensitive     TETRACYCLINE <=1 SENSITIVE Sensitive     VANCOMYCIN 1 SENSITIVE Sensitive     TRIMETH/SULFA <=10 SENSITIVE Sensitive     CLINDAMYCIN <=0.25 SENSITIVE Sensitive     RIFAMPIN <=0.5 SENSITIVE Sensitive     Inducible Clindamycin NEGATIVE Sensitive     * MODERATE STAPHYLOCOCCUS AUREUS  Surgical pcr screen     Status: Abnormal   Collection Time: 07/02/22 10:23 AM   Specimen: Nasal Mucosa; Nasal Swab  Result Value Ref Range Status   MRSA, PCR NEGATIVE NEGATIVE Final   Staphylococcus aureus POSITIVE (A) NEGATIVE Final    Comment: (NOTE) The Xpert SA Assay (FDA approved for NASAL specimens in patients 69 years of age and older), is one component of a comprehensive surveillance program. It is not intended to diagnose infection nor to guide or monitor treatment. Performed at [facility], [address] [place], [place] [address]  Antimicrobials: Anti-infectives (From admission, onward)    Start     Dose/Rate Route Frequency Ordered Stop   07/03/22 1800  vancomycin (VANCOREADY) IVPB 1750 mg/350 mL       See Hyperspace for full Linked Orders Report.   1,750 mg 175 mL/hr over 120 Minutes Intravenous Every 12 hours 07/03/22 0737     06/28/22 0500   vancomycin (VANCOREADY) IVPB 1250 mg/250 mL  Status:  Discontinued       See Hyperspace for full Linked Orders Report.   1,250 mg 166.7 mL/hr over 90 Minutes Intravenous Every 12 hours 06/27/22 1609 07/03/22 0737   06/28/22 0100  piperacillin-tazobactam (ZOSYN) IVPB 3.375 g  Status:  Discontinued       See Hyperspace for full Linked Orders Report.   3.375 g 12.5 mL/hr over 240 Minutes Intravenous Every 8 hours 06/27/22 1609 07/01/22 1045   06/27/22 1615  vancomycin (VANCOREADY) IVPB 2000 mg/400 mL       See Hyperspace for full Linked Orders Report.   2,000 mg 200 mL/hr over 120 Minutes Intravenous  Once 06/27/22 1609 06/27/22 2006   06/27/22 1615  piperacillin-tazobactam (ZOSYN) IVPB 3.375 g       See Hyperspace for full Linked Orders Report.   3.375 g 100 mL/hr over 30 Minutes Intravenous  Once 06/27/22 1609 06/27/22 1729      Culture/Microbiology    Component Value Date/Time   SDES ABSCESS 07/02/2022 1003   SPECREQUEST POSTERIOR NECK 07/02/2022 1003   CULT  07/02/2022 1003    MODERATE STAPHYLOCOCCUS AUREUS NO ANAEROBES ISOLATED; CULTURE IN PROGRESS FOR 5 DAYS    REPTSTATUS PENDING 07/02/2022 1003    Other culture-see note  Radiology Studies: No results found.   LOS: 7 days   Lanae Boast, MD [facility]  07/04/2022, 10:10 AM

## 2022-07-05 ENCOUNTER — Other Ambulatory Visit (HOSPITAL_COMMUNITY): Payer: Self-pay

## 2022-07-05 DIAGNOSIS — L0211 Cutaneous abscess of neck: Secondary | ICD-10-CM | POA: Diagnosis not present

## 2022-07-05 LAB — BASIC METABOLIC PANEL
Anion gap: 9 (ref 5–15)
BUN: 14 mg/dL (ref 8–23)
CO2: 28 mmol/L (ref 22–32)
Calcium: 9 mg/dL (ref 8.9–10.3)
Chloride: 100 mmol/L (ref 98–111)
Creatinine, Ser: 0.72 mg/dL (ref 0.61–1.24)
GFR, Estimated: >60 mL/min (ref >60)
Glucose, Bld: 115 mg/dL — ABNORMAL HIGH (ref 70–99)
Potassium: 4.2 mmol/L (ref 3.5–5.1)
Sodium: 137 mmol/L (ref 135–145)

## 2022-07-05 LAB — CBC
HCT: 37.7 % — ABNORMAL LOW (ref 39.0–52.0)
Hemoglobin: 12.4 g/dL — ABNORMAL LOW (ref 13.0–17.0)
MCH: 27.6 pg (ref 26.0–34.0)
MCHC: 32.9 g/dL (ref 30.0–36.0)
MCV: 84 fL (ref 80.0–100.0)
Platelets: 318 10*3/uL (ref 150–400)
RBC: 4.49 MIL/uL (ref 4.22–5.81)
RDW: 13.2 % (ref 11.5–15.5)
WBC: 8.3 10*3/uL (ref 4.0–10.5)
nRBC: 0 % (ref 0.0–0.2)

## 2022-07-05 LAB — GLUCOSE, CAPILLARY
Glucose-Capillary: 122 mg/dL — ABNORMAL HIGH (ref 70–99)
Glucose-Capillary: 64 mg/dL — ABNORMAL LOW (ref 70–99)
Glucose-Capillary: 98 mg/dL (ref 70–99)
Glucose-Capillary: 105 mg/dL — ABNORMAL HIGH (ref 70–99)

## 2022-07-05 MED ORDER — MUPIROCIN 2 % EX OINT
TOPICAL_OINTMENT | Freq: Three times a day (TID) | CUTANEOUS | 0 refills | Status: DC
  Filled 2022-07-05: qty 22, 30d supply, fill #0

## 2022-07-05 MED ORDER — INSULIN PEN NEEDLE 31G X 8 MM MISC
1.0000 | Freq: Every day | 0 refills | Status: AC
  Filled 2022-07-05: qty 100, 30d supply, fill #0

## 2022-07-05 MED ORDER — IBUPROFEN 200 MG PO TABS
400.0000 mg | ORAL_TABLET | Freq: Four times a day (QID) | ORAL | 0 refills | Status: AC | PRN
  Filled 2022-07-05: qty 30, 4d supply, fill #0

## 2022-07-05 MED ORDER — BLOOD GLUCOSE MONITOR KIT
PACK | 0 refills | Status: AC
  Filled 2022-07-05: qty 1, fill #0

## 2022-07-05 MED ORDER — CEFADROXIL 500 MG PO CAPS
1000.0000 mg | ORAL_CAPSULE | Freq: Two times a day (BID) | ORAL | 0 refills | Status: AC
  Filled 2022-07-05: qty 44, 11d supply, fill #0

## 2022-07-05 MED ORDER — NOVOLIN 70/30 FLEXPEN (70-30) 100 UNIT/ML ~~LOC~~ SUPN
30.0000 [IU] | PEN_INJECTOR | Freq: Two times a day (BID) | SUBCUTANEOUS | 0 refills | Status: DC
  Filled 2022-07-05: qty 18, 30d supply, fill #0

## 2022-07-05 NOTE — Plan of Care (Signed)

## 2022-07-05 NOTE — Discharge Summary (Signed)
Physician Discharge Summary  Jeffrey Murphy XKG:[id number] DOB: 10-23-1956 DOA: 06/27/2022  PCP: Pcp, No  Admit date: 06/27/2022 Discharge date: 07/05/2022 Recommendations for Outpatient Follow-up:  Follow up with PCP and with Dr. Janace Hoard in 1 weeks-call for appointment Please obtain BMP/CBC in one week  Discharge Dispo: Home with home health Discharge Condition: Stable Code Status:   Code Status: Full Code Diet recommendation:  Diet Order             Diet - low sodium heart healthy           Diet Carb Modified Fluid consistency: Thin; Room service appropriate? Yes  Diet effective now                    Brief/Interim Summary: 66 year old with uncontrolled type 2 diabetes currently not taking treatment, essential hypertension presented to the ED  W/ about 5 days of posterior neck pain and swelling and found to have carbuncle with significant induration and blood sugars more than 400> being managed for sepsis POA due to abscess and cellulitis of the neck/severe folliculitis with uncontrolled diabetes.  He is following closely at this time no need for surgical debridement.  With IV antibiotics leukocytosis has nicely improved.with ongoing redness and induration despite IV antibiotics, ENT decided for incision drainage 8/26.  ID was also consulted.  Insulin was adjusted. Underwent I&D, wound culture growing MSSA, ID advised cefadroxil through 9/9 okay for discharge as pain is improved, surgically site clean with minimal drainage.  We will continue dry dressing keep the area clean and follow-up with Dr. Janace Hoard as outpatient  Discharge Diagnoses:  Principal Problem:   Abscess of neck Active Problems:   GERD (gastroesophageal reflux disease)   Hypertension   Diabetes mellitus with complication ([place])   Drug abuse ([place])   Hyponatremia with increased serum osmolality   Folliculitis  Severe folliculitis MSSA abscess and cellulitis of the posterior neck Sepsis POA due to above: Infection in  the setting of uncontrolled hyperglycemia.  Patient clinically feels improved, appreciate ENT input s/p incision drainage of neck abscess Underwent I&D, wound culture growing MSSA, ID advised cefadroxil through 9/9 okay for discharge as pain is improved, surgically site clean with minimal drainage.  We will continue dry dressing keep the area clean and follow-up with Dr. Janace Hoard as outpatient  T2DM with uncontrolled hyperglycemia untreated hemoglobin A1c 13. Blood sugar overall well controlled after being back on insulin 70/30 continue at current dose prescription and medication will be arranged from [place] along with glucometer Accu-Chek lancet.   Mild hyperkalemia : resolved w/ lokelma Essential hypertension: Untreated.  Continue acei Pseudohyponatremia in the setting of hyperglycemia-resolved   Consults: ID, ENT Subjective: Alert awake oriented ambulatory, feels okay for home today.  Discharge Exam: Vitals:   07/04/22 0735 07/05/22 0756  BP: (!) 138/93 (!) 149/88  Pulse: 87 82  Resp: 17 17  Temp: 98.1 F (36.7 C) 98 F (36.7 C)  SpO2: 99% 100%   General: Pt is alert, awake, not in acute distress Cardiovascular: RRR, S1/S2 +, no rubs, no gallops Respiratory: CTA bilaterally, no wheezing, no rhonchi Abdominal: Soft, NT, ND, bowel sounds + Extremities: no edema, no cyanosis  Discharge Instructions  Discharge Instructions     Diet - low sodium heart healthy   Complete by: As directed    Discharge instructions   Complete by: As directed    Fu with ENT MD in 1 wk  Please call call MD or return to ER for  similar or worsening recurring problem that brought you to hospital or if any fever,nausea/vomiting,abdominal pain, uncontrolled pain, chest pain,  shortness of breath or any other alarming symptoms.  Please follow-up your doctor as instructed in a week time and call the office for appointment.  Please avoid alcohol, smoking, or any other illicit substance and maintain  healthy habits including taking your regular medications as prescribed.  You were cared for by a hospitalist during your hospital stay. If you have any questions about your discharge medications or the care you received while you were in the hospital after you are discharged, you can call the unit and ask to speak with the hospitalist on call if the hospitalist that took care of you is not available.  Once you are discharged, your primary care physician will handle any further medical issues. Please note that NO REFILLS for any discharge medications will be authorized once you are discharged, as it is imperative that you return to your primary care physician (or establish a relationship with a primary care physician if you do not have one) for your aftercare needs so that they can reassess your need for medications and monitor your lab values   Discharge wound care:   Complete by: As directed    Dry dressing and keep the wound clean   Increase activity slowly   Complete by: As directed       Allergies as of 07/05/2022   No Known Allergies      Medication List     STOP taking these medications    Contour Next Monitor w/Device Kit   doxycycline 100 MG tablet Commonly known as: VIBRA-TABS   glipiZIDE 10 MG tablet Commonly known as: GLUCOTROL   Mounjaro 5 MG/0.5ML Pen Generic drug: tirzepatide   NovoLIN 70/30 (70-30) 100 UNIT/ML injection Generic drug: insulin NPH-regular Human Replaced by: NovoLIN 70/30 Kwikpen (70-30) 100 UNIT/ML KwikPen   tamsulosin 0.4 MG Caps capsule Commonly known as: FLOMAX   traZODone 50 MG tablet Commonly known as: DESYREL       TAKE these medications    atorvastatin 40 MG tablet Commonly known as: LIPITOR Take 40 mg by mouth at bedtime.   blood glucose meter kit and supplies Kit Dispense based on patient and insurance preference. Use up to four times daily as directed. What changed: additional instructions   cefadroxil 500 MG  capsule Commonly known as: DURICEF Take 2 capsules (1,000 mg total) by mouth 2 (two) times daily for 11 days.   furosemide 20 MG tablet Commonly known as: LASIX Take 1 tablet (20 mg total) by mouth daily. If needed for leg swelling   gabapentin 300 MG capsule Commonly known as: NEURONTIN Take by mouth.   gentamicin cream 0.1 % Commonly known as: GARAMYCIN Apply 1 application. topically 2 (two) times daily.   glucose blood test strip Commonly known as: Contour Next Test Use as instructed What changed: Another medication with the same name was added. Make sure you understand how and when to take each.   True Metrix Blood Glucose Test test strip Generic drug: glucose blood Use as directed What changed: You were already taking a medication with the same name, and this prescription was added. Make sure you understand how and when to take each.   Insulin Syringes (Disposable) U-100 0.5 ML Misc Use as directed for insulin   lisinopril 20 MG tablet Commonly known as: ZESTRIL Take 1 tablet (20 mg total) by mouth daily. To lower blood pressure   Microlet  Lancets Misc 1 each by Subdermal route 3 (three) times daily.   Microlet Next Lancing Device Misc 1 each by Does not apply route 3 (three) times daily.   mupirocin ointment 2 % Commonly known as: BACTROBAN Apply topically 3 (three) times daily.   NovoLIN 70/30 Kwikpen (70-30) 100 UNIT/ML KwikPen Generic drug: insulin isophane & regular human KwikPen Inject 30 Units into the skin 2 (two) times daily with a meal. Replaces: NovoLIN 70/30 (70-30) 100 UNIT/ML injection   TRUEplus Pen Needles 32G X 4 MM Misc Generic drug: Insulin Pen Needle Use to inject insulin daily. What changed: Another medication with the same name was added. Make sure you understand how and when to take each.   Pentips 31G X 8 MM Misc Generic drug: Insulin Pen Needle Use as directed with insulin pen. What changed: You were already taking a medication  with the same name, and this prescription was added. Make sure you understand how and when to take each.   V-R IBU-PROFEN 200 MG tablet Generic drug: ibuprofen Take 2 tablets (400 mg total) by mouth every 6 (six) hours as needed for mild pain or moderate pain. What changed: how much to take               Discharge Care Instructions  (From admission, onward)           Start     Ordered   07/05/22 0000  Discharge wound care:       Comments: Dry dressing and keep the wound clean   07/05/22 1043            Follow-up Information     Cipriano Mile, NP. Schedule an appointment as soon as possible for a visit.   Why: Please call and schedule an appointment in the next week with your primary care physician. Contact information: [place] [address] [place], [place] The Follow up.   Specialty: Home Health Services Why: Home Health agency will call you in the next 1-2 days after discharge to home to start home health services. Contact information: [place] [address] [phone]         Melissa Montane, MD Follow up in 1 week(s).   Specialty: Otolaryngology Contact information: [place] [address] [phone]                No Known Allergies  The results of significant diagnostics from this hospitalization (including imaging, microbiology, ancillary and laboratory) are listed below for reference.    Microbiology: Recent Results (from the past 240 hour(s))  Culture, blood (routine x 2)     Status: None   Collection Time: 06/27/22  4:28 PM   Specimen: BLOOD  Result Value Ref Range Status   Specimen Description BLOOD SITE NOT SPECIFIED  Final   Special Requests   Final    BOTTLES DRAWN AEROBIC AND ANAEROBIC Blood Culture results may not be optimal due to an excessive volume of blood received in culture bottles   Culture   Final    NO GROWTH  5 DAYS Performed at [facility], [place] [address], [place], [place] [address]    Report Status 07/02/2022 FINAL  Final  Culture, blood (routine x 2)     Status: None   Collection Time: 06/27/22  5:11 PM   Specimen: BLOOD  Result Value Ref Range Status  Specimen Description BLOOD BLOOD LEFT HAND  Final   Special Requests   Final    BOTTLES DRAWN AEROBIC ONLY Blood Culture results may not be optimal due to an inadequate volume of blood received in culture bottles   Culture   Final    NO GROWTH 5 DAYS Performed at [facility], [place] [address] [place], [place] [address]    Report Status 07/02/2022 FINAL  Final  Aerobic/Anaerobic Culture w Gram Stain (surgical/deep wound)     Status: None (Preliminary result)   Collection Time: 07/02/22 10:03 AM   Specimen: PATH Other; Tissue  Result Value Ref Range Status   Specimen Description ABSCESS  Final   Special Requests POSTERIOR NECK  Final   Gram Stain   Final    RARE WBC PRESENT, PREDOMINANTLY MONONUCLEAR FEW GRAM POSITIVE COCCI IN PAIRS IN CLUSTERS Performed at [facility], [place] [address], [place], [place] [address]    Culture   Final    MODERATE STAPHYLOCOCCUS AUREUS NO ANAEROBES ISOLATED; CULTURE IN PROGRESS FOR 5 DAYS    Report Status PENDING  Incomplete   Organism ID, Bacteria STAPHYLOCOCCUS AUREUS  Final      Susceptibility   Staphylococcus aureus - MIC*    CIPROFLOXACIN >=8 RESISTANT Resistant     ERYTHROMYCIN <=0.25 SENSITIVE Sensitive     GENTAMICIN <=0.5 SENSITIVE Sensitive     OXACILLIN 0.5 SENSITIVE Sensitive     TETRACYCLINE <=1 SENSITIVE Sensitive     VANCOMYCIN 1 SENSITIVE Sensitive     TRIMETH/SULFA <=10 SENSITIVE Sensitive     CLINDAMYCIN <=0.25 SENSITIVE Sensitive     RIFAMPIN <=0.5 SENSITIVE Sensitive     Inducible Clindamycin NEGATIVE Sensitive     * MODERATE STAPHYLOCOCCUS AUREUS  Surgical pcr screen     Status: Abnormal   Collection Time: 07/02/22 10:23 AM   Specimen: Nasal Mucosa; Nasal  Swab  Result Value Ref Range Status   MRSA, PCR NEGATIVE NEGATIVE Final   Staphylococcus aureus POSITIVE (A) NEGATIVE Final    Comment: (NOTE) The Xpert SA Assay (FDA approved for NASAL specimens in patients 72 years of age and older), is one component of a comprehensive surveillance program. It is not intended to diagnose infection nor to guide or monitor treatment. Performed at [facility], [place] [address], [place], [place] [address]     Procedures/Studies: CT Soft Tissue Neck W Contrast  Result Date: 06/27/2022 CLINICAL DATA:  Provided history: Soft tissue swelling, infection suspected, neck x-ray done; significant abscess to posterior neck-possible tracking wound, rule out underlying cervical osteomyelitis. EXAM: CT NECK WITH CONTRAST TECHNIQUE: Multidetector CT imaging of the neck was performed using the standard protocol following the bolus administration of intravenous contrast. RADIATION DOSE REDUCTION: This exam was performed according to the departmental dose-optimization program which includes automated exposure control, adjustment of the mA and/or kV according to patient size and/or use of iterative reconstruction technique. CONTRAST:  43m OMNIPAQUE IOHEXOL 300 MG/ML  SOLN COMPARISON:  Neck CT 11/30/2017. FINDINGS: Pharynx and larynx: Poor dentition with multiple absent, carious and fractured teeth and multifocal periapical lucencies. No appreciable swelling or mass within the oral cavity, pharynx or larynx. Salivary glands: The right parotid gland appears surgically absent. Unremarkable appearance of the left parotid and bilateral submandibular glands. Thyroid: Unremarkable. Lymph nodes: Bilateral cervical lymphadenopathy, most notably as follows. An enlarged right level 5 lymph node measures 13 mm in short axis (series 5, image 29) (series 3, image 60). An enlarged left level 2/5 lymph  node measures 13 mm in short axis (series 5, image 80) (series 3, image 52). Vascular: The  major vascular structures of the neck are patent. Atherosclerotic plaque within the visualized aortic arch, within the proximal major branch vessels of the neck, about the carotid bifurcations and within the proximal ICAs. Possible hemodynamically significant stenoses within the proximal ICAs. Limited intracranial: No evidence of acute intracranial abnormality within the field of view. Visualized orbits: Incompletely imaged. No orbital mass or acute orbital finding at the imaged levels. Mastoids and visualized paranasal sinuses: Portions of the frontal sinuses are excluded from the field of view superiorly. 8 mm mucous retention cyst within the right maxillary sinus. 12 mm mucous retention cyst within the left maxillary sinus. No significant mastoid effusion. Skeleton: Cervical spondylosis with multilevel disc space narrowing, disc bulges, endplate spurring and uncovertebral hypertrophy. Multilevel spinal canal stenosis. Most notably, disc bulges contribute to apparent moderate spinal canal stenosis at C3-C4 and C4-C5. Multilevel bony neural foraminal narrowing. Multilevel ventrolateral osteophytes. No acute fracture or aggressive osseous lesion. Upper chest: No consolidation within the imaged lung apices. Other: There is extensive edema, inflammatory stranding and infiltration within the subcutaneous fat of the posterior neck (with overlying skin thickening). Findings are compatible with cellulitis and phlegmon. 4 mm peripherally enhancing focus within the subcutaneous fat of the posterior neck, eccentric to the right at the C5 level (series 5, image 26) (series 3, image 51). This is suspicious for a small abscess. IMPRESSION: Extensive cellulitis and phlegmon within the posterior neck soft tissues. Suspected 4 mm abscess within the subcutaneous fat of the posterior neck, eccentric to the right at the C5 level. Associated bilateral cervical lymphadenopathy, as described and likely reactive. No CT evidence of  osteomyelitis within the cervical spine. Cervical spondylosis, as described. Mucous retention cysts within the bilateral maxillary sinuses measuring up to 12 mm. Atherosclerotic plaque within the visualized aortic arch, proximal major branch vessels of the neck and carotid arteries. Possible hemodynamically significant stenosis within the proximal ICAs, bilaterally. Consider a non-emergent carotid artery duplex for further evaluation. Electronically Signed   By: Kellie Simmering D.O.   On: 06/27/2022 10:49    Labs: BNP (last 3 results) No results for input(s): "BNP" in the last 8760 hours. Basic Metabolic Panel: Recent Labs  Lab 07/02/22 0417 07/03/22 0445 07/03/22 1249 07/04/22 0425 07/05/22 0246  NA 134* 139 131* 136 137  K 4.0 5.2* 4.4 4.1 4.2  CL 100 100 97* 102 100  CO2 _0 GLUCOSE 100* 95 214* 96 115*  BUN _1 CREATININE 0.51* 0.71 0.61 0.68 0.72  CALCIUM 8.8* 9.2 8.8* 8.9 9.0   Liver Function Tests: No results for input(s): "AST", "ALT", "ALKPHOS", "BILITOT", "PROT", "ALBUMIN" in the last 168 hours. No results for input(s): "LIPASE", "AMYLASE" in the last 168 hours. No results for input(s): "AMMONIA" in the last 168 hours. CBC: Recent Labs  Lab 07/01/22 0303 07/02/22 0417 07/03/22 0445 07/04/22 0425 07/05/22 0246  WBC 11.1* 9.8 8.2 8.1 8.3  HGB 12.4* 12.6* 12.3* 13.1 12.4*  HCT 36.6* 38.7* 38.4* 41.1 37.7*  MCV 83.6 84.1 85.5 85.6 84.0  PLT 258 326 294 308 318   Cardiac Enzymes: No results for input(s): "CKTOTAL", "CKMB", "CKMBINDEX", "TROPONINI" in the last 168 hours. BNP: Invalid input(s): "POCBNP" CBG: Recent Labs  Lab 07/04/22 1146 07/04/22 1623 07/04/22 2212 07/04/22 2352 07/05/22 0737  GLUCAP 125* 132* 64* 122* 98   D-Dimer No results for input(s): "DDIMER"  in the last 72 hours. Hgb A1c No results for input(s): "HGBA1C" in the last 72 hours. Lipid Profile No results for input(s): "CHOL", "HDL", "LDLCALC", "TRIG", "CHOLHDL",  "LDLDIRECT" in the last 72 hours. Thyroid function studies No results for input(s): "TSH", "T4TOTAL", "T3FREE", "THYROIDAB" in the last 72 hours.  Invalid input(s): "FREET3" Anemia work up No results for input(s): "VITAMINB12", "FOLATE", "FERRITIN", "TIBC", "IRON", "RETICCTPCT" in the last 72 hours. Urinalysis    Component Value Date/Time   COLORURINE YELLOW 12/01/2017 0129   APPEARANCEUR CLEAR 12/01/2017 0129   LABSPEC >1.046 (H) 12/01/2017 0129   PHURINE 6.0 12/01/2017 0129   GLUCOSEU >=500 (A) 12/01/2017 0129   HGBUR NEGATIVE 12/01/2017 0129   BILIRUBINUR NEGATIVE 12/01/2017 0129   KETONESUR 5 (A) 12/01/2017 0129   PROTEINUR 30 (A) 12/01/2017 0129   NITRITE NEGATIVE 12/01/2017 0129   LEUKOCYTESUR NEGATIVE 12/01/2017 0129   Sepsis Labs Recent Labs  Lab 07/02/22 0417 07/03/22 0445 07/04/22 0425 07/05/22 0246  WBC 9.8 8.2 8.1 8.3   Microbiology Recent Results (from the past 240 hour(s))  Culture, blood (routine x 2)     Status: None   Collection Time: 06/27/22  4:28 PM   Specimen: BLOOD  Result Value Ref Range Status   Specimen Description BLOOD SITE NOT SPECIFIED  Final   Special Requests   Final    BOTTLES DRAWN AEROBIC AND ANAEROBIC Blood Culture results may not be optimal due to an excessive volume of blood received in culture bottles   Culture   Final    NO GROWTH 5 DAYS Performed at [facility], [place] [address] [place], [place] [address]    Report Status 07/02/2022 FINAL  Final  Culture, blood (routine x 2)     Status: None   Collection Time: 06/27/22  5:11 PM   Specimen: BLOOD  Result Value Ref Range Status   Specimen Description BLOOD BLOOD LEFT HAND  Final   Special Requests   Final    BOTTLES DRAWN AEROBIC ONLY Blood Culture results may not be optimal due to an inadequate volume of blood received in culture bottles   Culture   Final    NO GROWTH 5 DAYS Performed at [facility], [place] [address] [place], [place] [address]    Report Status  07/02/2022 FINAL  Final  Aerobic/Anaerobic Culture w Gram Stain (surgical/deep wound)     Status: None (Preliminary result)   Collection Time: 07/02/22 10:03 AM   Specimen: PATH Other; Tissue  Result Value Ref Range Status   Specimen Description ABSCESS  Final   Special Requests POSTERIOR NECK  Final   Gram Stain   Final    RARE WBC PRESENT, PREDOMINANTLY MONONUCLEAR FEW GRAM POSITIVE COCCI IN PAIRS IN CLUSTERS Performed at [facility], [place] [address], [place],  [address]    Culture   Final    MODERATE STAPHYLOCOCCUS AUREUS NO ANAEROBES ISOLATED; CULTURE IN PROGRESS FOR 5 DAYS    Report Status PENDING  Incomplete   Organism ID, Bacteria STAPHYLOCOCCUS AUREUS  Final      Susceptibility   Staphylococcus aureus - MIC*    CIPROFLOXACIN >=8 RESISTANT Resistant     ERYTHROMYCIN <=0.25 SENSITIVE Sensitive     GENTAMICIN <=0.5 SENSITIVE Sensitive     OXACILLIN 0.5 SENSITIVE Sensitive     TETRACYCLINE <=1 SENSITIVE Sensitive     VANCOMYCIN 1 SENSITIVE Sensitive     TRIMETH/SULFA <=10 SENSITIVE Sensitive     CLINDAMYCIN <=0.25 SENSITIVE Sensitive  RIFAMPIN <=0.5 SENSITIVE Sensitive     Inducible Clindamycin NEGATIVE Sensitive     * MODERATE STAPHYLOCOCCUS AUREUS  Surgical pcr screen     Status: Abnormal   Collection Time: 07/02/22 10:23 AM   Specimen: Nasal Mucosa; Nasal Swab  Result Value Ref Range Status   MRSA, PCR NEGATIVE NEGATIVE Final   Staphylococcus aureus POSITIVE (A) NEGATIVE Final    Comment: (NOTE) The Xpert SA Assay (FDA approved for NASAL specimens in patients 58 years of age and older), is one component of a comprehensive surveillance program. It is not intended to diagnose infection nor to guide or monitor treatment. Performed at [facility], [place] [address], [place], [place] [address]      Time coordinating discharge: 35 minutes  SIGNED: Antonieta Pert, MD  [facility] 07/05/2022, 10:59 AM  If 7PM-7AM, please contact  night-coverage [email or web address]

## 2022-07-05 NOTE — Progress Notes (Signed)
Subjective: Reports feeling better. Still has some posterior neck tenderness.  Objective: Vital signs in last 24 hours: Temp:  [98 F (36.7 C)] 98 F (36.7 C) (08/29 0756) Pulse Rate:  [82] 82 (08/29 0756) Resp:  [17] 17 (08/29 0756) BP: (149)/(88) 149/88 (08/29 0756) SpO2:  [100 %] 100 % (08/29 0756)  General appearance: alert and cooperative Head: Normocephalic, without obvious abnormality, atraumatic Eyes: Pupils are equal, round, reactive to light. Extraocular motion is intact.  Ears: Examination of the ears shows normal auricles and external auditory canals bilaterally.  Nose: Nasal examination shows normal mucosa, septum, turbinates.  Face: Facial examination shows no asymmetry. Palpation of the face elicit no significant tenderness.  Mouth: Oral cavity examination shows no mucosal injury. No significant trismus is noted.  Neck: Posterior neck drainage site is healing well. No purulent drainage Neuro: Cranial nerves 2-12 are all grossly in tact.   Recent Labs    07/04/22 0425 07/05/22 0246  WBC 8.1 8.3  HGB 13.1 12.4*  HCT 41.1 37.7*  PLT 308 318   Recent Labs    07/04/22 0425 07/05/22 0246  NA 136 137  K 4.1 4.2  CL 102 100  CO2 28 28  GLUCOSE 96 115*  BUN 10 14  CREATININE 0.68 0.72  CALCIUM 8.9 9.0    Medications: I have reviewed the patient's current medications. Scheduled:  cefadroxil  1,000 mg Oral BID   enoxaparin (LOVENOX) injection  40 mg Subcutaneous Q24H   insulin aspart  0-15 Units Subcutaneous TID WC   insulin aspart  0-5 Units Subcutaneous QHS   insulin aspart protamine- aspart  30 Units Subcutaneous BID WC   mupirocin ointment   Topical TID   Continuous:  sodium chloride 50 mL/hr at 07/02/22 1742   [id number] **OR** acetaminophen, hydrALAZINE, ondansetron **OR** ondansetron (ZOFRAN) IV, oxyCODONE  Assessment/Plan: Posterior neck abscess, s/p drainage by Dr. Jearld Fenton. - May d/c home with oral abx. - Change dressing daily. -  Follow up in 1 week.   LOS: 8 days   Jasman Pfeifle W Cierrah Dace 07/05/2022, 11:10 AM

## 2022-07-05 NOTE — Inpatient Diabetes Management (Signed)
Inpatient Diabetes Program Recommendations  AACE/ADA: New Consensus Statement on Inpatient Glycemic Control (2015)  Target Ranges:  Prepandial:   less than 140 mg/dL      Peak postprandial:   less than 180 mg/dL (1-2 hours)      Critically ill patients:  140 - 180 mg/dL   Lab Results  Component Value Date   GLUCAP 98 07/05/2022   HGBA1C 13.4 (H) 06/27/2022    Review of Glycemic Control  Latest Reference Range & Units 07/04/22 07:39 07/04/22 11:46 07/04/22 16:23 07/04/22 22:12 07/04/22 23:52 07/05/22 07:37  Glucose-Capillary 70 - 99 mg/dL 585 (H) 929 (H) Novolog 2 units 132 (H) Novolog 2 units 64 (L) 122 (H) 98   Diabetes history: DM2 Outpatient Diabetes medications: Glipizide 10 BID, 70/30 25 BID Current orders for Inpatient glycemic control: 70/30 30 units bid, Novolog 0-15 units tid, 0-5 units hs  Inpatient Diabetes Program Recommendations:   -Decrease Novolog correction to 0-9 units tid, 0-5 units hs  Thank you, Darel Hong E. Arkie Tagliaferro, RN, MSN, CDE  Diabetes Coordinator Inpatient Glycemic Control Team Team Pager [phone] (8am-5pm) 07/05/2022 8:20 AM

## 2022-07-05 NOTE — Progress Notes (Signed)
Explained discharge instructions. Reviewed necessity to schedule appointments with his pcp and ENT Md. Also reviewed next medication administration times as well as provided education on TOC meds. Patient's friend was present to educate for patient's post hospital care. Patient's IV was removed accidentally by the patient prior to my explaining the discharge instructions/ All belongings are in the patient's care to include the patient's TOC medications. No additional needs were verbalized. Transported down for discharge.

## 2022-07-07 LAB — AEROBIC/ANAEROBIC CULTURE W GRAM STAIN (SURGICAL/DEEP WOUND)

## 2022-07-12 ENCOUNTER — Telehealth: Payer: Self-pay | Admitting: Podiatry

## 2022-07-12 NOTE — Telephone Encounter (Signed)
Home health needs a verbal order for social work.   Please advise.

## 2022-07-12 NOTE — Telephone Encounter (Signed)
What's the name of the home health, what is the verbal order for?

## 2022-07-12 NOTE — Telephone Encounter (Signed)
I have no idea.  Last visit states that I am seeing him annually now and his wound has healed.  Perhaps he got our office mixed up with another physician's office.  Thanks, Dr. Logan Bores

## 2022-07-13 ENCOUNTER — Telehealth: Payer: Self-pay | Admitting: *Deleted

## 2022-07-13 NOTE — Telephone Encounter (Signed)
Nurse w/ [name] is calling to request a sooner appointment for patient, went out today for visit, his ulcer on right foot has changed,larger scab area, worse since last week, callus has softened, hurting really bad, blistered. Scheduled tomorrow w/ Dr Allena Katz @9 :15.

## 2022-07-14 ENCOUNTER — Ambulatory Visit (INDEPENDENT_AMBULATORY_CARE_PROVIDER_SITE_OTHER): Payer: Medicare PPO

## 2022-07-14 ENCOUNTER — Ambulatory Visit (INDEPENDENT_AMBULATORY_CARE_PROVIDER_SITE_OTHER): Payer: Medicare PPO | Admitting: Podiatry

## 2022-07-14 DIAGNOSIS — L97512 Non-pressure chronic ulcer of other part of right foot with fat layer exposed: Secondary | ICD-10-CM

## 2022-07-14 DIAGNOSIS — T148XXA Other injury of unspecified body region, initial encounter: Secondary | ICD-10-CM | POA: Diagnosis not present

## 2022-07-14 DIAGNOSIS — L539 Erythematous condition, unspecified: Secondary | ICD-10-CM

## 2022-07-14 MED ORDER — DOXYCYCLINE HYCLATE 100 MG PO TABS: 100.0000 mg | ORAL_TABLET | Freq: Two times a day (BID) | ORAL | 0 refills | Status: AC

## 2022-07-14 NOTE — Progress Notes (Signed)
Subjective:  Patient ID: Jeffrey Murphy, male    DOB: 01/04/1956,  MRN: [id number]  Chief Complaint  Patient presents with   Foot Ulcer    66 y.o. male presents with the above complaint.  Patient presents with right submetatarsal 2 blister formation with underlying ulceration.  Patient states that he is a diabetic and he started developing blister ration a little while ago and is progressive gotten worse.  He does not know how the friction blister started.  States it started to bother him.  He is not on antibiotics he wanted to get it evaluated right away.  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  He is a high risk of losing the leg   Review of Systems: Negative except as noted in the HPI. Denies N/V/F/Ch.  Past Medical History:  Diagnosis Date   Abscess 12/2016   LEFT INGUINAL ABSCESS   Arthritis    Diabetes mellitus without complication (HCC)    Hypertension    Wears glasses    reading    Current Outpatient Medications:    doxycycline (VIBRA-TABS) 100 MG tablet, Take 1 tablet (100 mg total) by mouth 2 (two) times daily., Disp: 60 tablet, Rfl: 0   atorvastatin (LIPITOR) 40 MG tablet, Take 40 mg by mouth at bedtime. (Patient not taking: Reported on 06/27/2022), Disp: , Rfl:    blood glucose meter kit and supplies KIT, Dispense based on patient and insurance preference. Use up to four times daily as directed., Disp: 1 each, Rfl: 0   furosemide (LASIX) 20 MG tablet, Take 1 tablet (20 mg total) by mouth daily. If needed for leg swelling (Patient not taking: Reported on 06/27/2022), Disp: 30 tablet, Rfl: 3   gabapentin (NEURONTIN) 300 MG capsule, Take by mouth. (Patient not taking: Reported on 06/27/2022), Disp: , Rfl:    gentamicin cream (GARAMYCIN) 0.1 %, Apply 1 application. topically 2 (two) times daily. (Patient not taking: Reported on 06/27/2022), Disp: 15 g, Rfl: 1   glucose blood (CONTOUR NEXT TEST) test strip, Use as instructed, Disp:  100 each, Rfl: 11   glucose blood (TRUE METRIX BLOOD GLUCOSE TEST) test strip, Use as directed, Disp: 100 each, Rfl: 0   ibuprofen (ADVIL) 200 MG tablet, Take 2 tablets (400 mg total) by mouth every 6 (six) hours as needed for mild pain or moderate pain., Disp: 30 tablet, Rfl: 0   insulin isophane & regular human KwikPen (NOVOLIN 70/30 KWIKPEN) (70-30) 100 UNIT/ML KwikPen, Inject 30 Units into the skin 2 (two) times daily with a meal., Disp: 18 mL, Rfl: 0   Insulin Pen Needle (TRUEPLUS PEN NEEDLES) 32G X 4 MM MISC, Use to inject insulin daily., Disp: 100 each, Rfl: 11   Insulin Pen Needle 31G X 8 MM MISC, Use as directed with insulin pen., Disp: 100 each, Rfl: 0   Insulin Syringes, Disposable, U-100 0.5 ML MISC, Use as directed for insulin (Patient not taking: Reported on 05/29/2019), Disp: 100 each, Rfl: 6   Lancet Devices (MICROLET NEXT LANCING DEVICE) MISC, 1 each by Does not apply route 3 (three) times daily. (Patient not taking: Reported on 05/29/2019), Disp: 1 each, Rfl: 12   lisinopril (ZESTRIL) 20 MG tablet, Take 1 tablet (20 mg total) by mouth daily. To lower blood pressure (Patient not taking: Reported on 06/27/2022), Disp: 90 tablet, Rfl: 0   Microlet Lancets MISC, 1 each by Subdermal route 3 (three) times daily., Disp: 100 each, Rfl:   12   mupirocin ointment (BACTROBAN) 2 %, Apply topically 3 (three) times daily., Disp: 22 g, Rfl: 0  Social History   Tobacco Use  Smoking Status Never  Smokeless Tobacco Never    No Known Allergies Objective:  There were no vitals filed for this visit. There is no height or weight on file to calculate BMI. Constitutional Well developed. Well nourished.  Vascular Dorsalis pedis pulses palpable bilaterally. Posterior tibial pulses palpable bilaterally. Capillary refill normal to all digits.  No cyanosis or clubbing noted. Pedal hair growth normal.  Neurologic Normal speech. Oriented to person, place, and time. Epicritic sensation to light touch  grossly present bilaterally.  Dermatologic Right submetatarsal 2 friction blister without purulent drainage underlying ulceration with fat layer exposed noted.  Erythema noted circumferential around the blister.  No other abnormalities identified.  Orthopedic: Normal joint ROM without pain or crepitus bilaterally. No visible deformities. No bony tenderness.   Radiographs: N 3 views of skeletally mature adult:No signs of osteomyelitis noted.  No bony abnormalities identified.  Severe bunion deformity and hallux valgus deformity noted.  Severe hammertoe contractures noted. Assessment:   1. 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 Friction blister    Plan:  Patient was evaluated and treated and all questions answered.  Right submetatarsal 2 friction blister with underlying ulceration with fat layer exposed -All questions and concerns were discussed with the patient in extensive detail -I drained the blister which did not have any purulent drainage.  There is some erythema present.  I discussed with him do Betadine wet-to-dry dressing on daily basis -He will be wearing surgical shoe to take the pressure off of the forefoot. -Doxycycline was sent to the pharmacy for skin and soft tissue prophylaxis -He is at high risk of worsening infection.  If that happens have asked him to go to the emergency room he states understanding  No follow-ups on file.  Right submetatarsal 2 blister with underlying ulceration fat layer exposed Betadine wet-to-dry surgical shoe with doxycycline 

## 2022-07-18 ENCOUNTER — Telehealth: Payer: Self-pay

## 2022-07-18 ENCOUNTER — Ambulatory Visit (INDEPENDENT_AMBULATORY_CARE_PROVIDER_SITE_OTHER): Payer: Medicare PPO | Admitting: Otolaryngology

## 2022-07-18 DIAGNOSIS — L0211 Cutaneous abscess of neck: Secondary | ICD-10-CM

## 2022-07-18 NOTE — Progress Notes (Signed)
Jeffrey Murphy is an 66 y.o. male.   Chief Complaint: neck infection HPI: History of large wound infection in the posterior neck that was drained.  He now is doing well.  No longer has any pain.  He still has an open wound.  He is out of his antibiotics.  Past Medical History:  Diagnosis Date   Abscess 12/2016   LEFT INGUINAL ABSCESS   Arthritis    Diabetes mellitus without complication (HCC)    Hypertension    Wears glasses    reading    Past Surgical History:  Procedure Laterality Date   COLONOSCOPY     CYST REMOVAL NECK     INCISION AND DRAINAGE ABSCESS N/A 07/02/2022   Procedure: INCISION AND DRAINAGE ABSCESS;  Surgeon: Suzanna Obey, MD;  Location: [facility] OR;  Service: ENT;  Laterality: N/A;   INGUINAL HERNIA REPAIR     right   IRRIGATION AND DEBRIDEMENT ABSCESS Left 12/12/2016   Procedure: IRRIGATION AND DEBRIDEMENT ABSCESS;  Surgeon: Almond Lint, MD;  Location: [facility] OR;  Service: General;  Laterality: Left;   JOINT REPLACEMENT     right total knee   PILONIDAL CYST EXCISION     back   QUADRICEPS TENDON REPAIR Right 12/24/2014   Procedure: RIGHT QUADRICEP TENDON REPAIR ;  Surgeon: Cheral Almas, MD;  Location: [facility];  Service: Orthopedics;  Laterality: Right;   REPLACEMENT TOTAL KNEE      Family History  Problem Relation Age of Onset   Cancer Mother    Hypertension Mother    Diabetes Father    Social History:  reports that he has never smoked. He has never used smokeless tobacco. He reports that he does not currently use alcohol. He reports current drug use. Frequency: 5.00 times per week. Drugs: Marijuana, Cocaine, and "Crack" cocaine.  Allergies: No Known Allergies  (Not in a hospital admission)   No results found for this or any previous visit (from the past 48 hour(s)). No results found.   There were no vitals taken for this visit.  PHYSICAL EXAM: Appearance _ awake alert with no distress.  Head- atraumatic and no obvious  abnormalities Eyes- PERRL, EOMI, no conjunctiva injection or ecchymosis Ears-  Right- Pinna without inflammation or swelling. canal without obstruction or injury. TM within normal limits  Left- Pinna without inflammation or swelling. canal without obstruction or injury. TM within normal limits Oc/OP- no lesions or excessive swelling. Mouth opening normal.  Hp/Larynx- normal voice and no airway issues. No swelling or lesions Neck-the wound in the back of the neck is completely without tenderness.  It has a hole in the neck where the incision was made and some exudative material that was debrided off.  There is good blood flow.  The wound was probed with gauze.  The wound does not appear to be infected at this point..  Neuro- CNII-XII intact, no sensory deficits.  Lungs- normal effort no distress noted     Assessment/Plan Wound infection-I the wound was packed with gauze with saline.  Home health is already seeing him and will call us and change the dressing tomorrow.Marland Kitchen  He will follow-up on this Thursday to check the wound again.  At this point it is a wound care issue and he does not need more antibiotics from me but he is on antibiotics from a foot infection.  Suzanna Obey 07/18/2022, 10:17 AM

## 2022-07-18 NOTE — Telephone Encounter (Signed)
Faxed [facility] with wound supply request with confirmed receipt. 

## 2022-07-19 ENCOUNTER — Telehealth: Payer: Self-pay

## 2022-07-19 NOTE — Telephone Encounter (Signed)
Faxed wound care supply request to [facility] with confirmed receipt.   Per [facility]: Service has been provided for the patient; no further action is required.

## 2022-07-20 ENCOUNTER — Ambulatory Visit: Payer: Medicare PPO | Admitting: Podiatry

## 2022-07-21 ENCOUNTER — Ambulatory Visit (INDEPENDENT_AMBULATORY_CARE_PROVIDER_SITE_OTHER): Payer: Medicare PPO | Admitting: Otolaryngology

## 2022-07-21 DIAGNOSIS — L0211 Cutaneous abscess of neck: Secondary | ICD-10-CM

## 2022-07-21 NOTE — Progress Notes (Addendum)
Jeffrey Murphy is an 66 y.o. male.   Chief Complaint: wound infection HPI: doing well and changing packing daily  Past Medical History:  Diagnosis Date   Abscess 12/2016   LEFT INGUINAL ABSCESS   Arthritis    Diabetes mellitus without complication (HCC)    Hypertension    Wears glasses    reading    Past Surgical History:  Procedure Laterality Date   COLONOSCOPY     CYST REMOVAL NECK     INCISION AND DRAINAGE ABSCESS N/A 07/02/2022   Procedure: INCISION AND DRAINAGE ABSCESS;  Surgeon: Suzanna Obey, MD;  Location: [facility] OR;  Service: ENT;  Laterality: N/A;   INGUINAL HERNIA REPAIR     right   IRRIGATION AND DEBRIDEMENT ABSCESS Left 12/12/2016   Procedure: IRRIGATION AND DEBRIDEMENT ABSCESS;  Surgeon: Almond Lint, MD;  Location: [facility] OR;  Service: General;  Laterality: Left;   JOINT REPLACEMENT     right total knee   PILONIDAL CYST EXCISION     back   QUADRICEPS TENDON REPAIR Right 12/24/2014   Procedure: RIGHT QUADRICEP TENDON REPAIR ;  Surgeon: Cheral Almas, MD;  Location: [facility];  Service: Orthopedics;  Laterality: Right;   REPLACEMENT TOTAL KNEE      Family History  Problem Relation Age of Onset   Cancer Mother    Hypertension Mother    Diabetes Father    Social History:  reports that he has never smoked. He has never used smokeless tobacco. He reports that he does not currently use alcohol. He reports current drug use. Frequency: 5.00 times per week. Drugs: Marijuana, Cocaine, and "Crack" cocaine.  Allergies: No Known Allergies  (Not in a hospital admission)   No results found for this or any previous visit (from the past 48 hour(s)). No results found.   There were no vitals taken for this visit.  PHYSICAL EXAM: Appearance _ awake alert with no distress.  Head- atraumatic and no obvious abnormalities Eyes- PERRL, EOMI, no conjunctiva injection or ecchymosis Ears-  Right- Pinna without inflammation or swelling. canal without obstruction or  injury. TM within normal limits  Left- Pinna without inflammation or swelling. canal without obstruction or injury. TM within normal limits Oc/OP- no lesions or excessive swelling. Mouth opening normal.  Hp/Larynx- normal voice and no airway issues. No swelling or lesions Neck- no mass or lesions. Normal movement. The wound is looking good. The granulation tissue is forming. There were a few areas superiorly that were debrided of necrotic tissue with good bleeding. Saline gauze replaced. Neuro- CNII-XII intact, no sensory deficits.  Lungs- normal effort no distress noted      Assessment/Plan Wound posterior neck- he is healing well. Continue saline gauze changes by wife and nursing at home. Follow up on Monday.   He came back bleeding from the wound. The wound was explored in office removing clot. The area of bleeding was identified at the site of the debridement and the vessel was clamped. It was cauterized on the hemastat that stopped it. It was cleaned up and gauze placed into the wound. He will leave it in place until Monday   Go to ER if any further bleeding.   Suzanna Obey 07/21/2022, 9:48 AM

## 2022-07-25 ENCOUNTER — Ambulatory Visit (INDEPENDENT_AMBULATORY_CARE_PROVIDER_SITE_OTHER): Payer: Medicare PPO | Admitting: Otolaryngology

## 2022-07-25 DIAGNOSIS — L0211 Cutaneous abscess of neck: Secondary | ICD-10-CM

## 2022-07-25 NOTE — Progress Notes (Signed)
Jeffrey Murphy is an 66 y.o. male.   Chief Complaint: wound HPI: he is doing well. No pain. No further bleeding nurse still coming out but didn't change the current packing.   Past Medical History:  Diagnosis Date   Abscess 12/2016   LEFT INGUINAL ABSCESS   Arthritis    Diabetes mellitus without complication ([place])    Hypertension    Wears glasses    reading    Past Surgical History:  Procedure Laterality Date   COLONOSCOPY     CYST REMOVAL NECK     INCISION AND DRAINAGE ABSCESS N/A 07/02/2022   Procedure: INCISION AND DRAINAGE ABSCESS;  Surgeon: Melissa Montane, MD;  Location: [facility];  Service: ENT;  Laterality: N/A;   INGUINAL HERNIA REPAIR     right   IRRIGATION AND DEBRIDEMENT ABSCESS Left 12/12/2016   Procedure: IRRIGATION AND DEBRIDEMENT ABSCESS;  Surgeon: Stark Klein, MD;  Location: [facility];  Service: General;  Laterality: Left;   JOINT REPLACEMENT     right total knee   PILONIDAL CYST EXCISION     back   QUADRICEPS TENDON REPAIR Right 12/24/2014   Procedure: RIGHT QUADRICEP TENDON REPAIR ;  Surgeon: Marianna Payment, MD;  Location: [facility];  Service: Orthopedics;  Laterality: Right;   REPLACEMENT TOTAL KNEE      Family History  Problem Relation Age of Onset   Cancer Mother    Hypertension Mother    Diabetes Father    Social History:  reports that he has never smoked. He has never used smokeless tobacco. He reports that he does not currently use alcohol. He reports current drug use. Frequency: 5.00 times per week. Drugs: Marijuana, Cocaine, and "Crack" cocaine.  Allergies: No Known Allergies  (Not in a hospital admission)   No results found for this or any previous visit (from the past 48 hour(s)). No results found.   There were no vitals taken for this visit.  PHYSICAL EXAM: Appearance _ awake alert with no distress.  Head- atraumatic and no obvious abnormalities Eyes- PERRL, EOMI, no conjunctiva injection or ecchymosis Ears-  Right- Pinna  without inflammation or swelling. canal without obstruction or injury. TM within normal limits  Left- Pinna without inflammation or swelling. canal without obstruction or injury. TM within normal limits Oc/OP- no lesions or excessive swelling. Mouth opening normal.  Hp/Larynx- normal voice and no airway issues. No swelling or lesions Neck- wound has no bleeding and there is some healing  packing removed and replaced with loose gauze. Some granulation tissue Neuro- CNII-XII intact, no sensory deficits.  Lungs- normal effort no distress noted     Assessment/Plan Wound abscess- he will continue packing changes now with the nurse and follow up in 2 weeks. He will come back sooner if any worsening of the area with pain,swelling, fever, or redness.   Melissa Montane 07/25/2022,

## 2022-08-01 ENCOUNTER — Ambulatory Visit (INDEPENDENT_AMBULATORY_CARE_PROVIDER_SITE_OTHER): Payer: Medicare PPO | Admitting: Physician Assistant

## 2022-08-01 DIAGNOSIS — L0211 Cutaneous abscess of neck: Secondary | ICD-10-CM

## 2022-08-01 NOTE — Progress Notes (Signed)
   Referring Provider No referring provider defined for this encounter.   CC:  Chief Complaint  Patient presents with   Follow-up      Jeffrey Murphy is an 66 y.o. male.  HPI:  Jeffrey Murphy returns today for follow up on his neck wound. He denies infectious signs or symptoms. No pain or fever. He notes that he is changing his dressing but admits that he cannot accomplish this every day. He continues to take antibiotics for a foot infection.   Review of Systems General: negative for fever  Physical Exam    07/05/2022    7:56 AM 07/04/2022    7:35 AM 07/04/2022    5:00 AM  Vitals with BMI  Weight   175 lbs 15 oz  BMI   16.10  Systolic 960 454   Diastolic 88 93   Pulse 82 87     General:  No acute distress,  Alert and oriented, Non-Toxic, Normal speech and affect Wound to posterior neck down to level of muscle, wound edges are clean, surrounding redness, chronic skin changes noted. No bleeding. Neck supple with full ROM   Assessment/Plan Jeffrey Murphy is doing well, his wound is clean, there is no bleeding or signs of infection. He should continue wound dressing with saline soaked gauzed placed in the wound daily. I did instruct him to reach out to [place] if he is unable to complete this. We would like to see him back in the clinic in two weeks for follow up evaluation, sooner as needed. Strict return precautions given.   [name] 08/01/2022, 10:22 AM

## 2022-08-01 NOTE — Progress Notes (Signed)
foll

## 2022-08-03 ENCOUNTER — Ambulatory Visit (INDEPENDENT_AMBULATORY_CARE_PROVIDER_SITE_OTHER): Payer: Medicare PPO | Admitting: Podiatry

## 2022-08-03 DIAGNOSIS — T148XXA Other injury of unspecified body region, initial encounter: Secondary | ICD-10-CM

## 2022-08-03 DIAGNOSIS — L97512 Non-pressure chronic ulcer of other part of right foot with fat layer exposed: Secondary | ICD-10-CM

## 2022-08-03 NOTE — Progress Notes (Signed)
Subjective:  Patient ID: Jeffrey Murphy, male    DOB: 1956-01-28,  MRN: [id number]  Chief Complaint  Patient presents with   Foot Ulcer    66 y.o. male presents with the above complaint.  Presents for follow-up of right submetatarsal 2 blister formation.  He states is doing a lot better Betadine helped considerably.  Denies any other acute complaints he feels a lot better.  Review of Systems: Negative except as noted in the HPI. Denies N/V/F/Ch.  Past Medical History:  Diagnosis Date   Abscess 12/2016   LEFT INGUINAL ABSCESS   Arthritis    Diabetes mellitus without complication ([place])    Hypertension    Wears glasses    reading    Current Outpatient Medications:    atorvastatin (LIPITOR) 40 MG tablet, Take 40 mg by mouth at bedtime., Disp: , Rfl:    blood glucose meter kit and supplies KIT, Dispense based on patient and insurance preference. Use up to four times daily as directed., Disp: 1 each, Rfl: 0   doxycycline (VIBRA-TABS) 100 MG tablet, Take 1 tablet (100 mg total) by mouth 2 (two) times daily., Disp: 60 tablet, Rfl: 0   furosemide (LASIX) 20 MG tablet, Take 1 tablet (20 mg total) by mouth daily. If needed for leg swelling, Disp: 30 tablet, Rfl: 3   gabapentin (NEURONTIN) 300 MG capsule, Take by mouth., Disp: , Rfl:    gentamicin cream (GARAMYCIN) 0.1 %, Apply 1 application. topically 2 (two) times daily., Disp: 15 g, Rfl: 1   glucose blood (CONTOUR NEXT TEST) test strip, Use as instructed, Disp: 100 each, Rfl: 11   glucose blood (TRUE METRIX BLOOD GLUCOSE TEST) test strip, Use as directed, Disp: 100 each, Rfl: 0   ibuprofen (ADVIL) 200 MG tablet, Take 2 tablets (400 mg total) by mouth every 6 (six) hours as needed for mild pain or moderate pain., Disp: 30 tablet, Rfl: 0   insulin isophane & regular human KwikPen (NOVOLIN 70/30 KWIKPEN) (70-30) 100 UNIT/ML KwikPen, Inject 30 Units into the skin 2 (two) times daily with a meal., Disp: 18 mL, Rfl: 0   Insulin Pen Needle  (TRUEPLUS PEN NEEDLES) 32G X 4 MM MISC, Use to inject insulin daily., Disp: 100 each, Rfl: 11   Insulin Pen Needle 31G X 8 MM MISC, Use as directed with insulin pen., Disp: 100 each, Rfl: 0   Insulin Syringes, Disposable, U-100 0.5 ML MISC, Use as directed for insulin, Disp: 100 each, Rfl: 6   Lancet Devices (MICROLET NEXT LANCING DEVICE) MISC, 1 each by Does not apply route 3 (three) times daily., Disp: 1 each, Rfl: 12   lisinopril (ZESTRIL) 20 MG tablet, Take 1 tablet (20 mg total) by mouth daily. To lower blood pressure, Disp: 90 tablet, Rfl: 0   Microlet Lancets MISC, 1 each by Subdermal route 3 (three) times daily., Disp: 100 each, Rfl: 12   mupirocin ointment (BACTROBAN) 2 %, Apply topically 3 (three) times daily., Disp: 22 g, Rfl: 0  Social History   Tobacco Use  Smoking Status Never  Smokeless Tobacco Never    No Known Allergies Objective:  There were no vitals filed for this visit. There is no height or weight on file to calculate BMI. Constitutional Well developed. Well nourished.  Vascular Dorsalis pedis pulses palpable bilaterally. Posterior tibial pulses palpable bilaterally. Capillary refill normal to all digits.  No cyanosis or clubbing noted. Pedal hair growth normal.  Neurologic Normal speech. Oriented to person, place, and time. Epicritic sensation  to light touch grossly present bilaterally.  Dermatologic Right submetatarsal 2 friction blister without purulent drainage underlying ulceration with fat layer exposed noted.  Erythema noted circumferential around the blister.  No other abnormalities identified.  Orthopedic: Normal joint ROM without pain or crepitus bilaterally. No visible deformities. No bony tenderness.   Radiographs: N 3 views of skeletally mature adult:No signs of osteomyelitis noted.  No bony abnormalities identified.  Severe bunion deformity and hallux valgus deformity noted.  Severe hammertoe contractures noted. Assessment:   No diagnosis  found.  Plan:  Patient was evaluated and treated and all questions answered.  Right submetatarsal 2 friction blister with underlying ulceration with fat layer exposed -All questions and concerns were discussed with the patient in extensive detail -Clinically healed and completely reepithelialized I discussed shoe gear modification and offloading techniques.

## 2022-08-15 ENCOUNTER — Ambulatory Visit (INDEPENDENT_AMBULATORY_CARE_PROVIDER_SITE_OTHER): Payer: Medicare PPO | Admitting: Otolaryngology

## 2022-10-24 ENCOUNTER — Ambulatory Visit
Admission: EM | Admit: 2022-10-24 | Discharge: 2022-10-24 | Discharge status: Against Medical Advice | Payer: Medicare PPO

## 2022-10-24 NOTE — ED Triage Notes (Signed)
Pt presents with cough and congestion for over a week.

## 2023-06-09 ENCOUNTER — Ambulatory Visit: Payer: Medicare PPO | Admitting: Podiatry

## 2023-06-14 ENCOUNTER — Other Ambulatory Visit: Payer: Self-pay

## 2023-06-14 ENCOUNTER — Ambulatory Visit: Payer: Medicare PPO | Admitting: Podiatry

## 2023-06-14 DIAGNOSIS — E118 Type 2 diabetes mellitus with unspecified complications: Secondary | ICD-10-CM | POA: Diagnosis not present

## 2023-06-14 DIAGNOSIS — L97512 Non-pressure chronic ulcer of other part of right foot with fat layer exposed: Secondary | ICD-10-CM

## 2023-06-14 MED ORDER — DOXYCYCLINE HYCLATE 100 MG PO TABS
100.0000 mg | ORAL_TABLET | Freq: Two times a day (BID) | ORAL | 0 refills | Status: DC
  Filled 2023-06-14: qty 60, 30d supply, fill #0

## 2023-06-14 MED ORDER — DOXYCYCLINE HYCLATE 100 MG PO TABS: 100.0000 mg | ORAL_TABLET | Freq: Two times a day (BID) | ORAL | 0 refills | Status: DC

## 2023-06-14 NOTE — Progress Notes (Signed)
Subjective:  Patient ID: Jeffrey Murphy, male    DOB: April 26, 1956,  MRN: [id number]  Chief Complaint  Patient presents with   Foot Ulcer    67 y.o. male presents with the above complaint.  Patient presents for right submetatarsal 2 ulceration recurrence.  Patient states that his ulcer started coming back wanted to get it evaluated.  He states started getting swollen he is not antibiotics denies any other acute complaints   Review of Systems: Negative except as noted in the HPI. Denies N/V/F/Ch.  Past Medical History:  Diagnosis Date   Abscess 12/2016   LEFT INGUINAL ABSCESS   Arthritis    Diabetes mellitus without complication (HCC)    Hypertension    Wears glasses    reading    Current Outpatient Medications:    doxycycline (VIBRA-TABS) 100 MG tablet, Take 1 tablet (100 mg total) by mouth 2 (two) times daily., Disp: 60 tablet, Rfl: 0   atorvastatin (LIPITOR) 40 MG tablet, Take 40 mg by mouth at bedtime., Disp: , Rfl:    blood glucose meter kit and supplies KIT, Dispense based on patient and insurance preference. Use up to four times daily as directed., Disp: 1 each, Rfl: 0   furosemide (LASIX) 20 MG tablet, Take 1 tablet (20 mg total) by mouth daily. If needed for leg swelling, Disp: 30 tablet, Rfl: 3   gabapentin (NEURONTIN) 300 MG capsule, Take by mouth., Disp: , Rfl:    gentamicin cream (GARAMYCIN) 0.1 %, Apply 1 application. topically 2 (two) times daily., Disp: 15 g, Rfl: 1   glucose blood (CONTOUR NEXT TEST) test strip, Use as instructed, Disp: 100 each, Rfl: 11   glucose blood (TRUE METRIX BLOOD GLUCOSE TEST) test strip, Use as directed, Disp: 100 each, Rfl: 0   insulin isophane & regular human KwikPen (NOVOLIN 70/30 KWIKPEN) (70-30) 100 UNIT/ML KwikPen, Inject 30 Units into the skin 2 (two) times daily with a meal., Disp: 18 mL, Rfl: 0   Insulin Pen Needle (TRUEPLUS PEN NEEDLES) 32G X 4 MM MISC, Use to inject insulin daily., Disp: 100 each, Rfl: 11   Insulin Syringes,  Disposable, U-100 0.5 ML MISC, Use as directed for insulin, Disp: 100 each, Rfl: 6   Lancet Devices (MICROLET NEXT LANCING DEVICE) MISC, 1 each by Does not apply route 3 (three) times daily., Disp: 1 each, Rfl: 12   lisinopril (ZESTRIL) 20 MG tablet, Take 1 tablet (20 mg total) by mouth daily. To lower blood pressure, Disp: 90 tablet, Rfl: 0   Microlet Lancets MISC, 1 each by Subdermal route 3 (three) times daily., Disp: 100 each, Rfl: 12   mupirocin ointment (BACTROBAN) 2 %, Apply topically 3 (three) times daily., Disp: 22 g, Rfl: 0  Social History   Tobacco Use  Smoking Status Never  Smokeless Tobacco Never    No Known Allergies Objective:  There were no vitals filed for this visit. There is no height or weight on file to calculate BMI. Constitutional Well developed. Well nourished.  Vascular Dorsalis pedis pulses palpable bilaterally. Posterior tibial pulses palpable bilaterally. Capillary refill normal to all digits.  No cyanosis or clubbing noted. Pedal hair growth normal.  Neurologic Normal speech. Oriented to person, place, and time. Epicritic sensation to light touch grossly present bilaterally.  Dermatologic Right submetatarsal 2 friction blister without purulent drainage underlying ulceration with fat layer exposed noted.  Erythema noted circumferential around the blister.  No other abnormalities identified.  Orthopedic: Normal joint ROM without pain or crepitus bilaterally. No  visible deformities. No bony tenderness.   Radiographs: N 3 views of skeletally mature adult:No signs of osteomyelitis noted.  No bony abnormalities identified.  Severe bunion deformity and hallux valgus deformity noted.  Severe hammertoe contractures noted. Assessment:   No diagnosis found.  Plan:  Patient was evaluated and treated and all questions answered.  Right submetatarsal 2 friction blister with underlying ulceration with fat layer exposed -All questions and concerns were discussed  with the patient in extensive detail -I drained the blister which did not have any purulent drainage.  There is some erythema present.  I discussed with him do Betadine wet-to-dry dressing on daily basis -He will be wearing surgical shoe to take the pressure off of the forefoot. -Doxycycline was sent to the pharmacy for skin and soft tissue prophylaxis -He is at high risk of worsening infection.  If that happens have asked him to go to the emergency room he states understanding  No follow-ups on file.

## 2023-06-18 ENCOUNTER — Encounter (HOSPITAL_COMMUNITY): Payer: Self-pay

## 2023-06-18 ENCOUNTER — Ambulatory Visit (HOSPITAL_COMMUNITY)
Admission: EM | Admit: 2023-06-18 | Discharge: 2023-06-18 | Disposition: A | Discharge status: Home / Self Care | Payer: Medicare PPO | Attending: Family Medicine | Admitting: Family Medicine

## 2023-06-18 DIAGNOSIS — E11628 Type 2 diabetes mellitus with other skin complications: Secondary | ICD-10-CM | POA: Diagnosis not present

## 2023-06-18 DIAGNOSIS — E118 Type 2 diabetes mellitus with unspecified complications: Secondary | ICD-10-CM | POA: Diagnosis not present

## 2023-06-18 DIAGNOSIS — L089 Local infection of the skin and subcutaneous tissue, unspecified: Secondary | ICD-10-CM | POA: Diagnosis not present

## 2023-06-18 MED ORDER — INSULIN NPH ISOPHANE & REGULAR (70-30) 100 UNIT/ML ~~LOC~~ SUSP
30.0000 [IU] | Freq: Two times a day (BID) | SUBCUTANEOUS | 1 refills | Status: AC
Start: 2023-06-18 — End: ?

## 2023-06-18 NOTE — ED Triage Notes (Signed)
Patient reports that he ran out of insulin and when he went to [facility] he was told that they did not have any available. Patient takes Novolin 70/30.  Patient also reports that he had an infected right foot callus removed 4 days ago and states he has been changing his bandages, but wants his right foot checked. To make sure he does not have an infection.  Patient states he has a follow up appointment with his podiatrist on 07/04/23.

## 2023-06-18 NOTE — Discharge Instructions (Signed)
Please go to the emergency room for further evaluation and treatment for your foot infection

## 2023-06-18 NOTE — ED Notes (Signed)
Patient is being discharged from the Urgent Care and sent to the Emergency Department via POV . Per Dr. Marlinda Mike, patient is in need of higher level of care due to a foot infection. Patient is aware and verbalizes understanding of plan of care.  Vitals:   06/18/23 1428  BP: 135/82  Pulse: 100  Resp: 16  Temp: 98.3 F (36.8 C)  SpO2: 93%

## 2023-06-18 NOTE — ED Provider Notes (Signed)
[facility]    CSN: [id number] Arrival date & time: 06/18/23  1405      History   Chief Complaint Chief Complaint  Patient presents with   Medication Refill    HPI Jeffrey Murphy is a 67 y.o. male.    Medication Refill Here for diabetes.  He usually gets Novolin 70/30 without a prescription at [facility].  When he went today to get more it they told him it was on backorder for them and they did not know when they would get it.  He contacted [facility] who said they did not do any insulins without a prescription.  He would like me to print a prescription so he can find a pharmacy that has it on hand.  Also his right foot has been hurting.  On August 7 he saw podiatry and had a blister opened up then.  Doxycycline was sent in more as a preventative.  He states that the foot is not as swollen as it was but it is still swollen some.  He thinks the toes are maybe newly swollen on his right foot.  He has been doing dressing changes with Betadine as instructed by podiatry.  No fever or chills   He does not check his sugars  He does have primary care but he is going today   Past Medical History:  Diagnosis Date   Abscess 12/2016   LEFT INGUINAL ABSCESS   Arthritis    Diabetes mellitus without complication (HCC)    Hypertension    Wears glasses    reading    Patient Active Problem List   Diagnosis Date Noted   Folliculitis    Abscess of neck 06/27/2022   Hyponatremia with increased serum osmolality 06/27/2022   Inguinal abscess 12/12/2016   Diabetes mellitus with complication (HCC) 12/12/2016   Sepsis (HCC) 12/12/2016   Drug abuse (HCC)    Hyperglycemia    GERD (gastroesophageal reflux disease) 04/19/2016   Hypertension 04/19/2016   Anxiety 04/19/2016   Rupture of right quadriceps tendon 12/24/2014   Quadriceps tendon rupture 12/24/2014    Past Surgical History:  Procedure Laterality Date   COLONOSCOPY     CYST REMOVAL NECK     INCISION AND DRAINAGE  ABSCESS N/A 07/02/2022   Procedure: INCISION AND DRAINAGE ABSCESS;  Surgeon: Suzanna Obey, MD;  Location: [facility] OR;  Service: ENT;  Laterality: N/A;   INGUINAL HERNIA REPAIR     right   IRRIGATION AND DEBRIDEMENT ABSCESS Left 12/12/2016   Procedure: IRRIGATION AND DEBRIDEMENT ABSCESS;  Surgeon: Almond Lint, MD;  Location: [facility] OR;  Service: General;  Laterality: Left;   JOINT REPLACEMENT     right total knee   PILONIDAL CYST EXCISION     back   QUADRICEPS TENDON REPAIR Right 12/24/2014   Procedure: RIGHT QUADRICEP TENDON REPAIR ;  Surgeon: Cheral Almas, MD;  Location: [facility];  Service: Orthopedics;  Laterality: Right;   REPLACEMENT TOTAL KNEE         Home Medications    Prior to Admission medications   Medication Sig Start Date End Date Taking? Authorizing Provider  insulin NPH-regular Human (70-30) 100 UNIT/ML injection Inject 30 Units into the skin 2 (two) times daily with a meal. 06/18/23  Yes Averil Digman, Janace Aris, MD  atorvastatin (LIPITOR) 40 MG tablet Take 40 mg by mouth at bedtime. 02/02/22   [provider]  blood glucose meter kit and supplies KIT Dispense based on patient and insurance preference.  Use up to four times daily as directed. 07/05/22   Lanae Boast, MD  doxycycline (VIBRA-TABS) 100 MG tablet Take 1 tablet (100 mg total) by mouth 2 (two) times daily. 06/14/23 07/14/23  Candelaria Stagers, DPM  furosemide (LASIX) 20 MG tablet Take 1 tablet (20 mg total) by mouth daily. If needed for leg swelling 07/24/19   Fulp, Cammie, MD  gabapentin (NEURONTIN) 300 MG capsule Take by mouth. 03/07/22   [provider]  gentamicin cream (GARAMYCIN) 0.1 % Apply 1 application. topically 2 (two) times daily. 03/16/22   [name], DPM  glucose blood (CONTOUR NEXT TEST) test strip Use as instructed 04/15/19   Fulp, Cammie, MD  glucose blood (TRUE METRIX BLOOD GLUCOSE TEST) test strip Use as directed 06/29/22   Dorcas Carrow, MD  Insulin Pen Needle (TRUEPLUS PEN NEEDLES)  32G X 4 MM MISC Use to inject insulin daily. 05/29/19   Fulp, Cammie, MD  Insulin Syringes, Disposable, U-100 0.5 ML MISC Use as directed for insulin 12/13/18   Fulp, Cammie, MD  Lancet Devices (MICROLET NEXT LANCING DEVICE) MISC 1 each by Does not apply route 3 (three) times daily. 04/15/19   Fulp, Cammie, MD  lisinopril (ZESTRIL) 20 MG tablet Take 1 tablet (20 mg total) by mouth daily. To lower blood pressure 07/24/19   Fulp, Cammie, MD  Microlet Lancets MISC 1 each by Subdermal route 3 (three) times daily. 04/15/19   Cain Saupe, MD    Family History Family History  Problem Relation Age of Onset   Cancer Mother    Hypertension Mother    Diabetes Father     Social History Social History   Tobacco Use   Smoking status: Never   Smokeless tobacco: Never  Vaping Use   Vaping status: Never Used  Substance Use Topics   Alcohol use: Not Currently    Comment: drinks every other day   Drug use: Not Currently    Frequency: 5.0 times per week    Types: Marijuana, Cocaine, "Crack" cocaine    Comment: last use months     Allergies   Patient has no known allergies.   Review of Systems Review of Systems   Physical Exam Triage Vital Signs ED Triage Vitals  Encounter Vitals Group     BP 06/18/23 1428 135/82     Systolic BP Percentile --      Diastolic BP Percentile --      Pulse Rate 06/18/23 1428 100     Resp 06/18/23 1428 16     Temp 06/18/23 1428 98.3 F (36.8 C)     Temp Source 06/18/23 1428 Oral     SpO2 06/18/23 1428 93 %     Weight --      Height --      Head Circumference --      Peak Flow --      Pain Score 06/18/23 1431 7     Pain Loc --      Pain Education --      Exclude from Growth Chart --    No data found.  Updated Vital Signs BP 135/82 (BP Location: Left Arm)   Pulse 100   Temp 98.3 F (36.8 C) (Oral)   Resp 16   SpO2 93%   Visual Acuity Right Eye Distance:   Left Eye Distance:   Bilateral Distance:    Right Eye Near:   Left Eye Near:     Bilateral Near:     Physical Exam Vitals  reviewed.  Constitutional:      General: He is not in acute distress.    Appearance: He is not toxic-appearing or diaphoretic.     Comments: He is chronically ill-appearing.  There is a 6-week smell in the room.  HENT:     Mouth/Throat:     Mouth: Mucous membranes are moist.  Musculoskeletal:     Comments: His right ankle is mildly swollen as is the dorsum of his right foot near the toes.  There is erythema and swelling of his second and third toes on the right foot.  On examination of the sole of the foot there is at ulceration about half centimeter deep with some white adherent material at the base.  There is some swelling of the tissues around the ulceration the ulceration is approximately 2.5 cm wide  Skin:    Coloration: Skin is not pale.  Neurological:     General: No focal deficit present.     Mental Status: He is alert and oriented to person, place, and time.  Psychiatric:        Behavior: Behavior normal.      UC Treatments / Results  Labs (all labs ordered are listed, but only abnormal results are displayed) Labs Reviewed - No data to display  EKG   Radiology No results found.  Procedures Procedures (including critical care time)  Medications Ordered in UC Medications - No data to display  Initial Impression / Assessment and Plan / UC Course  I have reviewed the triage vital signs and the nursing notes.  Pertinent labs & imaging results that were available during my care of the patient were reviewed by me and considered in my medical decision making (see chart for details).        Bandage is reapplied for the patient.  He stated he was told to use Betadine and so I did that today.  I have asked him to proceed to the emergency room for further evaluation and explained that I think he has a worsening diabetic foot infection.  Insulin prescription is printed and provided for him   Final Clinical Impressions(s) /  UC Diagnoses   Final diagnoses:  Diabetes mellitus type 2 with complications [facility])  Diabetic foot infection [facility])     Discharge Instructions      Please go to the emergency room for further evaluation and treatment for your foot infection       ED Prescriptions     Medication Sig Dispense Auth. Provider   insulin NPH-regular Human (70-30) 100 UNIT/ML injection Inject 30 Units into the skin 2 (two) times daily with a meal. 10 mL Bodi Palmeri, Janace Aris, MD      PDMP not reviewed this encounter.   Zenia Resides, MD 06/18/23 [id number]

## 2023-06-20 ENCOUNTER — Inpatient Hospital Stay (HOSPITAL_COMMUNITY)
Admission: EM | Admit: 2023-06-20 | Discharge: 2023-06-29 | DRG: 240 | Disposition: A | Discharge status: Skilled Nursing Facility | Payer: Medicare PPO | Source: Ambulatory Visit | Attending: Internal Medicine | Admitting: Internal Medicine

## 2023-06-20 ENCOUNTER — Encounter (HOSPITAL_COMMUNITY): Payer: Self-pay

## 2023-06-20 ENCOUNTER — Inpatient Hospital Stay (HOSPITAL_COMMUNITY): Payer: Medicare PPO

## 2023-06-20 ENCOUNTER — Other Ambulatory Visit: Payer: Self-pay

## 2023-06-20 ENCOUNTER — Emergency Department (HOSPITAL_COMMUNITY): Payer: Medicare PPO

## 2023-06-20 DIAGNOSIS — E785 Hyperlipidemia, unspecified: Secondary | ICD-10-CM | POA: Diagnosis present

## 2023-06-20 DIAGNOSIS — L97512 Non-pressure chronic ulcer of other part of right foot with fat layer exposed: Secondary | ICD-10-CM | POA: Diagnosis present

## 2023-06-20 DIAGNOSIS — Z888 Allergy status to other drugs, medicaments and biological substances status: Secondary | ICD-10-CM | POA: Diagnosis not present

## 2023-06-20 DIAGNOSIS — E11628 Type 2 diabetes mellitus with other skin complications: Secondary | ICD-10-CM | POA: Diagnosis present

## 2023-06-20 DIAGNOSIS — Z23 Encounter for immunization: Secondary | ICD-10-CM | POA: Diagnosis present

## 2023-06-20 DIAGNOSIS — E1152 Type 2 diabetes mellitus with diabetic peripheral angiopathy with gangrene: Secondary | ICD-10-CM | POA: Diagnosis present

## 2023-06-20 DIAGNOSIS — Z809 Family history of malignant neoplasm, unspecified: Secondary | ICD-10-CM | POA: Diagnosis not present

## 2023-06-20 DIAGNOSIS — M60073 Infective myositis, right foot: Secondary | ICD-10-CM | POA: Diagnosis present

## 2023-06-20 DIAGNOSIS — M869 Osteomyelitis, unspecified: Secondary | ICD-10-CM

## 2023-06-20 DIAGNOSIS — L02611 Cutaneous abscess of right foot: Secondary | ICD-10-CM | POA: Diagnosis present

## 2023-06-20 DIAGNOSIS — Z79899 Other long term (current) drug therapy: Secondary | ICD-10-CM | POA: Diagnosis not present

## 2023-06-20 DIAGNOSIS — E1165 Type 2 diabetes mellitus with hyperglycemia: Secondary | ICD-10-CM | POA: Diagnosis present

## 2023-06-20 DIAGNOSIS — M79671 Pain in right foot: Secondary | ICD-10-CM | POA: Diagnosis present

## 2023-06-20 DIAGNOSIS — F419 Anxiety disorder, unspecified: Secondary | ICD-10-CM | POA: Diagnosis not present

## 2023-06-20 DIAGNOSIS — I10 Essential (primary) hypertension: Secondary | ICD-10-CM | POA: Diagnosis present

## 2023-06-20 DIAGNOSIS — M1990 Unspecified osteoarthritis, unspecified site: Secondary | ICD-10-CM | POA: Diagnosis present

## 2023-06-20 DIAGNOSIS — Z96651 Presence of right artificial knee joint: Secondary | ICD-10-CM | POA: Diagnosis present

## 2023-06-20 DIAGNOSIS — M86171 Other acute osteomyelitis, right ankle and foot: Secondary | ICD-10-CM | POA: Diagnosis not present

## 2023-06-20 DIAGNOSIS — E11621 Type 2 diabetes mellitus with foot ulcer: Secondary | ICD-10-CM | POA: Diagnosis present

## 2023-06-20 DIAGNOSIS — I70261 Atherosclerosis of native arteries of extremities with gangrene, right leg: Secondary | ICD-10-CM | POA: Diagnosis present

## 2023-06-20 DIAGNOSIS — M009 Pyogenic arthritis, unspecified: Secondary | ICD-10-CM | POA: Diagnosis present

## 2023-06-20 DIAGNOSIS — Z8249 Family history of ischemic heart disease and other diseases of the circulatory system: Secondary | ICD-10-CM

## 2023-06-20 DIAGNOSIS — E1169 Type 2 diabetes mellitus with other specified complication: Secondary | ICD-10-CM | POA: Diagnosis present

## 2023-06-20 DIAGNOSIS — Z833 Family history of diabetes mellitus: Secondary | ICD-10-CM | POA: Diagnosis not present

## 2023-06-20 DIAGNOSIS — Z794 Long term (current) use of insulin: Secondary | ICD-10-CM

## 2023-06-20 DIAGNOSIS — L089 Local infection of the skin and subcutaneous tissue, unspecified: Secondary | ICD-10-CM | POA: Diagnosis not present

## 2023-06-20 LAB — CBC WITH DIFFERENTIAL/PLATELET
Abs Immature Granulocytes: 0.06 10*3/uL (ref 0.00–0.07)
Basophils Absolute: 0 10*3/uL (ref 0.0–0.1)
Basophils Relative: 0 %
Eosinophils Absolute: 0.3 10*3/uL (ref 0.0–0.5)
Eosinophils Relative: 2 %
HCT: 44.1 % (ref 39.0–52.0)
Hemoglobin: 14.6 g/dL (ref 13.0–17.0)
Immature Granulocytes: 0 %
Lymphocytes Relative: 9 %
Lymphs Abs: 1.4 10*3/uL (ref 0.7–4.0)
MCH: 28 pg (ref 26.0–34.0)
MCHC: 33.1 g/dL (ref 30.0–36.0)
MCV: 84.5 fL (ref 80.0–100.0)
Monocytes Absolute: 0.9 10*3/uL (ref 0.1–1.0)
Monocytes Relative: 6 %
Neutro Abs: 13 10*3/uL — ABNORMAL HIGH (ref 1.7–7.7)
Neutrophils Relative %: 83 %
Platelets: 192 10*3/uL (ref 150–400)
RBC: 5.22 MIL/uL (ref 4.22–5.81)
RDW: 13.3 % (ref 11.5–15.5)
WBC: 15.6 10*3/uL — ABNORMAL HIGH (ref 4.0–10.5)
nRBC: 0 % (ref 0.0–0.2)

## 2023-06-20 LAB — I-STAT CG4 LACTIC ACID, ED
Lactic Acid, Venous: 1 mmol/L (ref 0.5–1.9)
Lactic Acid, Venous: 2.2 mmol/L (ref 0.5–1.9)

## 2023-06-20 LAB — COMPREHENSIVE METABOLIC PANEL
ALT: 10 U/L (ref 0–44)
AST: 12 U/L — ABNORMAL LOW (ref 15–41)
Albumin: 3.5 g/dL (ref 3.5–5.0)
Alkaline Phosphatase: 75 U/L (ref 38–126)
Anion gap: 12 (ref 5–15)
BUN: 24 mg/dL — ABNORMAL HIGH (ref 8–23)
CO2: 27 mmol/L (ref 22–32)
Calcium: 9.4 mg/dL (ref 8.9–10.3)
Chloride: 96 mmol/L — ABNORMAL LOW (ref 98–111)
Creatinine, Ser: 0.93 mg/dL (ref 0.61–1.24)
GFR, Estimated: >60 mL/min (ref >60)
Glucose, Bld: 203 mg/dL — ABNORMAL HIGH (ref 70–99)
Potassium: 4 mmol/L (ref 3.5–5.1)
Sodium: 135 mmol/L (ref 135–145)
Total Bilirubin: 1.7 mg/dL — ABNORMAL HIGH (ref 0.3–1.2)
Total Protein: 8.2 g/dL — ABNORMAL HIGH (ref 6.5–8.1)

## 2023-06-20 LAB — URINALYSIS, ROUTINE W REFLEX MICROSCOPIC
Bilirubin Urine: NEGATIVE
Glucose, UA: 50 mg/dL — AB
Hgb urine dipstick: NEGATIVE
Ketones, ur: NEGATIVE mg/dL
Leukocytes,Ua: NEGATIVE
Nitrite: NEGATIVE
Protein, ur: NEGATIVE mg/dL
Specific Gravity, Urine: 1.026 (ref 1.005–1.030)
pH: 5 (ref 5.0–8.0)

## 2023-06-20 LAB — SEDIMENTATION RATE: Sed Rate: 53 mm/hr — ABNORMAL HIGH (ref 0–16)

## 2023-06-20 LAB — RAPID URINE DRUG SCREEN, HOSP PERFORMED
Amphetamines: NOT DETECTED
Barbiturates: NOT DETECTED
Benzodiazepines: NOT DETECTED
Cocaine: POSITIVE — AB
Opiates: NOT DETECTED
Tetrahydrocannabinol: NOT DETECTED

## 2023-06-20 LAB — C-REACTIVE PROTEIN: CRP: 23.8 mg/dL — ABNORMAL HIGH (ref <1.0)

## 2023-06-20 LAB — GLUCOSE, CAPILLARY: Glucose-Capillary: 187 mg/dL — ABNORMAL HIGH (ref 70–99)

## 2023-06-20 MED ORDER — PNEUMOCOCCAL 20-VAL CONJ VACC 0.5 ML IM SUSY
0.5000 mL | PREFILLED_SYRINGE | INTRAMUSCULAR | Status: AC
  Administered 2023-06-26: 0.5 mL via INTRAMUSCULAR
  Filled 2023-06-20: qty 0.5

## 2023-06-20 MED ORDER — GADOBUTROL 1 MMOL/ML IV SOLN
9.0000 mL | Freq: Once | INTRAVENOUS | Status: AC | PRN
  Administered 2023-06-20: 9 mL via INTRAVENOUS

## 2023-06-20 MED ORDER — ALBUTEROL SULFATE (2.5 MG/3ML) 0.083% IN NEBU: 2.5000 mg | INHALATION_SOLUTION | RESPIRATORY_TRACT | Status: DC | PRN

## 2023-06-20 MED ORDER — VANCOMYCIN HCL 1500 MG/300ML IV SOLN
1500.0000 mg | Freq: Once | INTRAVENOUS | Status: AC
  Administered 2023-06-20: 1500 mg via INTRAVENOUS
  Filled 2023-06-20: qty 300

## 2023-06-20 MED ORDER — ONDANSETRON HCL 4 MG/2ML IJ SOLN: 4.0000 mg | Freq: Four times a day (QID) | INTRAMUSCULAR | Status: DC | PRN

## 2023-06-20 MED ORDER — SODIUM CHLORIDE 0.9 % IV SOLN
2.0000 g | INTRAVENOUS | Status: DC
  Administered 2023-06-20: 2 g via INTRAVENOUS
  Filled 2023-06-20: qty 20

## 2023-06-20 MED ORDER — OXYCODONE-ACETAMINOPHEN 5-325 MG PO TABS
1.0000 | ORAL_TABLET | Freq: Once | ORAL | Status: AC
  Administered 2023-06-20: 1 via ORAL
  Filled 2023-06-20: qty 1

## 2023-06-20 MED ORDER — TRAZODONE HCL 50 MG PO TABS
25.0000 mg | ORAL_TABLET | Freq: Once | ORAL | Status: AC
  Administered 2023-06-20: 25 mg via ORAL
  Filled 2023-06-20: qty 1

## 2023-06-20 MED ORDER — GABAPENTIN 300 MG PO CAPS
300.0000 mg | ORAL_CAPSULE | Freq: Once | ORAL | Status: AC
  Administered 2023-06-20: 300 mg via ORAL
  Filled 2023-06-20: qty 1

## 2023-06-20 MED ORDER — OXYCODONE HCL 5 MG PO TABS
5.0000 mg | ORAL_TABLET | ORAL | Status: DC | PRN
  Administered 2023-06-20 – 2023-06-22 (×7): 5 mg via ORAL
  Filled 2023-06-20 (×7): qty 1

## 2023-06-20 MED ORDER — INSULIN ASPART PROT & ASPART (70-30 MIX) 100 UNIT/ML ~~LOC~~ SUSP
30.0000 [IU] | Freq: Two times a day (BID) | SUBCUTANEOUS | Status: DC
  Administered 2023-06-21: 30 [IU] via SUBCUTANEOUS
  Filled 2023-06-20: qty 10

## 2023-06-20 MED ORDER — HEPARIN SODIUM (PORCINE) 5000 UNIT/ML IJ SOLN
5000.0000 [IU] | Freq: Three times a day (TID) | INTRAMUSCULAR | Status: DC
  Administered 2023-06-20 – 2023-06-29 (×24): 5000 [IU] via SUBCUTANEOUS
  Filled 2023-06-20 (×24): qty 1

## 2023-06-20 MED ORDER — INSULIN ASPART 100 UNIT/ML IJ SOLN
0.0000 [IU] | Freq: Three times a day (TID) | INTRAMUSCULAR | Status: DC
  Administered 2023-06-21: 2 [IU] via SUBCUTANEOUS
  Administered 2023-06-21: 5 [IU] via SUBCUTANEOUS
  Administered 2023-06-22: 2 [IU] via SUBCUTANEOUS
  Administered 2023-06-22: 11 [IU] via SUBCUTANEOUS

## 2023-06-20 MED ORDER — SODIUM CHLORIDE 0.9 % IV SOLN: INTRAVENOUS | Status: AC

## 2023-06-20 MED ORDER — ATORVASTATIN CALCIUM 40 MG PO TABS
40.0000 mg | ORAL_TABLET | Freq: Every day | ORAL | Status: DC
  Administered 2023-06-20 – 2023-06-28 (×9): 40 mg via ORAL
  Filled 2023-06-20 (×9): qty 1

## 2023-06-20 MED ORDER — VANCOMYCIN HCL IN DEXTROSE 1-5 GM/200ML-% IV SOLN
1000.0000 mg | Freq: Two times a day (BID) | INTRAVENOUS | Status: DC
  Administered 2023-06-21 – 2023-06-25 (×9): 1000 mg via INTRAVENOUS
  Filled 2023-06-20 (×12): qty 200

## 2023-06-20 MED ORDER — ONDANSETRON HCL 4 MG PO TABS: 4.0000 mg | ORAL_TABLET | Freq: Four times a day (QID) | ORAL | Status: DC | PRN

## 2023-06-20 MED ORDER — METRONIDAZOLE 500 MG/100ML IV SOLN
500.0000 mg | Freq: Two times a day (BID) | INTRAVENOUS | Status: DC
  Administered 2023-06-20 – 2023-06-26 (×11): 500 mg via INTRAVENOUS
  Filled 2023-06-20 (×11): qty 100

## 2023-06-20 NOTE — Progress Notes (Signed)
Pharmacy Antibiotic Note  Jeffrey Murphy is a 67 y.o. male admitted on 06/20/2023 with right foot infection. She was on doxycycline PTA. Pharmacy has been consulted for vancomycin dosing; ceftriaxone and metronidazole.  He is afebrile, WBC are elevated, and renal function is at baseline.   Plan: Vancomycin 1500 mg IV load then 1000 mg IV q12h Goal AUC 400-550, eAUC 473.6, SCr used 0.93, Vd 0.72 Monitor renal function, clinical progress, cultures/sensitivities F/U LOT and de-escalate as able Vancomycin levels as clinically indicated     Temp (24hrs), Avg:98.6 F (37 C), Min:98.6 F (37 C), Max:98.7 F (37.1 C)  Recent Labs  Lab 06/20/23 1330 06/20/23 1335 06/20/23 1703  WBC 15.6*  --   --   CREATININE 0.93  --   --   LATICACIDVEN  --  1.0 2.2*    CrCl cannot be calculated (Unknown ideal weight.).    No Known Allergies  Antimicrobials this admission: Vancomycin 8/13 >>  Ceftriaxone 8/13 >>  Metronidazole 8/13 >>  Dose adjustments this admission:   Microbiology results: 8/13 BCx: pending   Thank you for involving pharmacy in this patient's care.  Loura Back, PharmD, BCPS Clinical Pharmacist Clinical phone for 06/20/2023 is [phone] 06/20/2023 7:39 PM

## 2023-06-20 NOTE — H&P (Addendum)
History and Physical    Jeffrey Murphy HCW:[id number] DOB: June 08, 1956 DOA: 06/20/2023  PCP: [name], NP  Patient coming from: home  I have personally briefly reviewed patient's old medical records in [facility]  Chief Complaint: swollen right foot  HPI: Jeffrey Murphy is a 67 y.o. male with medical history significant of  DMII, Hypertension , who has interim history of Right submetatarsal 2 friction blister with underlying ulceration with fat layer exposed s/p  drainage of blister on 8/7 s/p which patient was given doxycycline for skin and soft tissue infection prophylaxis.  Patient notes around 48 hours ago he noted increase redness swelling,drain and odor from area. He presents to ED due to persistent swelling with concern for infected ulcer. Patient notes no fever/n/v/d but states he had mild chills. He denies any sob/cough / chest pain or other complaints  ED Course: Vitals:afeb, bp121/87, hr 109 rr 16 sat 975  Labs Lactic 2.2  ESR 53, crp 23.8 Na 135, K 4, Cl 96, bicarb 27, cr 0.93 Wbc 15.6, hgb 14.6,  plt 192 RLE xray:IMPRESSION: 1. No acute bone abnormality to the right foot. 2. Hallux valgus deformity.  Tx oxycodone. Ceftriaxone   Review of Systems: As per HPI otherwise 10 point review of systems negative.   Past Medical History:  Diagnosis Date   Abscess 12/2016   LEFT INGUINAL ABSCESS   Arthritis    Diabetes mellitus without complication (HCC)    Hypertension    Wears glasses    reading    Past Surgical History:  Procedure Laterality Date   COLONOSCOPY     CYST REMOVAL NECK     INCISION AND DRAINAGE ABSCESS N/A 07/02/2022   Procedure: INCISION AND DRAINAGE ABSCESS;  Surgeon: Suzanna Obey, MD;  Location: [facility] OR;  Service: ENT;  Laterality: N/A;   INGUINAL HERNIA REPAIR     right   IRRIGATION AND DEBRIDEMENT ABSCESS Left 12/12/2016   Procedure: IRRIGATION AND DEBRIDEMENT ABSCESS;  Surgeon: Almond Lint, MD;  Location: [facility] OR;  Service: General;   Laterality: Left;   JOINT REPLACEMENT     right total knee   PILONIDAL CYST EXCISION     back   QUADRICEPS TENDON REPAIR Right 12/24/2014   Procedure: RIGHT QUADRICEP TENDON REPAIR ;  Surgeon: Cheral Almas, MD;  Location: [facility];  Service: Orthopedics;  Laterality: Right;   REPLACEMENT TOTAL KNEE       reports that he has never smoked. He has never used smokeless tobacco. He reports that he does not currently use alcohol. He reports that he does not currently use drugs after having used the following drugs: Marijuana, Cocaine, and "Crack" cocaine. Frequency: 5.00 times per week.  No Known Allergies  Family History  Problem Relation Age of Onset   Cancer Mother    Hypertension Mother    Diabetes Father     Prior to Admission medications   Medication Sig Start Date End Date Taking? Authorizing Provider  atorvastatin (LIPITOR) 40 MG tablet Take 40 mg by mouth at bedtime. 02/02/22   [provider]  blood glucose meter kit and supplies KIT Dispense based on patient and insurance preference. Use up to four times daily as directed. 07/05/22   Lanae Boast, MD  doxycycline (VIBRA-TABS) 100 MG tablet Take 1 tablet (100 mg total) by mouth 2 (two) times daily. 06/14/23 07/14/23  Candelaria Stagers, DPM  furosemide (LASIX) 20 MG tablet Take 1 tablet (20 mg total) by mouth daily. If  needed for leg swelling 07/24/19   Fulp, Cammie, MD  gabapentin (NEURONTIN) 300 MG capsule Take by mouth. 03/07/22   [provider]  gentamicin cream (GARAMYCIN) 0.1 % Apply 1 application. topically 2 (two) times daily. 03/16/22   [name], DPM  glucose blood (CONTOUR NEXT TEST) test strip Use as instructed 04/15/19   Fulp, Cammie, MD  glucose blood (TRUE METRIX BLOOD GLUCOSE TEST) test strip Use as directed 06/29/22   Dorcas Carrow, MD  insulin NPH-regular Human (70-30) 100 UNIT/ML injection Inject 30 Units into the skin 2 (two) times daily with a meal. 06/18/23   Banister, Janace Aris, MD   Insulin Pen Needle (TRUEPLUS PEN NEEDLES) 32G X 4 MM MISC Use to inject insulin daily. 05/29/19   Fulp, Cammie, MD  Insulin Syringes, Disposable, U-100 0.5 ML MISC Use as directed for insulin 12/13/18   Fulp, Cammie, MD  Lancet Devices (MICROLET NEXT LANCING DEVICE) MISC 1 each by Does not apply route 3 (three) times daily. 04/15/19   Fulp, Cammie, MD  lisinopril (ZESTRIL) 20 MG tablet Take 1 tablet (20 mg total) by mouth daily. To lower blood pressure 07/24/19   Fulp, Cammie, MD  Microlet Lancets MISC 1 each by Subdermal route 3 (three) times daily. 04/15/19   Cain Saupe, MD    Physical Exam: Vitals:   06/20/23 1313  BP: 121/87  Pulse: (!) 109  Resp: 14  Temp: 98.7 F (37.1 C)  TempSrc: Oral  SpO2: 97%    Constitutional: NAD, calm, comfortable Vitals:   06/20/23 1313  BP: 121/87  Pulse: (!) 109  Resp: 14  Temp: 98.7 F (37.1 C)  TempSrc: Oral  SpO2: 97%   Eyes: PERRL, lids and conjunctivae normal ENMT: Mucous membranes are moist. Posterior pharynx clear of any exudate or lesions.Normal dentition.  Neck: normal, supple, no masses, no thyromegaly Respiratory: clear to auscultation bilaterally, no wheezing, no crackles. Normal respiratory effort. No accessory muscle use.  Cardiovascular: Regular rate and rhythm, no murmurs / rubs / gallops. No extremity edema. 2+ pedal pulses. Abdomen: no tenderness, no masses palpated. No hepatosplenomegaly. Bowel sounds positive.  Musculoskeletal: no clubbing / cyanosis. No joint deformity upper and lower extremities. Good ROM, no contractures. Normal muscle tone. Right foot swollen  Skin: erythema of right foot, plantar ulcer plantar aspect of 2nd toe,no appreciable odor, slough noted.  Neurologic: CN 2-12 grossly intact. Sensation intact, DTR normal. Strength 5/5 in all 4.  Psychiatric: Normal judgment and insight. Alert and oriented x 3. Normal mood.    Labs on Admission: I have personally reviewed following labs and imaging  studies  CBC: Recent Labs  Lab 06/20/23 1330  WBC 15.6*  NEUTROABS 13.0*  HGB 14.6  HCT 44.1  MCV 84.5  PLT 192   Basic Metabolic Panel: Recent Labs  Lab 06/20/23 1330  NA 135  K 4.0  CL 96*  CO2 27  GLUCOSE 203*  BUN 24*  CREATININE 0.93  CALCIUM 9.4   GFR: CrCl cannot be calculated (Unknown ideal weight.). Liver Function Tests: Recent Labs  Lab 06/20/23 1330  AST 12*  ALT 10  ALKPHOS 75  BILITOT 1.7*  PROT 8.2*  ALBUMIN 3.5   No results for input(s): "LIPASE", "AMYLASE" in the last 168 hours. No results for input(s): "AMMONIA" in the last 168 hours. Coagulation Profile: No results for input(s): "INR", "PROTIME" in the last 168 hours. Cardiac Enzymes: No results for input(s): "CKTOTAL", "CKMB", "CKMBINDEX", "TROPONINI" in the last 168 hours. BNP (last 3 results)  No results for input(s): "PROBNP" in the last 8760 hours. HbA1C: No results for input(s): "HGBA1C" in the last 72 hours. CBG: No results for input(s): "GLUCAP" in the last 168 hours. Lipid Profile: No results for input(s): "CHOL", "HDL", "LDLCALC", "TRIG", "CHOLHDL", "LDLDIRECT" in the last 72 hours. Thyroid Function Tests: No results for input(s): "TSH", "T4TOTAL", "FREET4", "T3FREE", "THYROIDAB" in the last 72 hours. Anemia Panel: No results for input(s): "VITAMINB12", "FOLATE", "FERRITIN", "TIBC", "IRON", "RETICCTPCT" in the last 72 hours. Urine analysis:    Component Value Date/Time   COLORURINE YELLOW 12/01/2017 0129   APPEARANCEUR CLEAR 12/01/2017 0129   LABSPEC >1.046 (H) 12/01/2017 0129   PHURINE 6.0 12/01/2017 0129   GLUCOSEU >=500 (A) 12/01/2017 0129   HGBUR NEGATIVE 12/01/2017 0129   BILIRUBINUR NEGATIVE 12/01/2017 0129   KETONESUR 5 (A) 12/01/2017 0129   PROTEINUR 30 (A) 12/01/2017 0129   NITRITE NEGATIVE 12/01/2017 0129   LEUKOCYTESUR NEGATIVE 12/01/2017 0129    Radiological Exams on Admission: DG Foot Complete Right  Result Date: 06/20/2023 CLINICAL DATA:  Foot ulcer.  EXAM: RIGHT FOOT COMPLETE - 3+ VIEW COMPARISON:  07/14/2022 FINDINGS: Hallux valgus deformity. Negative for fracture or dislocation. Calcifications or ossifications along the plantar soft tissues. Calcaneal enthesopathic changes at the Achilles tendon insertion site. No evidence for bone destruction or osteomyelitis. IMPRESSION: 1. No acute bone abnormality to the right foot. 2. Hallux valgus deformity. Electronically Signed   By: Richarda Overlie M.D.   On: 06/20/2023 16:54    EKG: Independently reviewed. N/a  Assessment/Plan  Complicated soft tissue infection in diabetic patient with know ulcer  -admit to med tele  - consult [facility] Dr Lilian Kapur - s/p out patient prophalatic course of doxycyline s/p recent un roofing of ulcer -now with progressive pain and swelling  -foot xray no definite osteomyelitis  - continue on ctx, flagyl /vanc -de-escalate as able  - MRI to rule out osteo  - f/u with esr/crp   DMII, Uncontrolled -resume home insulin -iss/fs  - A1c  8.3  Hyperlipidemia -resume atorvastatin  DVT prophylaxis: heparin Code Status: full/ as discussed per patient wishes in event of cardiac arrest  Family Communication: n/a Disposition Plan: patient  expected to be admitted greater than 2 midnights  Consults called: Podiatry  Admission status: med tele   Lurline Del MD [facility]   If 7PM-7AM, please contact night-coverage [email or web address] Password [facility]  06/20/2023, 5:35 PM

## 2023-06-20 NOTE — Progress Notes (Signed)
Johann Capers NP triad  hospitalist notified that patient is having 10/10 stabbing pain gabapentin started then patient requested Trazodone for sleep and a 1 time dose was ordered and administered.

## 2023-06-20 NOTE — ED Notes (Signed)
2 Missed attempts for an IV.

## 2023-06-20 NOTE — ED Notes (Signed)
Jeffrey Murphy attempted an IV.

## 2023-06-20 NOTE — ED Notes (Signed)
Pt is unable to provide urine sample att. Pt stated he's dehydrated and there was no way he could pee

## 2023-06-20 NOTE — ED Provider Notes (Signed)
[place] EMERGENCY DEPARTMENT AT [facility] Provider Note   CSN: [id number] Arrival date & time: 06/20/23  1308     History {Add pertinent medical, surgical, social history, OB history to HPI:1} Chief Complaint  Patient presents with   Foot Wound   No Insulin 4 days    Jeffrey Murphy is a 67 y.o. male.  He has a history of diabetes and peripheral vascular disease, diabetic foot infections.  He had an blister debrided by podiatry last week and he has been on doxycycline.  He feels that the foot is more swollen and painful.  Did have a difficulty ambulating.  He denies any fever.  He last saw podiatry about 6 days ago.  The history is provided by the patient.  Foot Pain This is a new problem. The current episode started more than 1 week ago. The problem occurs constantly. The problem has been gradually worsening. Pertinent negatives include no chest pain, no abdominal pain, no headaches and no shortness of breath. The symptoms are aggravated by walking. Nothing relieves the symptoms. He has tried rest for the symptoms. The treatment provided no relief.       Home Medications Prior to Admission medications   Medication Sig Start Date End Date Taking? Authorizing Provider  atorvastatin (LIPITOR) 40 MG tablet Take 40 mg by mouth at bedtime. 02/02/22   [provider]  blood glucose meter kit and supplies KIT Dispense based on patient and insurance preference. Use up to four times daily as directed. 07/05/22   Lanae Boast, MD  doxycycline (VIBRA-TABS) 100 MG tablet Take 1 tablet (100 mg total) by mouth 2 (two) times daily. 06/14/23 07/14/23  Candelaria Stagers, DPM  furosemide (LASIX) 20 MG tablet Take 1 tablet (20 mg total) by mouth daily. If needed for leg swelling 07/24/19   Fulp, Cammie, MD  gabapentin (NEURONTIN) 300 MG capsule Take by mouth. 03/07/22   [provider]  gentamicin cream (GARAMYCIN) 0.1 % Apply 1 application. topically 2 (two) times daily. 03/16/22    [name], DPM  glucose blood (CONTOUR NEXT TEST) test strip Use as instructed 04/15/19   Fulp, Cammie, MD  glucose blood (TRUE METRIX BLOOD GLUCOSE TEST) test strip Use as directed 06/29/22   Dorcas Carrow, MD  insulin NPH-regular Human (70-30) 100 UNIT/ML injection Inject 30 Units into the skin 2 (two) times daily with a meal. 06/18/23   Banister, Janace Aris, MD  Insulin Pen Needle (TRUEPLUS PEN NEEDLES) 32G X 4 MM MISC Use to inject insulin daily. 05/29/19   Fulp, Cammie, MD  Insulin Syringes, Disposable, U-100 0.5 ML MISC Use as directed for insulin 12/13/18   Fulp, Cammie, MD  Lancet Devices (MICROLET NEXT LANCING DEVICE) MISC 1 each by Does not apply route 3 (three) times daily. 04/15/19   Fulp, Cammie, MD  lisinopril (ZESTRIL) 20 MG tablet Take 1 tablet (20 mg total) by mouth daily. To lower blood pressure 07/24/19   Fulp, Cammie, MD  Microlet Lancets MISC 1 each by Subdermal route 3 (three) times daily. 04/15/19   Cain Saupe, MD      Allergies    Patient has no known allergies.    Review of Systems   Review of Systems  Constitutional:  Negative for fever.  Respiratory:  Negative for shortness of breath.   Cardiovascular:  Negative for chest pain.  Gastrointestinal:  Negative for abdominal pain.  Skin:  Positive for wound.  Neurological:  Negative for headaches.  Physical Exam Updated Vital Signs BP 121/87 (BP Location: Right Arm)   Pulse (!) 109   Temp 98.7 F (37.1 C) (Oral)   Resp 14   SpO2 97%  Physical Exam Vitals and nursing note reviewed.  Constitutional:      General: He is not in acute distress.    Appearance: Normal appearance. He is well-developed.  HENT:     Head: Normocephalic and atraumatic.  Eyes:     Conjunctiva/sclera: Conjunctivae normal.  Cardiovascular:     Rate and Rhythm: Regular rhythm. Tachycardia present.     Heart sounds: No murmur heard. Pulmonary:     Effort: Pulmonary effort is normal. No respiratory distress.     Breath sounds: Normal  breath sounds.  Abdominal:     Palpations: Abdomen is soft.     Tenderness: There is no abdominal tenderness. There is no guarding or rebound.  Musculoskeletal:        General: Swelling and tenderness present.     Cervical back: Neck supple.     Right lower leg: Edema present.     Comments: He has moderate swelling of his right foot and ankle.  Has approximately 2 cm circular ulceration under his metatarsal head of the second metatarsal.  There is overlying swelling of his digits and erythema and warmth.  There is no purulent drainage.  Skin:    General: Skin is warm and dry.     Capillary Refill: Capillary refill takes less than 2 seconds.  Neurological:     General: No focal deficit present.     Mental Status: He is alert.     ED Results / Procedures / Treatments   Labs (all labs ordered are listed, but only abnormal results are displayed) Labs Reviewed  COMPREHENSIVE METABOLIC PANEL - Abnormal; Notable for the following components:      Result Value   Chloride 96 (*)    Glucose, Bld 203 (*)    BUN 24 (*)    Total Protein 8.2 (*)    AST 12 (*)    Total Bilirubin 1.7 (*)    All other components within normal limits  CBC WITH DIFFERENTIAL/PLATELET - Abnormal; Notable for the following components:   WBC 15.6 (*)    Neutro Abs 13.0 (*)    All other components within normal limits  CULTURE, BLOOD (ROUTINE X 2)  CULTURE, BLOOD (ROUTINE X 2)  URINALYSIS, ROUTINE W REFLEX MICROSCOPIC  SEDIMENTATION RATE  C-REACTIVE PROTEIN  I-STAT CG4 LACTIC ACID, ED  I-STAT CG4 LACTIC ACID, ED    EKG None  Radiology No results found.  Procedures Procedures  {Document cardiac monitor, telemetry assessment procedure when appropriate:1}  Medications Ordered in ED Medications  oxyCODONE-acetaminophen (PERCOCET/ROXICET) 5-325 MG per tablet 1 tablet (has no administration in time range)    ED Course/ Medical Decision Making/ A&P   {   Click here for ABCD2, HEART and other  calculatorsREFRESH Note before signing :1}                              Medical Decision Making Amount and/or Complexity of Data Reviewed Labs: ordered. Radiology: ordered.  Risk Prescription drug management.   This patient complains of ***; this involves an extensive number of treatment Options and is a complaint that carries with it a high risk of complications and morbidity. The differential includes ***  I ordered, reviewed and interpreted labs, which included *** I ordered medication ***  and reviewed PMP when indicated. I ordered imaging studies which included *** and I independently    visualized and interpreted imaging which showed *** Additional history obtained from *** Previous records obtained and reviewed *** I consulted *** and discussed lab and imaging findings and discussed disposition.  Cardiac monitoring reviewed, *** Social determinants considered, *** Critical Interventions: ***  After the interventions stated above, I reevaluated the patient and found *** Admission and further testing considered, ***   {Document critical care time when appropriate:1} {Document review of labs and clinical decision tools ie heart score, Chads2Vasc2 etc:1}  {Document your independent review of radiology images, and any outside records:1} {Document your discussion with family members, caretakers, and with consultants:1} {Document social determinants of health affecting pt's care:1} {Document your decision making why or why not admission, treatments were needed:1} Final Clinical Impression(s) / ED Diagnoses Final diagnoses:  None    Rx / DC Orders ED Discharge Orders     None

## 2023-06-20 NOTE — ED Triage Notes (Signed)
Pt came in via POV d/t Rt foot wound. States it is swelling more today & the wound has been there about 1.5 months & managed by his podiatrist. States he can barely walk on his foot, A/Ox4, rates his pain 8/10. Pt also states that he has been without his insulin for 4 days as well.

## 2023-06-20 NOTE — ED Notes (Signed)
ED TO INPATIENT HANDOFF REPORT  ED Nurse Name and Phone #: [phone]   S Name/Age/Gender Jeffrey Murphy 67 y.o. male Room/Bed: 034C/034C  Code Status   Code Status: Prior  Home/SNF/Other Home Patient oriented to: self, place, time, and situation Is this baseline? Yes   Triage Complete: Triage complete  Chief Complaint Diabetic foot infection (HCC) [A54.098, L08.9]  Triage Note Pt came in via POV d/t Rt foot wound. States it is swelling more today & the wound has been there about 1.5 months & managed by his podiatrist. States he can barely walk on his foot, A/Ox4, rates his pain 8/10. Pt also states that he has been without his insulin for 4 days as well.     Allergies No Known Allergies  Level of Care/Admitting Diagnosis ED Disposition     ED Disposition  Admit   Condition  --   Comment  Hospital Area: [facility] [[id number]]  Level of Care: Telemetry Medical [104]  May admit patient to [facility] or [facility] if equivalent level of care is available:: No  Covid Evaluation: Asymptomatic - no recent exposure (last 10 days) testing not required  Diagnosis: Diabetic foot infection [facility]) [[id number]]  Admitting Physician: Lurline Del [[id number]]  Attending Physician: Lurline Del [[id number]]  Certification:: I certify this patient will need inpatient services for at least 2 midnights  Expected Medical Readiness: 06/23/2023          B Medical/Surgery History Past Medical History:  Diagnosis Date   Abscess 12/2016   LEFT INGUINAL ABSCESS   Arthritis    Diabetes mellitus without complication (HCC)    Hypertension    Wears glasses    reading   Past Surgical History:  Procedure Laterality Date   COLONOSCOPY     CYST REMOVAL NECK     INCISION AND DRAINAGE ABSCESS N/A 07/02/2022   Procedure: INCISION AND DRAINAGE ABSCESS;  Surgeon: Suzanna Obey, MD;  Location: [facility] OR;  Service: ENT;  Laterality: N/A;   INGUINAL HERNIA REPAIR     right    IRRIGATION AND DEBRIDEMENT ABSCESS Left 12/12/2016   Procedure: IRRIGATION AND DEBRIDEMENT ABSCESS;  Surgeon: Almond Lint, MD;  Location: [facility] OR;  Service: General;  Laterality: Left;   JOINT REPLACEMENT     right total knee   PILONIDAL CYST EXCISION     back   QUADRICEPS TENDON REPAIR Right 12/24/2014   Procedure: RIGHT QUADRICEP TENDON REPAIR ;  Surgeon: Cheral Almas, MD;  Location: [facility];  Service: Orthopedics;  Laterality: Right;   REPLACEMENT TOTAL KNEE       A IV Location/Drains/Wounds Patient Lines/Drains/Airways Status     Active Line/Drains/Airways     Name Placement date Placement time Site Days   Open Drain 2 Posterior Neck  07/02/22  0947  Neck  353            Intake/Output Last 24 hours No intake or output data in the 24 hours ending 06/20/23 1819  Labs/Imaging Results for orders placed or performed during the hospital encounter of 06/20/23 (from the past 48 hour(s))  Comprehensive metabolic panel     Status: Abnormal   Collection Time: 06/20/23  1:30 PM  Result Value Ref Range   Sodium 135 135 - 145 mmol/L   Potassium 4.0 3.5 - 5.1 mmol/L   Chloride 96 (L) 98 - 111 mmol/L   CO2 27 22 - 32 mmol/L   Glucose, Bld 203 (H) 70 - 99 mg/dL  Comment: Glucose reference range applies only to samples taken after fasting for at least 8 hours.   BUN 24 (H) 8 - 23 mg/dL   Creatinine, Ser 1.61 0.61 - 1.24 mg/dL   Calcium 9.4 8.9 - 09.6 mg/dL   Total Protein 8.2 (H) 6.5 - 8.1 g/dL   Albumin 3.5 3.5 - 5.0 g/dL   AST 12 (L) 15 - 41 U/L   ALT 10 0 - 44 U/L   Alkaline Phosphatase 75 38 - 126 U/L   Total Bilirubin 1.7 (H) 0.3 - 1.2 mg/dL   GFR, Estimated >04 >54 mL/min    Comment: (NOTE) Calculated using the CKD-EPI Creatinine Equation (2021)    Anion gap 12 5 - 15    Comment: Performed at [facility], [address] [place], [place] [address]  CBC with Differential     Status: Abnormal   Collection Time: 06/20/23  1:30 PM  Result  Value Ref Range   WBC 15.6 (H) 4.0 - 10.5 K/uL   RBC 5.22 4.22 - 5.81 MIL/uL   Hemoglobin 14.6 13.0 - 17.0 g/dL   HCT 91.4 78.2 - 95.6 %   MCV 84.5 80.0 - 100.0 fL   MCH 28.0 26.0 - 34.0 pg   MCHC 33.1 30.0 - 36.0 g/dL   RDW 21.3 08.6 - 57.8 %   Platelets 192 150 - 400 K/uL   nRBC 0.0 0.0 - 0.2 %   Neutrophils Relative % 83 %   Neutro Abs 13.0 (H) 1.7 - 7.7 K/uL   Lymphocytes Relative 9 %   Lymphs Abs 1.4 0.7 - 4.0 K/uL   Monocytes Relative 6 %   Monocytes Absolute 0.9 0.1 - 1.0 K/uL   Eosinophils Relative 2 %   Eosinophils Absolute 0.3 0.0 - 0.5 K/uL   Basophils Relative 0 %   Basophils Absolute 0.0 0.0 - 0.1 K/uL   Immature Granulocytes 0 %   Abs Immature Granulocytes 0.06 0.00 - 0.07 K/uL    Comment: Performed at [facility], [address] [place], [place] [address]  I-Stat Lactic Acid, ED     Status: None   Collection Time: 06/20/23  1:35 PM  Result Value Ref Range   Lactic Acid, Venous 1.0 0.5 - 1.9 mmol/L  Sedimentation rate     Status: Abnormal   Collection Time: 06/20/23  4:46 PM  Result Value Ref Range   Sed Rate 53 (H) 0 - 16 mm/hr    Comment: Performed at [facility], [address], [place], [place] [address]  C-reactive protein     Status: Abnormal   Collection Time: 06/20/23  4:46 PM  Result Value Ref Range   CRP 23.8 (H) <1.0 mg/dL    Comment: Performed at [facility], [address] [place], [place] [address]  I-Stat Lactic Acid, ED     Status: Abnormal   Collection Time: 06/20/23  5:03 PM  Result Value Ref Range   Lactic Acid, Venous 2.2 (HH) 0.5 - 1.9 mmol/L   Comment NOTIFIED PHYSICIAN    DG Foot Complete Right  Result Date: 06/20/2023 CLINICAL DATA:  Foot ulcer. EXAM: RIGHT FOOT COMPLETE - 3+ VIEW COMPARISON:  07/14/2022 FINDINGS: Hallux valgus deformity. Negative for fracture or dislocation. Calcifications or ossifications along the plantar soft tissues. Calcaneal enthesopathic changes at the Achilles tendon insertion site. No  evidence for bone destruction or osteomyelitis. IMPRESSION: 1. No acute bone abnormality to the right foot. 2. Hallux valgus deformity. Electronically  Signed   By: Richarda Overlie M.D.   On: 06/20/2023 16:54    Pending Labs Unresulted Labs (From admission, onward)     Start     Ordered   06/20/23 1524  Culture, blood (routine x 2)  BLOOD CULTURE X 2,   R (with STAT occurrences)      06/20/23 1524   06/20/23 1324  Urinalysis, Routine w reflex microscopic -Urine, Clean Catch  Once,   URGENT       Question:  Specimen Source  Answer:  Urine, Clean Catch   06/20/23 1323            Vitals/Pain Today's Vitals   06/20/23 1313 06/20/23 1321 06/20/23 1745 06/20/23 1756  BP: 121/87  112/64   Pulse: (!) 109  (!) 105   Resp: 14     Temp: 98.7 F (37.1 C)   98.6 F (37 C)  TempSrc: Oral   Oral  SpO2: 97%  99%   PainSc:  8       Isolation Precautions No active isolations  Medications Medications  cefTRIAXone (ROCEPHIN) 2 g in sodium chloride 0.9 % 100 mL IVPB (has no administration in time range)    And  metroNIDAZOLE (FLAGYL) IVPB 500 mg (has no administration in time range)  oxyCODONE-acetaminophen (PERCOCET/ROXICET) 5-325 MG per tablet 1 tablet (1 tablet Oral Given 06/20/23 1642)    Mobility walks     Focused Assessments Wound to right foot   R Recommendations: See Admitting Provider Note  Report given to:   Additional Notes: [id number]

## 2023-06-21 ENCOUNTER — Other Ambulatory Visit: Payer: Self-pay

## 2023-06-21 ENCOUNTER — Encounter (HOSPITAL_COMMUNITY)
Admission: EM | Disposition: A | Discharge status: Skilled Nursing Facility | Payer: Self-pay | Source: Ambulatory Visit | Attending: Family Medicine

## 2023-06-21 ENCOUNTER — Inpatient Hospital Stay (HOSPITAL_COMMUNITY): Payer: Medicare PPO | Admitting: Anesthesiology

## 2023-06-21 ENCOUNTER — Encounter (HOSPITAL_COMMUNITY): Payer: Self-pay | Admitting: Internal Medicine

## 2023-06-21 DIAGNOSIS — L089 Local infection of the skin and subcutaneous tissue, unspecified: Secondary | ICD-10-CM | POA: Diagnosis not present

## 2023-06-21 DIAGNOSIS — E1169 Type 2 diabetes mellitus with other specified complication: Secondary | ICD-10-CM

## 2023-06-21 DIAGNOSIS — F419 Anxiety disorder, unspecified: Secondary | ICD-10-CM

## 2023-06-21 DIAGNOSIS — M869 Osteomyelitis, unspecified: Secondary | ICD-10-CM

## 2023-06-21 DIAGNOSIS — M86171 Other acute osteomyelitis, right ankle and foot: Secondary | ICD-10-CM | POA: Diagnosis not present

## 2023-06-21 DIAGNOSIS — I10 Essential (primary) hypertension: Secondary | ICD-10-CM | POA: Diagnosis not present

## 2023-06-21 DIAGNOSIS — E11628 Type 2 diabetes mellitus with other skin complications: Secondary | ICD-10-CM | POA: Diagnosis not present

## 2023-06-21 HISTORY — PX: IRRIGATION AND DEBRIDEMENT FOOT: SHX6602

## 2023-06-21 HISTORY — PX: APPLICATION OF WOUND VAC: SHX5189

## 2023-06-21 LAB — GLUCOSE, CAPILLARY
Glucose-Capillary: 120 mg/dL — ABNORMAL HIGH (ref 70–99)
Glucose-Capillary: 125 mg/dL — ABNORMAL HIGH (ref 70–99)
Glucose-Capillary: 131 mg/dL — ABNORMAL HIGH (ref 70–99)
Glucose-Capillary: 138 mg/dL — ABNORMAL HIGH (ref 70–99)
Glucose-Capillary: 177 mg/dL — ABNORMAL HIGH (ref 70–99)
Glucose-Capillary: 234 mg/dL — ABNORMAL HIGH (ref 70–99)
Glucose-Capillary: 311 mg/dL — ABNORMAL HIGH (ref 70–99)

## 2023-06-21 LAB — MRSA NEXT GEN BY PCR, NASAL: MRSA by PCR Next Gen: NOT DETECTED

## 2023-06-21 SURGERY — IRRIGATION AND DEBRIDEMENT FOOT
Anesthesia: Monitor Anesthesia Care | Site: Foot | Laterality: Right

## 2023-06-21 MED ORDER — PROPOFOL 10 MG/ML IV BOLUS
INTRAVENOUS | Status: DC | PRN
  Administered 2023-06-21: 20 mg via INTRAVENOUS

## 2023-06-21 MED ORDER — VANCOMYCIN HCL 500 MG IV SOLR
INTRAVENOUS | Status: DC | PRN
Start: 2023-06-21 — End: 2023-06-21
  Administered 2023-06-21: 500 mg

## 2023-06-21 MED ORDER — TOBRAMYCIN SULFATE 80 MG/2ML IJ SOLN
INTRAMUSCULAR | Status: AC
  Filled 2023-06-21: qty 2

## 2023-06-21 MED ORDER — LIDOCAINE 2% (20 MG/ML) 5 ML SYRINGE
INTRAMUSCULAR | Status: DC | PRN
  Administered 2023-06-21: 40 mg via INTRAVENOUS

## 2023-06-21 MED ORDER — 0.9 % SODIUM CHLORIDE (POUR BTL) OPTIME
TOPICAL | Status: DC | PRN
Start: 2023-06-21 — End: 2023-06-21
  Administered 2023-06-21: 1000 mL

## 2023-06-21 MED ORDER — FENTANYL CITRATE (PF) 100 MCG/2ML IJ SOLN
25.0000 ug | INTRAMUSCULAR | Status: DC | PRN
  Administered 2023-06-21 (×2): 50 ug via INTRAVENOUS

## 2023-06-21 MED ORDER — OXYCODONE HCL 5 MG/5ML PO SOLN: 5.0000 mg | Freq: Once | ORAL | Status: DC | PRN

## 2023-06-21 MED ORDER — PROPOFOL 500 MG/50ML IV EMUL
INTRAVENOUS | Status: DC | PRN
  Administered 2023-06-21: 180 ug/kg/min via INTRAVENOUS

## 2023-06-21 MED ORDER — PHENYLEPHRINE 80 MCG/ML (10ML) SYRINGE FOR IV PUSH (FOR BLOOD PRESSURE SUPPORT)
PREFILLED_SYRINGE | INTRAVENOUS | Status: DC | PRN
  Administered 2023-06-21: 160 ug via INTRAVENOUS
  Administered 2023-06-21 (×4): 80 ug via INTRAVENOUS

## 2023-06-21 MED ORDER — CHLORHEXIDINE GLUCONATE 0.12 % MT SOLN
15.0000 mL | Freq: Once | OROMUCOSAL | Status: AC
  Administered 2023-06-21: 15 mL via OROMUCOSAL
  Filled 2023-06-21: qty 15

## 2023-06-21 MED ORDER — CHLORHEXIDINE GLUCONATE CLOTH 2 % EX PADS: 6.0000 | MEDICATED_PAD | Freq: Every day | CUTANEOUS | Status: DC

## 2023-06-21 MED ORDER — POTASSIUM CHLORIDE CRYS ER 20 MEQ PO TBCR
40.0000 meq | EXTENDED_RELEASE_TABLET | ORAL | Status: AC
  Administered 2023-06-21: 40 meq via ORAL
  Filled 2023-06-21: qty 2

## 2023-06-21 MED ORDER — SODIUM CHLORIDE 0.9 % IR SOLN
Status: DC | PRN
  Administered 2023-06-21: 3000 mL

## 2023-06-21 MED ORDER — FENTANYL CITRATE (PF) 250 MCG/5ML IJ SOLN
INTRAMUSCULAR | Status: DC | PRN
  Administered 2023-06-21: 50 ug via INTRAVENOUS

## 2023-06-21 MED ORDER — FENTANYL CITRATE (PF) 100 MCG/2ML IJ SOLN
INTRAMUSCULAR | Status: AC
  Filled 2023-06-21: qty 2

## 2023-06-21 MED ORDER — TOBRAMYCIN SULFATE 80 MG/2ML IJ SOLN
INTRAMUSCULAR | Status: DC | PRN
Start: 2023-06-21 — End: 2023-06-21
  Administered 2023-06-21: 80 mg

## 2023-06-21 MED ORDER — VANCOMYCIN HCL 1000 MG IV SOLR
INTRAVENOUS | Status: AC
  Filled 2023-06-21: qty 20

## 2023-06-21 MED ORDER — ORAL CARE MOUTH RINSE: 15.0000 mL | Freq: Once | OROMUCOSAL | Status: AC

## 2023-06-21 MED ORDER — VANCOMYCIN HCL 500 MG IV SOLR
INTRAVENOUS | Status: AC
  Filled 2023-06-21: qty 10

## 2023-06-21 MED ORDER — MIDAZOLAM HCL 2 MG/2ML IJ SOLN
INTRAMUSCULAR | Status: DC | PRN
  Administered 2023-06-21: 2 mg via INTRAVENOUS

## 2023-06-21 MED ORDER — PROMETHAZINE HCL 25 MG/ML IJ SOLN: 6.2500 mg | INTRAMUSCULAR | Status: DC | PRN

## 2023-06-21 MED ORDER — PROPOFOL 10 MG/ML IV BOLUS
INTRAVENOUS | Status: AC
  Filled 2023-06-21: qty 20

## 2023-06-21 MED ORDER — BUPIVACAINE HCL (PF) 0.5 % IJ SOLN
INTRAMUSCULAR | Status: AC
  Filled 2023-06-21: qty 30

## 2023-06-21 MED ORDER — BUPIVACAINE HCL (PF) 0.5 % IJ SOLN
INTRAMUSCULAR | Status: DC | PRN
  Administered 2023-06-21: 20 mL

## 2023-06-21 MED ORDER — LACTATED RINGERS IV SOLN: INTRAVENOUS | Status: DC

## 2023-06-21 MED ORDER — FENTANYL CITRATE (PF) 250 MCG/5ML IJ SOLN
INTRAMUSCULAR | Status: AC
  Filled 2023-06-21: qty 5

## 2023-06-21 MED ORDER — OXYCODONE HCL 5 MG PO TABS: 5.0000 mg | ORAL_TABLET | Freq: Once | ORAL | Status: DC | PRN

## 2023-06-21 MED ORDER — EPHEDRINE SULFATE (PRESSORS) 50 MG/ML IJ SOLN
INTRAMUSCULAR | Status: DC | PRN
  Administered 2023-06-21: 5 mg via INTRAVENOUS

## 2023-06-21 MED ORDER — TOBRAMYCIN SULFATE 80 MG/2ML IJ SOLN
INTRAMUSCULAR | Status: DC | PRN
  Administered 2023-06-21: 80 mg

## 2023-06-21 MED ORDER — GENTAMICIN SULFATE 40 MG/ML IJ SOLN
INTRAMUSCULAR | Status: AC
  Filled 2023-06-21: qty 2

## 2023-06-21 MED ORDER — ONDANSETRON HCL 4 MG/2ML IJ SOLN
INTRAMUSCULAR | Status: DC | PRN
  Administered 2023-06-21: 4 mg via INTRAVENOUS

## 2023-06-21 MED ORDER — ACETAMINOPHEN 500 MG PO TABS
1000.0000 mg | ORAL_TABLET | Freq: Once | ORAL | Status: AC
  Administered 2023-06-21: 1000 mg via ORAL
  Filled 2023-06-21: qty 2

## 2023-06-21 MED ORDER — MIDAZOLAM HCL 2 MG/2ML IJ SOLN
INTRAMUSCULAR | Status: AC
  Filled 2023-06-21: qty 2

## 2023-06-21 MED ORDER — SODIUM CHLORIDE 0.9 % IV SOLN
2.0000 g | Freq: Three times a day (TID) | INTRAVENOUS | Status: DC
  Administered 2023-06-21 – 2023-06-26 (×15): 2 g via INTRAVENOUS
  Filled 2023-06-21 (×15): qty 12.5

## 2023-06-21 SURGICAL SUPPLY — 52 items
APL PRP STRL LF DISP 70% ISPRP (MISCELLANEOUS) ×1
BAG COUNTER SPONGE SURGICOUNT (BAG) ×2 IMPLANT
BAG SPNG CNTER NS LX DISP (BAG) ×1
BNDG ADH 5X4 AIR PERM ELC (GAUZE/BANDAGES/DRESSINGS) ×1
BNDG CMPR 9X4 STRL LF SNTH (GAUZE/BANDAGES/DRESSINGS)
BNDG COHESIVE 4X5 WHT NS (GAUZE/BANDAGES/DRESSINGS) IMPLANT
BNDG ELASTIC 4X5.8 VLCR STR LF (GAUZE/BANDAGES/DRESSINGS) IMPLANT
BNDG ESMARK 4X9 LF (GAUZE/BANDAGES/DRESSINGS) IMPLANT
BNDG GAUZE DERMACEA FLUFF 4 (GAUZE/BANDAGES/DRESSINGS) IMPLANT
BNDG GZE DERMACEA 4 6PLY (GAUZE/BANDAGES/DRESSINGS)
CANISTER WOUNDNEG PRESSURE 500 (CANNISTER) IMPLANT
CHLORAPREP W/TINT 26 (MISCELLANEOUS) ×2 IMPLANT
CNTNR URN SCR LID CUP LEK RST (MISCELLANEOUS) IMPLANT
CONT SPEC 4OZ STRL OR WHT (MISCELLANEOUS) ×2
COVER SURGICAL LIGHT HANDLE (MISCELLANEOUS) ×2 IMPLANT
CUFF TOURN SGL QUICK 18X4 (TOURNIQUET CUFF) IMPLANT
CUFF TOURN SGL QUICK 34 (TOURNIQUET CUFF)
CUFF TRNQT CYL 34X4.125X (TOURNIQUET CUFF) IMPLANT
DRAPE DERMATAC (DRAPES) IMPLANT
DRAPE U-SHAPE 47X51 STRL (DRAPES) ×2 IMPLANT
DRSG VAC GRANUFOAM SM (GAUZE/BANDAGES/DRESSINGS) IMPLANT
ELECT CAUTERY BLADE 6.4 (BLADE) ×2 IMPLANT
ELECT REM PT RETURN 9FT ADLT (ELECTROSURGICAL) ×1
ELECTRODE REM PT RTRN 9FT ADLT (ELECTROSURGICAL) ×2 IMPLANT
GAUZE SPONGE 4X4 12PLY STRL (GAUZE/BANDAGES/DRESSINGS) IMPLANT
GLOVE BIO SURGEON STRL SZ7 (GLOVE) ×2 IMPLANT
GLOVE BIOGEL PI IND STRL 7.5 (GLOVE) ×2 IMPLANT
GLOVE INDICATOR 7.0 STRL GRN (GLOVE) IMPLANT
GOWN STRL REUS W/ TWL LRG LVL3 (GOWN DISPOSABLE) ×4 IMPLANT
GOWN STRL REUS W/TWL LRG LVL3 (GOWN DISPOSABLE) ×2
KIT BASIN OR (CUSTOM PROCEDURE TRAY) ×2 IMPLANT
KIT STIMULAN RAPID CURE 5CC (Orthopedic Implant) IMPLANT
KIT TURNOVER KIT B (KITS) ×2 IMPLANT
MANIFOLD NEPTUNE II (INSTRUMENTS) ×2 IMPLANT
NDL HYPO 25GX1X1/2 BEV (NEEDLE) IMPLANT
NEEDLE HYPO 25GX1X1/2 BEV (NEEDLE) IMPLANT
NS IRRIG 1000ML POUR BTL (IV SOLUTION) ×2 IMPLANT
PACK ORTHO EXTREMITY (CUSTOM PROCEDURE TRAY) ×2 IMPLANT
PAD ARMBOARD 7.5X6 YLW CONV (MISCELLANEOUS) ×4 IMPLANT
PROBE DEBRIDE SONICVAC MISONIX (TIP) IMPLANT
PULSAVAC PLUS IRRIG FAN TIP (DISPOSABLE) ×1
SET CYSTO W/LG BORE CLAMP LF (SET/KITS/TRAYS/PACK) ×2 IMPLANT
SOL PREP POV-IOD 4OZ 10% (MISCELLANEOUS) ×4 IMPLANT
SUT ETHILON 3 0 PS 1 (SUTURE) ×2 IMPLANT
SUT MNCRL AB 3-0 PS2 18 (SUTURE) ×2 IMPLANT
SWAB CULTURE ESWAB REG 1ML (MISCELLANEOUS) IMPLANT
SYR CONTROL 10ML LL (SYRINGE) IMPLANT
TIP FAN IRRIG PULSAVAC PLUS (DISPOSABLE) IMPLANT
TOWEL GREEN STERILE (TOWEL DISPOSABLE) ×2 IMPLANT
TOWEL GREEN STERILE FF (TOWEL DISPOSABLE) ×2 IMPLANT
TUBE CONNECTING 12X1/4 (SUCTIONS) ×2 IMPLANT
YANKAUER SUCT BULB TIP NO VENT (SUCTIONS) ×2 IMPLANT

## 2023-06-21 NOTE — TOC Initial Note (Signed)
Transition of Care (TOC) - Initial/Assessment Note   Spoke to patient at bedside.   Patient from home alone. Received consult for patient having trouble getting his insulin from pharmacy and no transportation.   Patient states he usually uses [facility] and his insulin was on back order. [facility] but he found it at [employer] and did get his insulin from there.   His friend Shortly provides transportation for him. Will also place transportation resources on AVS.   Patient lives alone. His friend [name] [phone] assists him.   Also received consult for housing, utilities and food.   Patient reports he sold land last year and had almost one million dollars and he spent it all on drugs etc. His wife past away October 21, 2022. He did however, pay cash for the house he lives in now ( address in [facility]) . His car was sold and his cell phone was in his car. Once he gets money he will purchase another cell phone. For now he can be contacted through friend [name] [phone].   He is behind on utilities . NCM placed resources for transportation , utilities and food on AVS.   Discuss drug use. Patient reports he only does a little cocaine now "once in awhile" with his buddy. When asked how much and frequency patient reports only when he has money . Last used was 2 days ago him and his buddy did a 8 ball ( 3.5 grams).   NCM explained after surgery PT/OT will do an evaluation and make recommendations. However, drug use will be barriers to home health and rehab.   Patient states PT will need to come to him because he has no transportation. Placed transportation resources on AVS. He does not want to go anywhere for rehab and stay ( again drug use barrier).   Patient states if HH agrees to accept him him back he will not do any drugs.   Patient had [facility] last year . NCM spoke with Jeffrey Murphy with [facility] explained all of above. She will review chart, await post op  PT recommendations and discuss with her manager if they could accept him back. She has documentation that it was difficult to get in touch with patient to schedule visits and then several times when [name] went to his home for scheduled visit he was not at home.   Patient states his only DME at home is crutches and walker.     Patient's pharmacy listed is [facility]. NCM called same and was told if patient wants medications mailed to him , he can use OP [facility] .    At discharge MD can send prescriptions to [facility] . They will fill prescriptions , patient can pay co pays with card or cash or be billed. TOC Pharmacy will bring medications to bedside prior to discharge. At follow up appointments he can have provider send prescriptions to [facility] and they will mail medications to him. Patient voiced understanding and is in agreement     Patient for surgery today  Patient Details  Name: Jeffrey Murphy MRN: [id number] Date of Birth: Aug 02, 1956  Transition of Care [facility]) CM/SW Contact:    [name], RN Phone Number: 06/21/2023, 10:44 AM  Clinical Narrative:                   Expected Discharge Plan: Home w Home Health Services [facility]  reviewing if they can accept him back, see note)     Patient Goals and CMS Choice Patient states their goals for this hospitalization and ongoing recovery are:: to return to home CMS Medicare.gov Compare Post Acute Care list provided to:: Patient Choice offered to / list presented to : Patient Loyal ownership interest in [facility].provided to:: Patient    Expected Discharge Plan and Services   Discharge Planning Services: CM Consult Post Acute Care Choice: Home Health Living arrangements for the past 2 months: Single Family Home                             HH Agency:  [name]) Date HH Agency Contacted: 06/21/23 Time HH Agency Contacted: 1042 Representative  spoke with at [facility] Agency: Jeffrey Murphy  Prior Living Arrangements/Services Living arrangements for the past 2 months: Single Family Home Lives with:: Self Patient language and need for interpreter reviewed:: Yes Do you feel safe going back to the place where you live?: Yes      Need for Family Participation in Patient Care: Yes (Comment) Care giver support system in place?: Yes (comment) Current home services: DME (crutches) Criminal Activity/Legal Involvement Pertinent to Current Situation/Hospitalization: No - Comment as needed  Activities of Daily Living Home Assistive Devices/Equipment: None ADL Screening (condition at time of admission) Patient's cognitive ability adequate to safely complete daily activities?: Yes Is the patient deaf or have difficulty hearing?: No Does the patient have difficulty seeing, even when wearing glasses/contacts?: No Does the patient have difficulty concentrating, remembering, or making decisions?: No Patient able to express need for assistance with ADLs?: Yes Does the patient have difficulty dressing or bathing?: No Independently performs ADLs?: Yes (appropriate for developmental age) Does the patient have difficulty walking or climbing stairs?: Yes (due to right foot) Weakness of Legs: None Weakness of Arms/Hands: Both (rotator cuff issues)  Permission Sought/Granted   Permission granted to share information with : No              Emotional Assessment Appearance:: Appears stated age Attitude/Demeanor/Rapport: Engaged Affect (typically observed): Accepting Orientation: : Oriented to Self, Oriented to Place, Oriented to  Time, Oriented to Situation Alcohol / Substance Use: Illicit Drugs Psych Involvement: No (comment)  Admission diagnosis:  Diabetic foot infection (HCC) [W09.811, L08.9] Patient Active Problem List   Diagnosis Date Noted   Pyogenic inflammation of bone (HCC) 06/21/2023   Diabetic foot infection (HCC) 06/20/2023   Folliculitis     Abscess of neck 06/27/2022   Hyponatremia with increased serum osmolality 06/27/2022   Inguinal abscess 12/12/2016   Diabetes mellitus with complication (HCC) 12/12/2016   Sepsis (HCC) 12/12/2016   Drug abuse (HCC)    Hyperglycemia    GERD (gastroesophageal reflux disease) 04/19/2016   Hypertension 04/19/2016   Anxiety 04/19/2016   Rupture of right quadriceps tendon 12/24/2014   Quadriceps tendon rupture 12/24/2014   PCP:  [name], NP Pharmacy:   [facility] [place] [facility] [address], [address] [place] [address] Phone: [phone] Fax: [phone]  [facility] [address] [place]), [place] - [address] [place] [address] Phone: [phone] Fax: [phone]  [facility] [address] [place] [address] Phone: [phone] Fax: [phone]     Social Determinants of Health (SDOH) Social History: SDOH Screenings   Food Insecurity: Food Insecurity Present (06/20/2023)  Housing: Medium  Risk (06/20/2023)  Transportation Needs: Unmet Transportation Needs (06/20/2023)  Utilities: At Risk (06/20/2023)  Depression (PHQ2-9): Medium Risk (05/29/2019)  Tobacco Use: Low Risk  (06/20/2023)   SDOH Interventions:     Readmission Risk Interventions    06/29/2022   11:40 AM  Readmission Risk Prevention Plan  Transportation Screening Complete  PCP or Specialist Appt within 5-7 Days Complete  Home Care Screening Complete  Medication Review (RN CM) Complete

## 2023-06-21 NOTE — Progress Notes (Signed)
Patient's 1400 Heparin held due to surgery.  Jeffrey Junker MD notified.

## 2023-06-21 NOTE — Progress Notes (Signed)
[facility] PROGRESS NOTE  Jeffrey Murphy (DOB: [birth year]) WUJ:[id number] PCP: [name], NP  Brief Narrative: HPI: Jeffrey Murphy is a 67 y.o. male with medical history significant of  DMII, Hypertension , who has interim history of Right submetatarsal 2 friction blister with underlying ulceration with fat layer exposed s/p  drainage of blister on 8/7 s/p which patient was given doxycycline for skin and soft tissue infection prophylaxis.  Patient notes around 48 hours ago he noted increase redness swelling,drain and odor from area. He presents to ED due to persistent swelling with concern for infected ulcer. Patient notes no fever/n/v/d but states he had mild chills. He denies any sob/cough / chest pain or other complaints   ED Course: Vitals:afeb, bp121/87, hr 109 rr 16 sat 975  Labs Lactic 2.2  ESR 53, crp 23.8 Na 135, K 4, Cl 96, bicarb 27, cr 0.93 Wbc 15.6, hgb 14.6,  plt 192 RLE xray:IMPRESSION: 1. No acute bone abnormality to the right foot. 2. Hallux valgus deformity.  Subjective: Pain controlled, mentions using cocaine from time to time, no current or remote IVDU.   Objective: BP 116/63 (BP Location: Right Arm)   Pulse 93   Temp 98.2 F (36.8 C) (Oral)   Resp 18   Ht 5\' 9"  (1.753 m)   Wt 86.2 kg   SpO2 93%   BMI 28.06 kg/m   Gen: Chronically ill-appearing male in no distress Pulm: Clear, nonlabored  CV: RRR, no MRG GI: Soft, NT, ND, +BS  Neuro: Alert and oriented. No new focal deficits. Ext: Warm, cap refill intact. R foot erythematous, edema to lower calf, tender.   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 Culture would be appreciated.  - Monitor blood cultures.  - Pain control including gabapentin, oxycodone   IDT2DM: HbA1c 8.2%. - Continue home insulin   HLD:  -  Continue statin  Hx polysubstance abuse:  - UDS  [name], MD [facility] [email or web address] 06/21/2023, 5:16 PM

## 2023-06-21 NOTE — TOC CAGE-AID Note (Signed)
Transition of Care [facility]) - CAGE-AID Screening   Patient Details  Name: Jeffrey Murphy MRN: [id number] Date of Birth: [birth year]  [facility] CM/SW Contact:    [name], RN Phone Number: 06/21/2023, 11:17 AM   Clinical Narrative:    CAGE-AID Screening: Substance Abuse Screening unable to be completed due to: : Patient Refused             Substance Abuse Education Offered: Yes (refused)

## 2023-06-21 NOTE — Discharge Instructions (Addendum)
SUNDAYS BREAKFAST TWO LOCATIONS: 8:00am served in [facility] by Awaken PPL Corporation 8:30am SHUTTLE provided from [facility] In [place], served at Apache Corporation, [address] LUNCH TWO LOCATIONS [plus one additional third Sunday only] 10:30am - 12:30pm served at [employer], Liberty Global, [place] [address] (1.2 miles from [facility]) 12:30pm served in [place] by Land O'Lakes Team (THIRD Sunday only) 1:30pm served at [facility] by [facility] one location [plus one additional third Sunday only] 5:00pm Every Sunday, served under the bridge at [address] by Lindell Noe Under the 3M Company (.7 miles from [facility]) (THIRD Sunday ONLY) 4:00pm served in the parking garage, across from Nucor Corporation, corner of [place] and [place] by Ryland Group Works Ministries MONDAYS BREAKFAST 7:30am served in Nucor Corporation by the United States Steel Corporation and Friends LUNCH 10:30am - 12:30pm served at [employer], Liberty Global, [place] [address] (1.2 miles from [facility]) DINNER TWO LOCATIONS: 7:00pm served in front of the courthouse at the corner of Goldman Sachs and International Business Machines. by [facility] Monday Night Meal (3 blocks from [facility]) 4:30pm served at the AutoNation, [address] by The Procter & Gamble Not Bombs (0.6 miles from [facility]) [place] BREAKFAST 8:00am - 9:00am served at The TJX Companies, [address] (0.3 miles from [place]) LUNCH 10:30am - 12:30pm served at the [employer], Liberty Global [address] (1.2 miles from [place]) DINNER 6:00pm served at CSX Corporation, enter from Capital One and go to the Sonic Automotive, (0.7 miles from [place]) [facility] BREAKFAST 7:00am - 8:00am served at [employer], [employer] [address] (1.2 miles from [place]) LUNCH ONE LOCATION [plus two additional locations listed below] 10:30am - 12:30pm served at [employer], [employer] [address] (1.2 miles from [place]) (FIRST Wednesday ONLY) 11:30am served at Dillard's, [place] [address] (6.6 miles from [place]) (SECOND Wednesday ONLY) 11:00am served at [place]. Melvyn Novas of [address], [address] (1.3 miles from [place]) [place] TWO LOCATIONS 6:00pm served at W. R. Berkley, [place] W. Visteon Corporation. (1.3 miles from [facility]) 4:00pm - 6:00pm (hot dogs and chips) served at Levi Strauss of [facility], [address] (1.7 miles from [place]) [place] BREAKFAST NOT AVAILABLE AT THIS TIME LUNCH 10:30am - 12:30pm served at Ecol[employer], Liberty Global, [place] [address] (1.2 miles from [place]) DINNER 6:00pm served at CSX Corporation, enter from Capital One and go to the Sonic Automotive, (0.7 miles from Nucor Corporation) [place] BREAKFAST NOT AVAILABLE AT THIS TIME LUNCH 10:30am - 12:30pm served at [employer]col[employer], Liberty Global [address] (1.2 miles from [place]) DINNER TWO LOCATIONS, [plus one additional first Friday only] 6:00pm served under the bridge at [address] by Lindell Noe Under CSX Corporation. (.7 miles from [facility]) 5:00pm - 7:00pm served at Levi Strauss of [facility], [address] (1.7 miles from [place]) (FIRST Friday ONLY) 5:45 pm - SHUTTLE provided from the LIBRARY at 5:45pm. Served at [facility], [address]. SATURDAYS BREAKFAST TWO LOCATIONS [plus one additional last Saturday only] 8:00am served at [facility] by Gannett Co  Bikers 8:30am served at Pulte Homes, [address]. (2.2 miles from [facility]) (LAST Saturday ONLY) 8:30am served at  Beazer Homes, [address] (5 miles from [place]) LUNCH 10:30am - 12:30pm served at Ecolab, Liberty Global [address] (1.2 miles from [facility]) DINNER 6:00pm served under the bridge at [address] by World Fuel Services Corporation (0.7 miles from Nucor Corporation)  DIRECTIONS FROM [place] TO ALL MEAL LOCATIONS The Bridge at [address] (.7 miles from [address]) [address] on [place]. Turn Right onto DIRECTV 433 ft. Continue onto [address] under bridge, about 500 ft. Courthouse (3 blocks from [facility]) [place] on [address]. Turn right on [place] 1 block to PPL Corporation (.5 miles from [place]) Edison on [place]. YRC Worldwide. past Brink's Company to EMCOR. Enter from Capital One and go to the Affiliated Computer Services building W. R. Berkley [address] Visteon [address]. (1.3 miles from [facility]) [address] on [place]. Turn Right onto [address]. church will be on the Left. The TJX Companies [address] (.3 miles from [facility]) Go .3 miles on W. Friend[address] Destination is on your right Dillard's at ONEOK (6.6 miles from [address]) [address] on [place] toward W Friendly Turn right onto W Friendly Continue onto Alcoa Inc. Continue onto Toll Brothers. 5. Elesa Hacker is on right [facility] Conseco) [address] (.6 miles from [address]) [place] on [place] Turn Left onto [address] 0.3 miles Destination is on the Left. [place] Black & Decker at American Express (5 miles from Nucor Corporation) 1. Head south on [address]. Turn right onto [address] Friend[address] Turn slightly left onto Quest Diagnostics Continue onto Quest Diagnostics Turn right at Barnes & Noble Continue to church on right [place] Sounds [facility] [address] (1.7 miles  from [place]) [address] on [place] 1.4 miles [place] becomes [place]. El[address] Continue 0.6 miles and church will be on theright. [place] Guardian Life Insurance at [address] (2.5 miles from Nucor Corporation) [place] provided from Massachusetts Mutual Life Park] [place] on [place]. Elm toward Estée Lauder right onto Costco Wholesale left onto Henry Schein left onto Micron Technology [address] Southern Company (2.2 miles from [address]) [address] on [place] 1.4 miles [place] becomes [place]. Elm [address] Turn right onto W. [address] and church will be on the Left. Potter's House/[place] AT&T [address] (1.2 miles from [facility]) 1.Turn right onto [facility] 2.Turn left onto Rogue Jury 3.Reino Kent 4.Destination is on your right [place]. Melvyn Novas of [address] at ToysRus (1.3 miles from [facility]) [address] on [address] Turn left onto Genuine Parts right onto S. [place]. Continue onto KB Home	[place]. Turn left onto Smurfit-Stone Container. Turn right onto WellPoint.    [facility] assistance programs. If you are behind on your bills and expenses, and need some help to make it through a short term hardship or financial emergency, there are several organizations and charities in the [place] and [place] area that may  be able to help. They range from the Pathmark Stores, Liberty Global, Landscape architect of Weyerhaeuser Company and the local community action agency, the Intel, Avnet. These groups may be able to provide you resources to help pay your utility bills, rent, and they even offer housing assistance.  Crisis assistance program Find help for paying your rent, electric bills, free food, and even funds to pay your mortgage. The Liberty Global [phone]) offers several services to local families, as funding allows. The Emergency Assistance Program (EAP), which they administer, provides  household goods, free food, clothing, and financial aid to people in need in the [facility] area. The EAP program does have some qualification, and counselors will interview clients for financial assistance by written referral only. Referrals need to be made by the Department of Social Services or by other EAP approved human services agencies or charities in the area.  Money for resources for emergency assistance are available for security deposits for rent, water, electric, and gas, past due rent, utility bills, past due mortgage payments, food, and clothing. The Liberty Global also operates a Programme researcher, broadcasting/film/video on the site. More Liberty Global.  Open Door Ministries of [employer]gate[employer]Palmolive, which can be reached at [phone], offers emergency assistance programs for those in need of help, such as food, rent assistance, a soup kitchen, shelter, and clothing. They are based in [facility] but provide a number of services to those that qualify for assistance. Continue with Open Door Ministries programs.  [facility] Department of Social Services may be able to offer temporary financial assistance and cash grants for paying rent and utilities. Help may be provided for local county residents who may be experiencing personal crisis when other resources, including government programs, are not available. Call [phone]  [place]. [place] Society, which is based in [place], provides financial assistance of up to $50.00 to help pay for rent, utilities, cooling bills, rent, and prescription medications. The program also provides secondhand furniture to those in need. [phone]  Mattel is a Geneticist, molecular. The organization can offer emergency assistance for paying rent, electric bills, utilities, food, household products and furniture. They offer extensive emergency and transitional housing for families, children and  single women, and also run a Boy's and Dole Food. 301 Thrift Shops, CMS Energy Corporation, and other aid offered too. [address] [place], [place] [address], [phone]  Additional locations of the Pathmark Stores are in [place] and other nearby communities. When you have an emergency, need free food, money for basic needs, or just need assistance around Christmas, then the Pathmark Stores may have the resources you need. Or they can refer you to nearby agencies. Learn more.  [place] Low Income Risk manager - This is offered for [facility] families. The federal government created CIT Group Program provides a one-time cash grant payment to help eligible low-income families pay their electric and heating bills. [address] [place], [place] [address], [phone]  Government and Motorola - The county administers several emergency and self-sufficiency programs. Residents of [place] can get help with energy bills and food, rent, and other expenses. In addition, work with a Sports coach who may be able to help you find a job or improve your employment skills. More [place] public assistance.  [place] Emergency Assistance - A program offers emergency utility and rent funds  for greater [place] area residents. The program can also provide counseling and referrals to charities and government programs. Also provides food and a free meal program that serves lunch Mondays - Saturdays and dinner seven days per week to individuals in the community. [address] [place], [place] [address], [phone]  Parker Hannifin - Offers affordable apartment and housing communities across [place] and [place]. The low income and seniors can access public housing, rental assistance to qualified applicants, and apply for the section 8 rent subsidy program. Other programs include Chiropractor and  Engineer, maintenance. [address] [place], [place] [address], dial [phone].  Basic needs such as clothing - Low income families can receive free items (school supplies, clothes, holiday assistance, etc.) from clothing closets while more moderate income [address] families can shop at Caremark Rx. Locations across the area help the needy. Get information on [place] [facility] free clothing centers.  The [facility] provides transitional housing to veterans and the disabled. Clients will also access other services too, including life skills classes, case management, and assistance in finding permanent housing. [address] [place], [place] [address], call [phone]  [place] Transitional Housing in [place] is for people who were just evicted or that are formerly homeless. The non-profit will also help then gain self-sufficiency, find a home or apartment to live in, and also provides information on rent assistance when needed. Dial [phone]  [employer] Partnership to End Homelessness is available in [place]. Families that were evicted or that are homeless can gain shelter, food, clothing, furniture, and also emergency financial assistance. Other services include financial skills and life skills coaching, job training, and case management. [address] [place], [place] [address]. Telephone [phone].  The Dynegy, Avnet. runs the Ford Motor Company. This can help people save money on their heating and summer cooling bills, and is free to low income families. Free upgrades can be made to your home. Phone [phone]  Many of the non-profits and programs mentioned above are all inclusive, meaning they can meet many needs of the low income, such as energy bills, food, rent, and more. However there are several organizations that focus just on rent and housing. Read more on rent  assistance in [place] region.  Legal assistance for evictions, foreclosure, and more If you need free legal advice on civili issues, such as foreclosures, evictions, Electronics engineer, government programs, domestic issues and more, Armed forces operational officer Aid of [facility]) is a Associate Professor firm that provides free legal services and counsel to lower income people, seniors, disabled, and others. The goal is to ensure everyone has access to justice and fair representation.  Call them at [phone], or click here to learn more about [place] free legal assistance programs.  [place] Avnet and funds for emergency expenses The Pathmark Stores is another organization that can provide people with Deere & Company and funds to pay bills. Their assistance depends on funding, and the demand for help is always very high. They can provide cash to help pay rent, a missed mortgage payment, or gas, electric, and water bills. But the assistance doesn't stop there. They also have a food pantry on site, which can provide food once every three (3) months to people who need help. The KeyCorp can also offer a Engineering geologist once every three (3) months for a maximum three (3) times. After receiving this voucher over  that period of time, applicants can receive this aid one every six (6) months after that. [phone].  Kohl's action agency The Intel, Avnet. offers job and Dispensing optician. Resources are focused on helping students obtain the skills and experiences that are necessary to compete in today's challenging and tight job market. The non-profit faith-based community action agency offers internship trainings as well as classroom instruction. Economically disadvantaged and challenged individuals and potential employers can use their services. Classes are tailored to meet the needs of people in the [facility] region. [place], [place] [address],  [phone]    Foreclosure prevention services Housing Counseling and Education is also offered by MeadWestvaco of the [place]. The agency (phone number is below) is a Engineer, structural providing foreclosure advice and counseling. They offer mortgage resolution counseling and also reverse mortgage counseling. Counselors can direct people to both Kimberly-Clark, as well as Weyerhaeuser Company foreclosure assistance options.  Warehouse manager has locations in [place] and [employer]. They run debt and foreclosure prevention programs for local families. A sampling of the programs offered include both Budget and Housing Counseling. This includes money management, financial advice, budget review and development of a written action plan with a Pensions consultant to help solve specific individual financial problems. In addition, housing and mortgage counselors can also provide pre- and post-purchase homeownership counseling, default resolution counseling (to prevent foreclosure) and reverse mortgage counseling. A Debt Management Program allows people and families with a high level of credit card or medical debt to consolidate and repay consumer debt and loans to creditors and rebuild positive credit ratings and scores. [phone]  Debt assistance programs Receive free counseling and debt help from [facility] of the [place]. The [facility] based agency can be reached at [phone]. The counselors provide free help, and the services include budget counseling. This will help people manage their expenses and set goals. They also offer a Forensic scientist, which will help individuals consolidate their debts and become debt free. Most of the workshops and services are free.  Community clinics in [place] Five of the leading health and dental centers are listed below.  They may be able to provide medication, physicals, dental care, and general family care to residents of all incomes and backgrounds across the region. Some of the programs focus on the low income and underinsured. However if these clinics can't meet your needs, find information and details on more clinics in [facility].  Some of the options include Marriott of [employer]. This center provides free or low cost health care to low-income adults 18 - 64, who have no health insurance. Among other services offered include a pharmacy and eye clinic. Phone [phone]  [facility], which is located in [place], is a community clinic that provides primary medical and health care to uninsured and underinsured adults and families, as well as the low income, in the greater [place] area on a sliding-fee scale. Call [phone]  [place] Adult Dental Program - They run a dental assistance program that is organized by [facility] to provide dental services and aid to Sempra Energy. Services offered by the dental clinic are limited to extractions, pain management, and minor restorative care. [phone]  [place] [facility] Health has locations in [facility] and [place]. The community clinics provide complete pediatric care including primary health,  mental health, social work, neurology, cardiology, asthma. Dial [phone].  In addition to those [address] and Safeway Inc, find other free community clinics in [place] and across the county.  Food pantry and assistance Some of the local food pantries and distribution centers to call for free food and groceries include The Hive of [place]  (phone [phone]), The [facility] (phone [phone]) and also PPL Corporation. Dial [phone].  Several other food banks in the region provide clothing, free food and meals, access to  soup kitchens and other help. Find the addresses and phone numbers of more food pantries in [place]. [email or web address].[email or web address]_assistance_[email or web address]

## 2023-06-21 NOTE — Anesthesia Preprocedure Evaluation (Addendum)
Anesthesia Evaluation  Patient identified by MRN, date of birth, ID band Patient awake    Reviewed: Allergy & Precautions, NPO status , Patient's Chart, lab work & pertinent test results  History of Anesthesia Complications Negative for: history of anesthetic complications  Airway Mallampati: III  TM Distance: >3 FB Neck ROM: Full    Dental  (+) Dental Advisory Given, Missing   Pulmonary neg pulmonary ROS   Pulmonary exam normal        Cardiovascular hypertension, Pt. on medications Normal cardiovascular exam     Neuro/Psych  PSYCHIATRIC DISORDERS Anxiety     negative neurological ROS     GI/Hepatic ,GERD  ,,(+)     substance abuse  cocaine use and marijuana use  Endo/Other  diabetes, Type 2, Insulin Dependent    Renal/GU negative Renal ROS     Musculoskeletal  (+) Arthritis ,    Abdominal   Peds  Hematology negative hematology ROS (+)   Anesthesia Other Findings   Reproductive/Obstetrics                             Anesthesia Physical Anesthesia Plan  ASA: 3  Anesthesia Plan: General   Post-op Pain Management: Tylenol PO (pre-op)*   Induction: Intravenous  PONV Risk Score and Plan: 2 and Treatment may vary due to age or medical condition, Ondansetron, Dexamethasone and Midazolam  Airway Management Planned: LMA  Additional Equipment: None  Intra-op Plan:   Post-operative Plan: Extubation in OR  Informed Consent: I have reviewed the patients History and Physical, chart, labs and discussed the procedure including the risks, benefits and alternatives for the proposed anesthesia with the patient or authorized representative who has indicated his/her understanding and acceptance.     Dental advisory given  Plan Discussed with: CRNA and Anesthesiologist  Anesthesia Plan Comments:         Anesthesia Quick Evaluation

## 2023-06-21 NOTE — Brief Op Note (Signed)
06/20/2023 - 06/21/2023  8:52 PM  PATIENT:  Jeffrey Murphy  67 y.o. male  PRE-OPERATIVE DIAGNOSIS:  diabetic foot infection  POST-OPERATIVE DIAGNOSIS:  osteomyelitis right foot, severe diabetic infection and abscess  PROCEDURE:  Procedure(s) with comments: IRRIGATION AND DEBRIDEMENT FOOT (Right) APPLICATION OF WOUND VAC (Right) - POSSIBLE  SURGEON:  Surgeons and Role:    * Kaedin Hicklin, Rachelle Hora, DPM - Primary  PHYSICIAN ASSISTANT:   ASSISTANTS: none   ANESTHESIA:   local and MAC  EBL:  25 mL   BLOOD ADMINISTERED:none  DRAINS: VAC right foot   LOCAL MEDICATIONS USED:  MARCAINE    and Amount: 25 ml  SPECIMEN:  3rd metatarsal bone, abscess swab  DISPOSITION OF SPECIMEN:  path and micro metatarsal, micro swab  COUNTS:  YES  TOURNIQUET:  40 minutes around the ankle  DICTATION: .Note written in EPIC  PLAN OF CARE: Discharge to home after PACU  PATIENT DISPOSITION:  PACU - hemodynamically stable.   Delay start of Pharmacological VTE agent (>24hrs) due to surgical blood loss or risk of bleeding: yes  STRICT non weightbearing RLE Continue VAC Likely return to OR over weekend. Will need transmetatarsal amputation, had severe necrosis of plantar musculature

## 2023-06-21 NOTE — Transfer of Care (Signed)
Immediate Anesthesia Transfer of Care Note  Patient: Jeffrey Murphy  Procedure(s) Performed: IRRIGATION AND DEBRIDEMENT FOOT (Right) APPLICATION OF WOUND VAC (Right)  Patient Location: PACU  Anesthesia Type:MAC  Level of Consciousness: awake, alert , and oriented  Airway & Oxygen Therapy: Patient Spontanous Breathing  Post-op Assessment: Report given to RN and Post -op Vital signs reviewed and stable  Post vital signs: Reviewed and stable  Last Vitals:  Vitals Value Taken Time  BP 118/72 06/21/23 2104  Temp    Pulse 79 06/21/23 2105  Resp 20 06/21/23 2105  SpO2 97 % 06/21/23 2105  Vitals shown include unfiled device data.  Last Pain:  Vitals:   06/21/23 1729  TempSrc: Oral  PainSc: 9       Patients Stated Pain Goal: 5 (06/20/23 1946)  Complications: No notable events documented.

## 2023-06-21 NOTE — Progress Notes (Addendum)
Pharmacy Antibiotic Note  Jeffrey Murphy is a 67 y.o. male admitted on 06/20/2023 with right foot infection. She was on doxycycline PTA. Pharmacy has been consulted for vancomycin dosing; ceftriaxone and metronidazole also started.  Renal function is improving, afebrile, WBC downtrending.  Plan: Vanc 1250 IV Q12H for AUC 492 using SCr 0.8 Rocephin 2gm IV Q12H and Flagyl 500mg  IV Q12H per MD Monitor renal fxn, clinical progress, vanc levels if indicated F/u MRI result  Height: 5\' 9"  (175.3 cm) Weight: 86.2 kg (190 lb 0.6 oz) IBW/kg (Calculated) : 70.7  Temp (24hrs), Avg:98.4 F (36.9 C), Min:98.1 F (36.7 C), Max:98.7 F (37.1 C)  Recent Labs  Lab 06/20/23 1330 06/20/23 1335 06/20/23 1703 06/21/23 0329  WBC 15.6*  --   --  13.3*  CREATININE 0.93  --   --  0.70  LATICACIDVEN  --  1.0 2.2*  --     Estimated Creatinine Clearance: 101.5 mL/min (by C-G formula based on SCr of 0.7 mg/dL).    No Known Allergies  Vanc 8/13 >>  Rocephin 8/13 >>  Flagyl 8/13 >>  8/13 BCx -   Kerriann Kamphuis D. Laney Potash, PharmD, BCPS, BCCCP 06/21/2023, 7:34 AM  ==================================  Addendum: Broaden to cefepime to cover Pseudomonas per MD Cefepime 2gm IV Q8H  Carlyn Mullenbach D. Laney Potash, PharmD, BCPS, BCCCP 06/21/2023, 11:14 AM

## 2023-06-21 NOTE — Consult Note (Addendum)
Reason for Consult: diabetic foot infection R foot Referring Physician: Dr Laureen Ochs is an 67 y.o. male.  HPI: Patient is known to my partner Dr. Allena Katz and Dr. Logan Bores for previous right foot ulcerations, developed reulceration last week and presented to the office, had the ulceration D removed, the ulcer worsened over the weekend despite being on doxycycline.  He presented to urgent care and they recommend admission through the ER.  WBC on admission was 15,000, MRI was completed last night he says he is having foot pain from this    Past Medical History:  Diagnosis Date   Abscess 12/2016   LEFT INGUINAL ABSCESS   Arthritis    Diabetes mellitus without complication (HCC)    Hypertension    Wears glasses    reading    Past Surgical History:  Procedure Laterality Date   COLONOSCOPY     CYST REMOVAL NECK     INCISION AND DRAINAGE ABSCESS N/A 07/02/2022   Procedure: INCISION AND DRAINAGE ABSCESS;  Surgeon: Suzanna Obey, MD;  Location: [facility] OR;  Service: ENT;  Laterality: N/A;   INGUINAL HERNIA REPAIR     right   IRRIGATION AND DEBRIDEMENT ABSCESS Left 12/12/2016   Procedure: IRRIGATION AND DEBRIDEMENT ABSCESS;  Surgeon: Almond Lint, MD;  Location: [facility] OR;  Service: General;  Laterality: Left;   JOINT REPLACEMENT     right total knee   PILONIDAL CYST EXCISION     back   QUADRICEPS TENDON REPAIR Right 12/24/2014   Procedure: RIGHT QUADRICEP TENDON REPAIR ;  Surgeon: Cheral Almas, MD;  Location: [facility];  Service: Orthopedics;  Laterality: Right;   REPLACEMENT TOTAL KNEE      Family History  Problem Relation Age of Onset   Cancer Mother    Hypertension Mother    Diabetes Father     Social History:  reports that he has never smoked. He has never used smokeless tobacco. He reports that he does not currently use alcohol. He reports that he does not currently use drugs after having used the following drugs: Marijuana, Cocaine, and "Crack" cocaine.  Frequency: 5.00 times per week.  Allergies: No Known Allergies  Medications: I have reviewed the patient's current medications.  Results for orders placed or performed during the hospital encounter of 06/20/23 (from the past 48 hour(s))  Comprehensive metabolic panel     Status: Abnormal   Collection Time: 06/20/23  1:30 PM  Result Value Ref Range   Sodium 135 135 - 145 mmol/L   Potassium 4.0 3.5 - 5.1 mmol/L   Chloride 96 (L) 98 - 111 mmol/L   CO2 27 22 - 32 mmol/L   Glucose, Bld 203 (H) 70 - 99 mg/dL    Comment: Glucose reference range applies only to samples taken after fasting for at least 8 hours.   BUN 24 (H) 8 - 23 mg/dL   Creatinine, Ser 8.65 0.61 - 1.24 mg/dL   Calcium 9.4 8.9 - 78.4 mg/dL   Total Protein 8.2 (H) 6.5 - 8.1 g/dL   Albumin 3.5 3.5 - 5.0 g/dL   AST 12 (L) 15 - 41 U/L   ALT 10 0 - 44 U/L   Alkaline Phosphatase 75 38 - 126 U/L   Total Bilirubin 1.7 (H) 0.3 - 1.2 mg/dL   GFR, Estimated >69 >62 mL/min    Comment: (NOTE) Calculated using the CKD-EPI Creatinine Equation (2021)    Anion gap 12 5 - 15    Comment:  Performed at [facility], [address] [place], [place] [address]  CBC with Differential     Status: Abnormal   Collection Time: 06/20/23  1:30 PM  Result Value Ref Range   WBC 15.6 (H) 4.0 - 10.5 K/uL   RBC 5.22 4.22 - 5.81 MIL/uL   Hemoglobin 14.6 13.0 - 17.0 g/dL   HCT 60.4 54.0 - 98.1 %   MCV 84.5 80.0 - 100.0 fL   MCH 28.0 26.0 - 34.0 pg   MCHC 33.1 30.0 - 36.0 g/dL   RDW 19.1 47.8 - 29.5 %   Platelets 192 150 - 400 K/uL   nRBC 0.0 0.0 - 0.2 %   Neutrophils Relative % 83 %   Neutro Abs 13.0 (H) 1.7 - 7.7 K/uL   Lymphocytes Relative 9 %   Lymphs Abs 1.4 0.7 - 4.0 K/uL   Monocytes Relative 6 %   Monocytes Absolute 0.9 0.1 - 1.0 K/uL   Eosinophils Relative 2 %   Eosinophils Absolute 0.3 0.0 - 0.5 K/uL   Basophils Relative 0 %   Basophils Absolute 0.0 0.0 - 0.1 K/uL   Immature Granulocytes 0 %   Abs Immature Granulocytes 0.06  0.00 - 0.07 K/uL    Comment: Performed at [facility], [address] [place], [place] [address]  I-Stat Lactic Acid, ED     Status: None   Collection Time: 06/20/23  1:35 PM  Result Value Ref Range   Lactic Acid, Venous 1.0 0.5 - 1.9 mmol/L  Sedimentation rate     Status: Abnormal   Collection Time: 06/20/23  4:46 PM  Result Value Ref Range   Sed Rate 53 (H) 0 - 16 mm/hr    Comment: Performed at [facility], [address], [place], [place] [address]  C-reactive protein     Status: Abnormal   Collection Time: 06/20/23  4:46 PM  Result Value Ref Range   CRP 23.8 (H) <1.0 mg/dL    Comment: Performed at [facility], [address] [place], [place] [address]  I-Stat Lactic Acid, ED     Status: Abnormal   Collection Time: 06/20/23  5:03 PM  Result Value Ref Range   Lactic Acid, Venous 2.2 (HH) 0.5 - 1.9 mmol/L   Comment NOTIFIED PHYSICIAN   Glucose, capillary     Status: Abnormal   Collection Time: 06/20/23  7:56 PM  Result Value Ref Range   Glucose-Capillary 187 (H) 70 - 99 mg/dL    Comment: Glucose reference range applies only to samples taken after fasting for at least 8 hours.  Urinalysis, Routine w reflex microscopic -Urine, Clean Catch     Status: Abnormal   Collection Time: 06/20/23  9:32 PM  Result Value Ref Range   Color, Urine YELLOW YELLOW   APPearance CLEAR CLEAR   Specific Gravity, Urine 1.026 1.005 - 1.030   pH 5.0 5.0 - 8.0   Glucose, UA 50 (A) NEGATIVE mg/dL   Hgb urine dipstick NEGATIVE NEGATIVE   Bilirubin Urine NEGATIVE NEGATIVE   Ketones, ur NEGATIVE NEGATIVE mg/dL   Protein, ur NEGATIVE NEGATIVE mg/dL   Nitrite NEGATIVE NEGATIVE   Leukocytes,Ua NEGATIVE NEGATIVE    Comment: Performed at [facility], [address], [place], [place] [address]  Rapid urine drug screen (hospital performed)     Status: Abnormal   Collection Time: 06/20/23  9:33 PM  Result Value Ref Range   Opiates NONE DETECTED NONE DETECTED  Cocaine POSITIVE  (A) NONE DETECTED   Benzodiazepines NONE DETECTED NONE DETECTED   Amphetamines NONE DETECTED NONE DETECTED   Tetrahydrocannabinol NONE DETECTED NONE DETECTED   Barbiturates NONE DETECTED NONE DETECTED    Comment: (NOTE) DRUG SCREEN FOR MEDICAL PURPOSES ONLY.  IF CONFIRMATION IS NEEDED FOR ANY PURPOSE, NOTIFY LAB WITHIN 5 DAYS.  LOWEST DETECTABLE LIMITS FOR URINE DRUG SCREEN Drug Class                     Cutoff (ng/mL) Amphetamine and metabolites    1000 Barbiturate and metabolites    200 Benzodiazepine                 200 Opiates and metabolites        300 Cocaine and metabolites        300 THC                            50 Performed at [facility], [address] [place], [place] [address]   APTT     Status: None   Collection Time: 06/21/23  3:28 AM  Result Value Ref Range   aPTT 36 24 - 36 seconds    Comment: Performed at [facility], [address], [place], [place] [address]  Hemoglobin A1c     Status: Abnormal   Collection Time: 06/21/23  3:29 AM  Result Value Ref Range   Hgb A1c MFr Bld 8.2 (H) 4.8 - 5.6 %    Comment: (NOTE) Pre diabetes:          5.7%-6.4%  Diabetes:              >6.4%  Glycemic control for   <7.0% adults with diabetes    Mean Plasma Glucose 188.64 mg/dL    Comment: Performed at [facility], [address] [place], [place] [address]  Comprehensive metabolic panel     Status: Abnormal   Collection Time: 06/21/23  3:29 AM  Result Value Ref Range   Sodium 136 135 - 145 mmol/L   Potassium 3.2 (L) 3.5 - 5.1 mmol/L   Chloride 100 98 - 111 mmol/L   CO2 25 22 - 32 mmol/L   Glucose, Bld 84 70 - 99 mg/dL    Comment: Glucose reference range applies only to samples taken after fasting for at least 8 hours.   BUN 14 8 - 23 mg/dL   Creatinine, Ser 2.13 0.61 - 1.24 mg/dL   Calcium 8.8 (L) 8.9 - 10.3 mg/dL   Total Protein 7.6 6.5 - 8.1 g/dL   Albumin 3.0 (L) 3.5 - 5.0 g/dL   AST 15 15 - 41 U/L   ALT 10 0 - 44 U/L   Alkaline  Phosphatase 72 38 - 126 U/L   Total Bilirubin 1.1 0.3 - 1.2 mg/dL   GFR, Estimated >08 >65 mL/min    Comment: (NOTE) Calculated using the CKD-EPI Creatinine Equation (2021)    Anion gap 11 5 - 15    Comment: Performed at [facility], [address] [place], [place] [address]  CBC     Status: Abnormal   Collection Time: 06/21/23  3:29 AM  Result Value Ref Range   WBC 13.3 (H) 4.0 - 10.5 K/uL   RBC 4.85 4.22 - 5.81 MIL/uL   Hemoglobin 14.1 13.0 - 17.0 g/dL   HCT 62.9 52.8 - 41.3 %   MCV  85.6 80.0 - 100.0 fL   MCH 29.1 26.0 - 34.0 pg   MCHC 34.0 30.0 - 36.0 g/dL   RDW 16.1 09.6 - 04.5 %   Platelets 181 150 - 400 K/uL   nRBC 0.0 0.0 - 0.2 %    Comment: Performed at [facility], [address], [place], [place] [address]    DG Foot Complete Right  Result Date: 06/20/2023 CLINICAL DATA:  Foot ulcer. EXAM: RIGHT FOOT COMPLETE - 3+ VIEW COMPARISON:  07/14/2022 FINDINGS: Hallux valgus deformity. Negative for fracture or dislocation. Calcifications or ossifications along the plantar soft tissues. Calcaneal enthesopathic changes at the Achilles tendon insertion site. No evidence for bone destruction or osteomyelitis. IMPRESSION: 1. No acute bone abnormality to the right foot. 2. Hallux valgus deformity. Electronically Signed   By: Richarda Overlie M.D.   On: 06/20/2023 16:54    Review of Systems  Constitutional:  Positive for chills, fever and malaise/fatigue.  Respiratory:  Negative for shortness of breath.   Cardiovascular:  Positive for leg swelling. Negative for chest pain.  Musculoskeletal:  Positive for joint pain.  Skin:        Draining wound right foot  All other systems reviewed and are negative.  Blood pressure 116/64, pulse 92, temperature 98.1 F (36.7 C), resp. rate 16, height 5\' 9"  (1.753 m), weight 86.2 kg, SpO2 96%.  Vitals:   06/20/23 1954 06/21/23 0624  BP: 131/78 116/64  Pulse: 92   Resp:    Temp: 98.2 F (36.8 C) 98.1 F (36.7 C)  SpO2: 99% 96%     General AA&O x3. Normal mood and affect.  Vascular Dorsalis pedis and posterior tibial pulses  present 2+ right  Capillary refill normal to all digits. Pedal hair growth normal.  Neurologic Epicritic sensation grossly absent.  Dermatologic (Wound) Full-thickness necrotic purulent ulcer on the right foot submetatarsal 3, probes directly to joint  Orthopedic: Motor intact BLE.   Radiology interpretation of MRI is pending but I reviewed the images and there appears to be at least osteomyelitis of the third metatarsal head and a large abscess plantar.  Possible osteolysis of the base of third toe as well    Assessment/Plan:  Right foot diabetic foot infection osteomyelitis -Imaging: Studies independently reviewed.  Pending results of MRI, my interpretation noted above. -Antibiotics: He is on ceftriaxone Flagyl and vancomycin, given the severity of the infection switching the ceftriaxone to cefepime likely will have better coverage against pseudomonal infection -WB Status: NWB RLE -Surgical Plan: to  OR tonight for I&D and bone resection, discussed with him that we will try to see if the toe is manageable but may at least be losing the third toe depending on the results of the MRI and severity of infection intraoperative.  Consent order placed.  All questions addressed. -N.p.o. past 9 AM, he may eat breakfast, surgery will be after 5 PM today  Edwin Cap 06/21/2023, 7:32 AM   Best available via secure chat for questions or concerns.

## 2023-06-22 ENCOUNTER — Encounter (HOSPITAL_COMMUNITY): Payer: Self-pay | Admitting: Podiatry

## 2023-06-22 DIAGNOSIS — M86171 Other acute osteomyelitis, right ankle and foot: Secondary | ICD-10-CM | POA: Diagnosis not present

## 2023-06-22 DIAGNOSIS — E11628 Type 2 diabetes mellitus with other skin complications: Secondary | ICD-10-CM | POA: Diagnosis not present

## 2023-06-22 DIAGNOSIS — L089 Local infection of the skin and subcutaneous tissue, unspecified: Secondary | ICD-10-CM | POA: Diagnosis not present

## 2023-06-22 LAB — BASIC METABOLIC PANEL
Anion gap: 8 (ref 5–15)
BUN: 10 mg/dL (ref 8–23)
CO2: 27 mmol/L (ref 22–32)
Calcium: 8.8 mg/dL — ABNORMAL LOW (ref 8.9–10.3)
Chloride: 99 mmol/L (ref 98–111)
Creatinine, Ser: 0.72 mg/dL (ref 0.61–1.24)
GFR, Estimated: >60 mL/min (ref >60)
Glucose, Bld: 272 mg/dL — ABNORMAL HIGH (ref 70–99)
Potassium: 3.6 mmol/L (ref 3.5–5.1)
Sodium: 134 mmol/L — ABNORMAL LOW (ref 135–145)

## 2023-06-22 LAB — CBC
HCT: 40.7 % (ref 39.0–52.0)
Hemoglobin: 13.4 g/dL (ref 13.0–17.0)
MCH: 28.4 pg (ref 26.0–34.0)
MCHC: 32.9 g/dL (ref 30.0–36.0)
MCV: 86.2 fL (ref 80.0–100.0)
Platelets: 177 10*3/uL (ref 150–400)
RBC: 4.72 MIL/uL (ref 4.22–5.81)
RDW: 13.2 % (ref 11.5–15.5)
WBC: 9.4 10*3/uL (ref 4.0–10.5)
nRBC: 0 % (ref 0.0–0.2)

## 2023-06-22 LAB — GLUCOSE, CAPILLARY
Glucose-Capillary: 148 mg/dL — ABNORMAL HIGH (ref 70–99)
Glucose-Capillary: 191 mg/dL — ABNORMAL HIGH (ref 70–99)
Glucose-Capillary: 199 mg/dL — ABNORMAL HIGH (ref 70–99)
Glucose-Capillary: 187 mg/dL — ABNORMAL HIGH (ref 70–99)

## 2023-06-22 LAB — MAGNESIUM: Magnesium: 1.7 mg/dL (ref 1.7–2.4)

## 2023-06-22 MED ORDER — INSULIN ASPART 100 UNIT/ML IJ SOLN
0.0000 [IU] | Freq: Three times a day (TID) | INTRAMUSCULAR | Status: DC
  Administered 2023-06-22 – 2023-06-23 (×4): 3 [IU] via SUBCUTANEOUS
  Administered 2023-06-24: 2 [IU] via SUBCUTANEOUS
  Administered 2023-06-24: 3 [IU] via SUBCUTANEOUS
  Administered 2023-06-24: 5 [IU] via SUBCUTANEOUS
  Administered 2023-06-25: 11 [IU] via SUBCUTANEOUS
  Administered 2023-06-25: 5 [IU] via SUBCUTANEOUS
  Administered 2023-06-26: 8 [IU] via SUBCUTANEOUS
  Administered 2023-06-26: 3 [IU] via SUBCUTANEOUS
  Administered 2023-06-26: 5 [IU] via SUBCUTANEOUS
  Administered 2023-06-27 – 2023-06-29 (×5): 3 [IU] via SUBCUTANEOUS
  Administered 2023-06-29: 5 [IU] via SUBCUTANEOUS

## 2023-06-22 MED ORDER — INSULIN ASPART 100 UNIT/ML IJ SOLN
5.0000 [IU] | Freq: Three times a day (TID) | INTRAMUSCULAR | Status: DC
  Administered 2023-06-22 – 2023-06-26 (×10): 5 [IU] via SUBCUTANEOUS

## 2023-06-22 MED ORDER — MORPHINE SULFATE (PF) 2 MG/ML IV SOLN
2.0000 mg | Freq: Once | INTRAVENOUS | Status: AC
  Administered 2023-06-22: 2 mg via INTRAVENOUS
  Filled 2023-06-22: qty 1

## 2023-06-22 MED ORDER — TRAZODONE HCL 50 MG PO TABS
50.0000 mg | ORAL_TABLET | Freq: Every day | ORAL | Status: DC
  Administered 2023-06-22 – 2023-06-28 (×8): 50 mg via ORAL
  Filled 2023-06-22 (×8): qty 1

## 2023-06-22 MED ORDER — INSULIN ASPART 100 UNIT/ML IJ SOLN
0.0000 [IU] | Freq: Every day | INTRAMUSCULAR | Status: DC
  Administered 2023-06-25: 4 [IU] via SUBCUTANEOUS
  Administered 2023-06-26 – 2023-06-27 (×2): 2 [IU] via SUBCUTANEOUS

## 2023-06-22 MED ORDER — OXYCODONE HCL 5 MG PO TABS
5.0000 mg | ORAL_TABLET | ORAL | Status: DC | PRN
  Administered 2023-06-22 – 2023-06-25 (×12): 10 mg via ORAL
  Administered 2023-06-26: 5 mg via ORAL
  Administered 2023-06-26 – 2023-06-29 (×14): 10 mg via ORAL
  Filled 2023-06-22 (×8): qty 2
  Filled 2023-06-22: qty 1
  Filled 2023-06-22 (×19): qty 2

## 2023-06-22 MED ORDER — INSULIN ASPART PROT & ASPART (70-30 MIX) 100 UNIT/ML ~~LOC~~ SUSP
30.0000 [IU] | Freq: Two times a day (BID) | SUBCUTANEOUS | Status: DC
  Administered 2023-06-22 (×2): 30 [IU] via SUBCUTANEOUS
  Filled 2023-06-22: qty 10

## 2023-06-22 MED ORDER — INSULIN GLARGINE-YFGN 100 UNIT/ML ~~LOC~~ SOLN
40.0000 [IU] | Freq: Every day | SUBCUTANEOUS | Status: DC
  Administered 2023-06-22 – 2023-06-28 (×7): 40 [IU] via SUBCUTANEOUS
  Filled 2023-06-22 (×9): qty 0.4

## 2023-06-22 MED ORDER — MORPHINE SULFATE (PF) 2 MG/ML IV SOLN
1.0000 mg | INTRAVENOUS | Status: DC | PRN
  Administered 2023-06-22 – 2023-06-28 (×2): 1 mg via INTRAVENOUS
  Filled 2023-06-22 (×2): qty 1

## 2023-06-22 NOTE — Op Note (Addendum)
Patient Name: Jeffrey Murphy DOB: 06/26/1956  MRN: [id number]   Date of Service: 06/20/2023 - 06/21/2023  Surgeon: Dr. Sharl Ma, DPM Assistants: None Pre-operative Diagnosis:  diabetic foot infection Post-operative Diagnosis:  Diabetic foot infection right foot Osteomyelitis right foot Severe abscess right foot Procedures: Incision and drainage of right foot with resection of third metatarsal and proximal phalanx Bone biopsy Application of negative pressure wound therapy Application of absorbable antibiotic beads Pathology/Specimens: ID Type Source Tests Collected by Time Destination  1 : Right third ray- ([id number]) with result Tissue PATH Bone resection SURGICAL PATHOLOGY Edwin Cap, DPM 06/21/2023 2045   A : Right foot abscess Abscess Abscess AEROBIC/ANAEROBIC CULTURE W GRAM STAIN (SURGICAL/DEEP WOUND) Edwin Cap, DPM 06/21/2023 2025   B : Right third ray Tissue Bone AEROBIC/ANAEROBIC CULTURE W Romie Minus STAIN (SURGICAL/DEEP WOUND) Edwin Cap, DPM 06/21/2023 2049    Anesthesia: MAC with local Hemostasis:  Total Tourniquet Time Documented: Calf (Right) - 40 minutes Total: Calf (Right) - 40 minutes  Estimated Blood Loss: 25 mL Materials:  Implant Name Type Inv. Item Serial No. Manufacturer Lot No. LRB No. Used Action  KIT STIMULAN RAPID CURE 5CC - [id number] Orthopedic Implant KIT STIMULAN RAPID CURE 5CC  [employer] [id number] Right 1 Implanted   Medications: 20 cc 0.5% Marcaine plain, 500 mg vancomycin and 80 mg of gentamicin instilled into beads Complications: No complication noted  Indications for Procedure:  This is a 67 y.o. male with a history of uncontrolled type 2 diabetes and chronic neuropathic ulcerations, his ulcers acutely worsened, he presented to the emergency room for admission after failing outpatient antibiotics and MRI revealed abscess of the right plantar foot and osteomyelitis.   Procedure in Detail: Patient was identified in  pre-operative holding area. Formal consent was signed and the right lower extremity was marked. Patient was brought back to the operating room. Anesthesia was induced. The extremity was prepped and draped in the usual sterile fashion. Timeout was taken to confirm patient name, laterality, and procedure prior to incision.   Attention was then directed to the right foot which exhibited a large submetatarsal 3 ulceration that was necrotic malodorous and had severe green and white purulent drainage.  An ankle block was performed with Marcaine.  The tourniquet was inflated and an incision with an ellipse of the necrotic tissue was created on the plantar forefoot.  A large amount of purulent drainage began to emanate from this.  After excising the skin wedge of necrotic tissue, I began to explore the plantar vault of the foot.  There was severe necrosis and pyomyositis of the plantar muscular sure.  The majority of the flexor digitorum brevis and plantar interosseous muscles were dead and necrotic.  Purulent drainage emanated also from the first and second interspace.  The third metatarsal phalangeal joint was exposed and the bone appeared to be soft.  I began by wide thorough excisional debridement of all the necrotic soft tissue's and exploration of the plantar vault with removal of any necrotic and dead tissue and purulent drainage.  The majority of the plantar tendons were affected as well.  A sagittal saw was then used to resect the third metatarsal head and proximal phalanx.  The bone did not appear to be healthy.  It was sent for tissue culture and pathology.  The purulent drainage was cultured as well.  Pulse lavage was then used to thoroughly irrigate the plantar vault until no further purulence remained.  Antibiotic beads were fashioned with stimulant  impregnated with 500 mg of vancomycin and 80 mg of gentamicin.  These were placed into the wound bed and a negative pressure wound therapy device was applied.   An Ace wrap was applied as well.  Patient tolerated the procedure well.  He is at high risk of further limb loss and I expect likely he will require transmetatarsal amputation for wound closure and surgical cure of his infection.   Disposition: Following a period of post-operative monitoring, patient will be transferred to the floor.

## 2023-06-22 NOTE — Anesthesia Postprocedure Evaluation (Signed)
Anesthesia Post Note  Patient: Jeffrey Murphy  Procedure(s) Performed: IRRIGATION AND DEBRIDEMENT FOOT, CULTURES, BONE CULTURE AND PATHOLOGY (Right: Foot) APPLICATION OF WOUND VAC (Right: Foot)     Patient location during evaluation: PACU Anesthesia Type: MAC Level of consciousness: awake and alert Pain management: pain level controlled Vital Signs Assessment: post-procedure vital signs reviewed and stable Respiratory status: spontaneous breathing, nonlabored ventilation, respiratory function stable and patient connected to nasal cannula oxygen Cardiovascular status: stable and blood pressure returned to baseline Postop Assessment: no apparent nausea or vomiting Anesthetic complications: no   No notable events documented.  Last Vitals:  Vitals:   06/21/23 2130 06/21/23 2156  BP: 116/75 107/72  Pulse: 75 79  Resp: 11 20  Temp: 36.8 C 37.2 C  SpO2: 93% 97%    Last Pain:  Vitals:   06/21/23 2130  TempSrc:   PainSc: 4                  [name]

## 2023-06-22 NOTE — TOC Progression Note (Signed)
Transition of Care (TOC) - Progression Note   Lynette with [facility] agreeable to provide home health services at discharge.   Await plan of care and PT evaluation.   Secure chatted surgeon patient will most likely not need VAC for home  Patient Details  Name: Jeffrey Murphy MRN: [id number] Date of Birth: December 19, 1955  Transition of Care [facility]) CM/SW Contact  Da Authement, Adria Devon, RN Phone Number: 06/22/2023, 10:38 AM  Clinical Narrative:       Expected Discharge Plan: Home w Home Health Services [facility] reviewing if they can accept him back, see note)    Expected Discharge Plan and Services   Discharge Planning Services: CM Consult Post Acute Care Choice: Home Health Living arrangements for the past 2 months: Single Family Home                             HH Agency:  [name]) Date HH Agency Contacted: 06/21/23 Time HH Agency Contacted: 1042 Representative spoke with at [facility] Agency: [name]   Social Determinants of Health (SDOH) Interventions SDOH Screenings   Food Insecurity: Food Insecurity Present (06/20/2023)  Housing: Low Risk  (06/21/2023)  Recent Concern: Housing - Medium Risk (06/20/2023)  Transportation Needs: Unmet Transportation Needs (06/21/2023)  Utilities: At Risk (06/20/2023)  Depression (PHQ2-9): Medium Risk (05/29/2019)  Tobacco Use: Low Risk  (06/21/2023)    Readmission Risk Interventions    06/29/2022   11:40 AM  Readmission Risk Prevention Plan  Transportation Screening Complete  PCP or Specialist Appt within 5-7 Days Complete  Home Care Screening Complete  Medication Review (RN CM) Complete

## 2023-06-22 NOTE — Progress Notes (Signed)
[facility] PROGRESS NOTE  Jeffrey Murphy (DOB: 29-Nov-1955) WUJ:[id number] PCP: [name], NP  Brief Narrative: HPI: Jeffrey Murphy is a 67 y.o. male with medical history significant of  DMII, Hypertension , who has interim history of Right submetatarsal 2 friction blister with underlying ulceration with fat layer exposed s/p  drainage of blister on 8/7 s/p which patient was given doxycycline for skin and soft tissue infection prophylaxis.  Patient notes around 48 hours ago he noted increase redness swelling,drain and odor from area. He presents to ED due to persistent swelling with concern for infected ulcer. Patient notes no fever/n/v/d but states he had mild chills. He denies any sob/cough / chest pain or other complaints   ED Course: Vitals:afeb, bp121/87, hr 109 rr 16 sat 975  Labs Lactic 2.2  ESR 53, crp 23.8 Na 135, K 4, Cl 96, bicarb 27, cr 0.93 Wbc 15.6, hgb 14.6,  plt 192 RLE xray:IMPRESSION: 1. No acute bone abnormality to the right foot. 2. Hallux valgus deformity.  Subjective: Pain uncontrolled, dose not taking adequate care of pain when he gets it and seems to wear off after 4 hours. Prefers oxycodone to IV morphine.   Objective: BP 135/77 (BP Location: Right Arm)   Pulse 92   Temp 98.4 F (36.9 C) (Oral)   Resp 17   Ht 5\' 9"  (1.753 m)   Wt 86 kg   SpO2 96%   BMI 28.00 kg/m   Gen: No distress, chronically ill-appearing male  Pulm: Clear, nonlabored  CV: RRR, no MRG. GI: Soft, NT, ND, +BS Neuro: Alert and oriented. No new focal deficits. Ext: Warm, no deformities. Right LE edematous distally with ACE wrap in place around foot with wound vac, sanguinous output, no odor.    Assessment & Plan: Right 3rd metatarsal acute osteomyelitis with associated septic joint and plantar abscess:  - Continue broad IV antibiotics, broadened to include antipseudomonal cephalosporin pending surgical plan/findings. Will follow for speciation of wound cultures. Appreciate  podiatry management/assistance. TMA recommended to pt who is amenable.  - Monitor blood cultures.  - Pain control including gabapentin, oxycodone, increase to 5-10mg  po q4h prn mod-severe pain.   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 - Continued on home insulin and SSI with erratic control. Will convert 30u BID of 70/30 ( which = 42u long / 18u short TDD) into 40u glargine qHS, 5u TIDWC + mod SSI and HS coverage.    HLD:  - Continue statin  Hx polysubstance abuse:  - UDS 8/13 positive for cocaine, otherwise negative. Cessation counseling provided.   [name], MD [facility] [email or web address] 06/22/2023, 11:49 AM

## 2023-06-22 NOTE — Progress Notes (Signed)
Subjective:  Patient ID: Jeffrey Murphy, male    DOB: 12-18-1955,  MRN: [id number]  POD #1 right foot I&D, application of antibiotic beads and wound VAC  Negative for chest pain and shortness of breath Objective:   Vitals:   06/21/23 2300 06/22/23 0626  BP: 132/72 133/76  Pulse: 87 89  Resp: 20 20  Temp: 97.8 F (36.6 C) 98.3 F (36.8 C)  SpO2: 96% 97%   General AA&O x3. Normal mood and affect.  Vascular Foot is warm and well-perfused, some coolness and pallor to the third toe  Neurologic Epicritic sensation grossly reduced.  Dermatologic Dressing on and intact, wound VAC operating with 100 cc blood in canister  Orthopedic: MMT 5/5 in dorsiflexion, plantarflexion, inversion, and eversion. Normal joint ROM without pain or crepitus.   Recent Results (from the past 240 hour(s))  Culture, blood (routine x 2)     Status: None (Preliminary result)   Collection Time: 06/20/23  3:29 PM   Specimen: BLOOD  Result Value Ref Range Status   Specimen Description BLOOD SITE NOT SPECIFIED  Final   Special Requests   Final    BOTTLES DRAWN AEROBIC AND ANAEROBIC Blood Culture adequate volume   Culture   Final    NO GROWTH < 24 HOURS Performed at [facility], [address] [place], [place] [address]    Report Status PENDING  Incomplete  Culture, blood (routine x 2)     Status: None (Preliminary result)   Collection Time: 06/20/23  4:46 PM   Specimen: BLOOD RIGHT HAND  Result Value Ref Range Status   Specimen Description BLOOD RIGHT HAND  Final   Special Requests   Final    BOTTLES DRAWN AEROBIC AND ANAEROBIC Blood Culture adequate volume   Culture   Final    NO GROWTH < 24 HOURS Performed at [facility], [address] [place], [place] [address]    Report Status PENDING  Incomplete  MRSA Next Gen by PCR, Nasal     Status: None   Collection Time: 06/21/23  8:43 AM   Specimen: Nasal Mucosa; Nasal Swab  Result Value Ref Range Status   MRSA by PCR Next Gen NOT DETECTED  NOT DETECTED Final    Comment: (NOTE) The GeneXpert MRSA Assay (FDA approved for NASAL specimens only), is one component of a comprehensive MRSA colonization surveillance program. It is not intended to diagnose MRSA infection nor to guide or monitor treatment for MRSA infections. Test performance is not FDA approved in patients less than 2 years old. Performed at [facility], [address], [place], [place] [address]   Aerobic/Anaerobic Culture w Gram Stain (surgical/deep wound)     Status: None (Preliminary result)   Collection Time: 06/21/23  8:49 PM   Specimen: Abscess  Result Value Ref Range Status   Specimen Description ABSCESS  Final   Special Requests RIGHT FOOT  Final   Gram Stain   Final    ABUNDANT WBC PRESENT, PREDOMINANTLY PMN FEW GRAM POSITIVE COCCI IN PAIRS FEW GRAM POSITIVE RODS    Culture   Final    NO GROWTH < 12 HOURS Performed at [facility], [address], [place], [place] [address]    Report Status PENDING  Incomplete  Aerobic/Anaerobic Culture w Gram Stain (surgical/deep wound)     Status: None (Preliminary result)   Collection Time: 06/21/23  8:49 PM   Specimen: Bone; Tissue  Result Value Ref Range Status  Specimen Description BONE  Final   Special Requests NONE  Final   Gram Stain   Final    RARE WBC PRESENT, PREDOMINANTLY PMN FEW GRAM POSITIVE COCCI Performed at [facility], [address], [place], [place] [address]    Culture PENDING  Incomplete   Report Status PENDING  Incomplete    Assessment & Plan:  Patient was evaluated and treated and all questions answered.  Osteomyelitis, diabetic foot infection of right foot -Cultures with GPC's and GNR's, bone culture with GPC's -Path is pending -Nonweightbearing right lower extremity -Continue wound VAC 125 mmHg continuous pressure -I discussed with him his prognosis for limb salvage.  We discussed that the majority of the plantar vault including the musculature that  controls toe function and stability of the arch was necrotic and required excision.  I discussed with him that my recommendation will be to proceed with transmetatarsal amputation to allow for complete soft tissue closure and ambulation with a toe filler.  He says he likely is amenable to this but would like to think about it.  Will continue to follow  Edwin Cap, DPM  Accessible via secure chat for questions or concerns.

## 2023-06-22 NOTE — Plan of Care (Signed)

## 2023-06-23 DIAGNOSIS — L089 Local infection of the skin and subcutaneous tissue, unspecified: Secondary | ICD-10-CM | POA: Diagnosis not present

## 2023-06-23 DIAGNOSIS — E11628 Type 2 diabetes mellitus with other skin complications: Secondary | ICD-10-CM | POA: Diagnosis not present

## 2023-06-23 LAB — BASIC METABOLIC PANEL
Anion gap: 10 (ref 5–15)
BUN: 10 mg/dL (ref 8–23)
CO2: 29 mmol/L (ref 22–32)
Calcium: 9.1 mg/dL (ref 8.9–10.3)
Chloride: 95 mmol/L — ABNORMAL LOW (ref 98–111)
Creatinine, Ser: 0.78 mg/dL (ref 0.61–1.24)
GFR, Estimated: >60 mL/min (ref >60)
Glucose, Bld: 170 mg/dL — ABNORMAL HIGH (ref 70–99)
Potassium: 3.6 mmol/L (ref 3.5–5.1)
Sodium: 134 mmol/L — ABNORMAL LOW (ref 135–145)

## 2023-06-23 LAB — GLUCOSE, CAPILLARY
Glucose-Capillary: 167 mg/dL — ABNORMAL HIGH (ref 70–99)
Glucose-Capillary: 156 mg/dL — ABNORMAL HIGH (ref 70–99)
Glucose-Capillary: 183 mg/dL — ABNORMAL HIGH (ref 70–99)
Glucose-Capillary: 165 mg/dL — ABNORMAL HIGH (ref 70–99)

## 2023-06-23 LAB — SURGICAL PATHOLOGY

## 2023-06-23 NOTE — Plan of Care (Signed)

## 2023-06-23 NOTE — Evaluation (Signed)
Physical Therapy Evaluation Patient Details Name: Jeffrey Murphy MRN: [id number] DOB: 02-23-1956 Today's Date: 06/23/2023  History of Present Illness  Pt is a 67 year old male admitted on 06/20/23 due to persistent swelling with concern for infected ulcer. Past medical history significant of  DMII, Hypertension , who has interim history of Right submetatarsal 2 friction blister with underlying ulceration with fat layer exposed s/p  drainage of blister on 8/7 s/p which patient was given doxycycline for skin and soft tissue infection prophylaxis.  Clinical Impression  Pt presents with admitting diagnosis above. Pt today was able to stand and take sidesteps along EOB with RW Min A. Pt required constant cues for safety and proximity to RW. Recommend SNF upon DC. PT will continue to follow. Pt anticipates R TMA in the next few days.        If plan is discharge home, recommend the following: A lot of help with walking and/or transfers;A little help with bathing/dressing/bathroom;Assistance with cooking/housework;Direct supervision/assist for medications management;Assist for transportation;Help with stairs or ramp for entrance   Can travel by private vehicle   No    Equipment Recommendations Other (comment) (Per accepting facility)  Recommendations for Other Services       Functional Status Assessment Patient has had a recent decline in their functional status and demonstrates the ability to make significant improvements in function in a reasonable and predictable amount of time.     Precautions / Restrictions Precautions Precautions: Fall Restrictions Weight Bearing Restrictions: Yes RLE Weight Bearing: Non weight bearing      Mobility  Bed Mobility Overal bed mobility: Needs Assistance Bed Mobility: Supine to Sit, Sit to Supine     Supine to sit: Supervision Sit to supine: Supervision   General bed mobility comments: Supervision for safety and managing wound vac.     Transfers Overall transfer level: Needs assistance Equipment used: Rolling walker (2 wheels) Transfers: Sit to/from Stand Sit to Stand: Min assist           General transfer comment: Cues for hand placement. Pt able to take sidesteps along EOB however required Min A for safety with constant cues for proximity to RW.    Ambulation/Gait                  Stairs            Wheelchair Mobility     Tilt Bed    Modified Rankin (Stroke Patients Only)       Balance Overall balance assessment: Needs assistance Sitting-balance support: Bilateral upper extremity supported, Feet supported Sitting balance-Leahy Scale: Good     Standing balance support: Bilateral upper extremity supported, During functional activity, Reliant on assistive device for balance Standing balance-Leahy Scale: Poor Standing balance comment: Reliant on RW                             Pertinent Vitals/Pain Pain Assessment Pain Assessment: No/denies pain    Home Living Family/patient expects to be discharged to:: Private residence Living Arrangements: Alone (Pt reports that he has a lady staying with him now that is going to help him out) Available Help at Discharge: Friend(s);Available 24 hours/day Type of Home: House Home Access: Stairs to enter Entrance Stairs-Rails: None Entrance Stairs-Number of Steps: 3   Home Layout: One level Home Equipment: Agricultural consultant (2 wheels)      Prior Function Prior Level of Function : Independent/Modified Independent  Mobility Comments: Ind no AD ADLs Comments: Ind     Extremity/Trunk Assessment   Upper Extremity Assessment Upper Extremity Assessment: Overall WFL for tasks assessed    Lower Extremity Assessment Lower Extremity Assessment: RLE deficits/detail RLE Deficits / Details: R 3rd toe infection.    Cervical / Trunk Assessment Cervical / Trunk Assessment: Normal  Communication    Communication Communication: Other (comment) (Hyperverbal. Requires frequent redirection.)  Cognition Arousal: Alert                                              General Comments General comments (skin integrity, edema, etc.): VSS    Exercises     Assessment/Plan    PT Assessment Patient needs continued PT services  PT Problem List Decreased strength;Decreased activity tolerance;Decreased range of motion;Decreased balance;Decreased mobility;Decreased coordination;Decreased knowledge of use of DME;Decreased knowledge of precautions;Decreased safety awareness;Cardiopulmonary status limiting activity;Pain       PT Treatment Interventions DME instruction;Gait training;Stair training;Therapeutic activities;Functional mobility training;Balance training;Therapeutic exercise;Neuromuscular re-education;Cognitive remediation;Patient/family education    PT Goals (Current goals can be found in the Care Plan section)  Acute Rehab PT Goals Patient Stated Goal: to get better PT Goal Formulation: With patient Time For Goal Achievement: 07/07/23 Potential to Achieve Goals: Good    Frequency Min 1X/week     Co-evaluation               AM-PAC PT "6 Clicks" Mobility  Outcome Measure Help needed turning from your back to your side while in a flat bed without using bedrails?: A Little Help needed moving from lying on your back to sitting on the side of a flat bed without using bedrails?: A Little Help needed moving to and from a bed to a chair (including a wheelchair)?: A Lot Help needed standing up from a chair using your arms (e.g., wheelchair or bedside chair)?: A Little Help needed to walk in hospital room?: A Lot Help needed climbing 3-5 steps with a railing? : Total 6 Click Score: 14    End of Session Equipment Utilized During Treatment: Gait belt Activity Tolerance: Patient tolerated treatment well Patient left: in bed;with call bell/phone within reach Nurse  Communication: Mobility status PT Visit Diagnosis: Other abnormalities of gait and mobility (R26.89)    Time: 4259-5638 PT Time Calculation (min) (ACUTE ONLY): 26 min   Charges:   PT Evaluation $PT Eval Moderate Complexity: 1 Mod PT Treatments $Gait Training: 8-22 mins PT General Charges $$ ACUTE PT VISIT: 1 Visit         Shela Nevin, PT, DPT Acute Rehab Services [phone]   [name] 06/23/2023, 1:44 PM

## 2023-06-23 NOTE — Care Management Important Message (Signed)
Important Message  Patient Details  Name: Jeffrey Murphy MRN: [id number] Date of Birth: Apr 04, 1956   Medicare Important Message Given:  Yes     Sherilyn Banker 06/23/2023, 2:32 PM

## 2023-06-23 NOTE — Progress Notes (Signed)
[facility] PROGRESS NOTE  Jeffrey Murphy (DOB: November 25, 1955) [id number] PCP: [name], NP  Brief Narrative: HPI: Jeffrey Murphy is a 67 y.o. male with medical history significant of  DMII, Hypertension , who has interim history of Right submetatarsal 2 friction blister with underlying ulceration with fat layer exposed s/p  drainage of blister on 8/7 s/p which patient was given doxycycline for skin and soft tissue infection prophylaxis.  Patient notes around 48 hours ago he noted increase redness swelling,drain and odor from area. He presents to ED due to persistent swelling with concern for infected ulcer. Patient notes no fever/n/v/d but states he had mild chills. He denies any sob/cough / chest pain or other complaints   ED Course: Vitals:afeb, bp121/87, hr 109 rr 16 sat 975  Labs Lactic 2.2  ESR 53, crp 23.8 Na 135, K 4, Cl 96, bicarb 27, cr 0.93 Wbc 15.6, hgb 14.6,  plt 192 RLE xray:IMPRESSION: 1. No acute bone abnormality to the right foot. 2. Hallux valgus deformity.  Subjective: Pain better terrible last night, better now, overall actually better, no new issues. Understands recommendation for TMA. Amenable. A bit tearful over his wife dying last year.  Objective: BP 115/76 (BP Location: Left Arm)   Pulse 82   Temp 97.8 F (36.6 C) (Oral)   Resp 14   Ht 5\' 9"  (1.753 m)   Wt 91.5 kg   SpO2 95%   BMI 29.79 kg/m   Gen: No distress, chronically ill-appearing Pulm: Clear, nonlabored  CV: RRR GI: Soft, NT, ND, +BS  Neuro: Alert and oriented. No new focal deficits. Ext: R foot with ACE wrap, toes have sensation and are warm. Edematous up the ankle, lower leg.   Assessment & Plan: Right 3rd metatarsal acute osteomyelitis with associated septic joint and plantar abscess:  - Continue broad IV antibiotics, broadened to include antipseudomonal cephalosporin pending surgical plan/findings. Will follow for speciation of wound cultures. Appreciate podiatry  management/assistance. TMA recommended to pt who is amenable.  - Monitor blood cultures.  - Pain control including gabapentin, oxycodone, increase to 5-10mg  po q4h prn mod-severe pain.   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 - Continued on home insulin and SSI with erratic control. Will convert 30u BID of 70/30 ( which = 42u long / 18u short TDD) into 40u glargine qHS, 5u TIDWC + mod SSI and HS coverage.    HLD:  - Continue statin  Hx polysubstance abuse:  - UDS 8/13 positive for cocaine, otherwise negative. Cessation counseling provided.   [name], MD [facility] [email or web address] 06/23/2023, 1:19 PM

## 2023-06-24 DIAGNOSIS — L089 Local infection of the skin and subcutaneous tissue, unspecified: Secondary | ICD-10-CM | POA: Diagnosis not present

## 2023-06-24 DIAGNOSIS — E11628 Type 2 diabetes mellitus with other skin complications: Secondary | ICD-10-CM | POA: Diagnosis not present

## 2023-06-24 LAB — GLUCOSE, CAPILLARY
Glucose-Capillary: 138 mg/dL — ABNORMAL HIGH (ref 70–99)
Glucose-Capillary: 153 mg/dL — ABNORMAL HIGH (ref 70–99)
Glucose-Capillary: 204 mg/dL — ABNORMAL HIGH (ref 70–99)
Glucose-Capillary: 195 mg/dL — ABNORMAL HIGH (ref 70–99)

## 2023-06-24 NOTE — Progress Notes (Signed)
[facility] PROGRESS NOTE  Jeffrey Murphy (DOB: Oct 28, 1956) [id number] PCP: Jeffrey Aldo, NP  Brief Narrative: HPI: Jeffrey Murphy is a 67 y.o. male with medical history significant of  DMII, Hypertension , who has interim history of Right submetatarsal 2 friction blister with underlying ulceration with fat layer exposed s/p  drainage of blister on 8/7 s/p which patient was given doxycycline for skin and soft tissue infection prophylaxis.  Patient notes around 48 hours ago he noted increase redness swelling,drain and odor from area. He presents to ED due to persistent swelling with concern for infected ulcer. Patient notes no fever/n/v/d but states he had mild chills. He denies any sob/cough / chest pain or other complaints   ED Course: Vitals:afeb, bp121/87, hr 109 rr 16 sat 975  Labs Lactic 2.2  ESR 53, crp 23.8 Na 135, K 4, Cl 96, bicarb 27, cr 0.93 Wbc 15.6, hgb 14.6,  plt 192 RLE xray:IMPRESSION: 1. No acute bone abnormality to the right foot. 2. Hallux valgus deformity.  Subjective: No new issues, amenable to TMA. Pain controlled.   Objective: BP 100/69 (BP Location: Right Arm)   Pulse 81   Temp 97.8 F (36.6 C)   Resp 18   Ht 5\' 9"  (1.753 m)   Wt 91.5 kg   SpO2 92%   BMI 29.79 kg/m   Gen: No distress, resting quietly Pulm: Nonlabored, clear CV: RRR Neuro: A/O, no focal deficits Ext: Right foot in ACE wrap, toes warm. Edema improving.    Assessment & Plan: Right 3rd metatarsal acute osteomyelitis with associated septic joint and plantar abscess:  - Continue broad IV antibiotics, broadened to include antipseudomonal cephalosporin pending surgical plan/findings. Will follow for speciation of wound cultures (still pending). Appreciate podiatry management/assistance. D/w Dr. Logan Bores. Planning TMA 8/18.  - Monitor blood cultures.  - Pain control including gabapentin, oxycodone 5-10mg  po q4h prn mod-severe pain.   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 - Continue 40u glargine qHS,  5u TIDWC + mod SSI and HS coverage. in place of home 70/30. At inpatient goal on this.   HLD:  - Continue statin  Hx polysubstance abuse:  - UDS 8/13 positive for cocaine, otherwise negative. Cessation counseling provided.   [name], MD [facility] [email or web address] 06/24/2023, 10:21 AM

## 2023-06-24 NOTE — Plan of Care (Signed)

## 2023-06-24 NOTE — Plan of Care (Signed)
  Problem: Education: Goal: Knowledge of General Education information will improve Description: Including pain rating scale, medication(s)/side effects and non-pharmacologic comfort measures Outcome: Progressing   Problem: Health Behavior/Discharge Planning: Goal: Ability to manage health-related needs will improve Outcome: Progressing   Problem: Pain Managment: Goal: General experience of comfort will improve Outcome: Progressing   Problem: Metabolic: Goal: Ability to maintain appropriate glucose levels will improve Outcome: Progressing

## 2023-06-24 NOTE — Progress Notes (Signed)
   Subjective: 3 Days Post-Op Procedure(s) (LRB): IRRIGATION AND DEBRIDEMENT FOOT, CULTURES, BONE CULTURE AND PATHOLOGY (Right) APPLICATION OF WOUND VAC (Right) DOS:   Objective: Vital signs in last 24 hours: Temp:  [97.4 F (36.3 C)-98.4 F (36.9 C)] 97.8 F (36.6 C) (08/17 0805) Pulse Rate:  [79-93] 81 (08/17 0805) Resp:  [14-20] 18 (08/17 0805) BP: (100-125)/(67-83) 100/69 (08/17 0805) SpO2:  [92 %-95 %] 92 % (08/17 0805)  Recent Labs    06/22/23 0026  HGB 13.4   Recent Labs    06/22/23 0026  WBC 9.4  RBC 4.72  HCT 40.7  PLT 177   Recent Labs    06/22/23 0026 06/23/23 0233  NA 134* 134*  K 3.6 3.6  CL 99 95*  CO2 27 29  BUN 10 10  CREATININE 0.72 0.78  GLUCOSE 272* 170*  CALCIUM 8.8* 9.1   No results for input(s): "LABPT", "INR" in the last 72 hours.     RT foot 06/24/2023  Physical Exam: Wound VAC is intact and functioning properly.  It was not removed today.  Clinically no concern for vascular compromise or limb ischemia  DG Foot Complete Right 06/20/2023 FINDINGS: Hallux valgus deformity. Negative for fracture or dislocation. Calcifications or ossifications along the plantar soft tissues. Calcaneal enthesopathic changes at the Achilles tendon insertion site. No evidence for bone destruction or osteomyelitis. IMPRESSION: 1. No acute bone abnormality to the right foot. 2. Hallux valgus deformity.  MR FOOT RIGHT W WO CONTRAST 06/20/2023 IMPRESSION: 1. Severe bone marrow edema in the third metatarsal head and base of the third proximal phalanx with a small third MCP joint effusion most concerning for septic arthritis and osteomyelitis. 2. Complex peripherally enhancing fluid collection along the plantar aspect of the foot emanating from the region of the third MTP joint along the plantar aspect measuring 4 x 1.8 x 3.3 cm concerning for an abscess deep to the flexor digitorum brevis muscle.  3. Mild marrow edema in the distal first metatarsal  diaphysis. Mild marrow edema in the medial and lateral cuneiform. Mild marrow edema in the cuboid. These findings are nonspecific and may reflect stress reaction versus less likely early osteomyelitis.  Assessment/Plan: 3 Days Post-Op Procedure(s) (LRB): IRRIGATION AND DEBRIDEMENT FOOT, CULTURES, BONE CULTURE AND PATHOLOGY (Right) APPLICATION OF WOUND VAC (Right) DOS: 06/21/2023  -Patient was seen at bedside this morning -Imaging was reviewed today -Amenable for transmetatarsal amputation of the right foot with tendo Achilles lengthening.  Risk benefits advantages disadvantages of this procedure were explained in detail to the patient.  No guarantees were expressed or implied.  Patient questions were answered. -Surgery planned for tomorrow a.m. @7 :30. NPO after midnight. Order placed. -Preoperative orders placed -Continue cefepime and flagyl as ordered -Will follow   [name] 06/24/2023, 10:36 AM  [name], DPM [facility]  Dr. [name], DPM    [address] [place], [place] [address]                Office [phone]  Fax [phone]

## 2023-06-24 NOTE — H&P (View-Only) (Signed)
   Subjective: 3 Days Post-Op Procedure(s) (LRB): IRRIGATION AND DEBRIDEMENT FOOT, CULTURES, BONE CULTURE AND PATHOLOGY (Right) APPLICATION OF WOUND VAC (Right) DOS:   Objective: Vital signs in last 24 hours: Temp:  [97.4 F (36.3 C)-98.4 F (36.9 C)] 97.8 F (36.6 C) (08/17 0805) Pulse Rate:  [79-93] 81 (08/17 0805) Resp:  [14-20] 18 (08/17 0805) BP: (100-125)/(67-83) 100/69 (08/17 0805) SpO2:  [92 %-95 %] 92 % (08/17 0805)  Recent Labs    06/22/23 0026  HGB 13.4   Recent Labs    06/22/23 0026  WBC 9.4  RBC 4.72  HCT 40.7  PLT 177   Recent Labs    06/22/23 0026 06/23/23 0233  NA 134* 134*  K 3.6 3.6  CL 99 95*  CO2 27 29  BUN 10 10  CREATININE 0.72 0.78  GLUCOSE 272* 170*  CALCIUM 8.8* 9.1   No results for input(s): "LABPT", "INR" in the last 72 hours.     RT foot 06/24/2023  Physical Exam: Wound VAC is intact and functioning properly.  It was not removed today.  Clinically no concern for vascular compromise or limb ischemia  DG Foot Complete Right 06/20/2023 FINDINGS: Hallux valgus deformity. Negative for fracture or dislocation. Calcifications or ossifications along the plantar soft tissues. Calcaneal enthesopathic changes at the Achilles tendon insertion site. No evidence for bone destruction or osteomyelitis. IMPRESSION: 1. No acute bone abnormality to the right foot. 2. Hallux valgus deformity.  MR FOOT RIGHT W WO CONTRAST 06/20/2023 IMPRESSION: 1. Severe bone marrow edema in the third metatarsal head and base of the third proximal phalanx with a small third MCP joint effusion most concerning for septic arthritis and osteomyelitis. 2. Complex peripherally enhancing fluid collection along the plantar aspect of the foot emanating from the region of the third MTP joint along the plantar aspect measuring 4 x 1.8 x 3.3 cm concerning for an abscess deep to the flexor digitorum brevis muscle.  3. Mild marrow edema in the distal first metatarsal  diaphysis. Mild marrow edema in the medial and lateral cuneiform. Mild marrow edema in the cuboid. These findings are nonspecific and may reflect stress reaction versus less likely early osteomyelitis.  Assessment/Plan: 3 Days Post-Op Procedure(s) (LRB): IRRIGATION AND DEBRIDEMENT FOOT, CULTURES, BONE CULTURE AND PATHOLOGY (Right) APPLICATION OF WOUND VAC (Right) DOS: 06/21/2023  -Patient was seen at bedside this morning -Imaging was reviewed today -Amenable for transmetatarsal amputation of the right foot with tendo Achilles lengthening.  Risk benefits advantages disadvantages of this procedure were explained in detail to the patient.  No guarantees were expressed or implied.  Patient questions were answered. -Surgery planned for tomorrow a.m. @7 :30. NPO after midnight. Order placed. -Preoperative orders placed -Continue cefepime and flagyl as ordered -Will follow   [name] 06/24/2023, 10:36 AM  [name], DPM [facility]  Dr. [name], DPM    [address] [place], [place] [address]                Office [phone]  Fax [phone]

## 2023-06-24 NOTE — Progress Notes (Signed)
Pharmacy Antibiotic Note  SHAWNTA DAHER is a 67 y.o. male admitted on 06/20/2023 with right foot infection. Per MD, he has right 3rd metatarsal acute osteomyelitis with a plantar abscess. He was on doxycycline PTA. Pharmacy has been consulted for vancomycin dosing and cefepime dosing. Flagyl per MD.   Scr 0.78 today, stable. Afebrile, No recent CBC. MRI results positive for fluid collection and concerning for deep abscesses and osteomyelitis.   Plan: Continue Vanc 1250 IV Q12H for AUC 492 using SCr 0.8 Continue cefepime 2g q8 hours  Monitor renal fxn, clinical progress, vanc levels if indicated Follow up culture results from pathology and follow up surgical plans  See culture results and timeline below   Height: 5\' 9"  (175.3 cm) Weight: 91.5 kg (201 lb 11.5 oz) IBW/kg (Calculated) : 70.7  Temp (24hrs), Avg:97.8 F (36.6 C), Min:97.4 F (36.3 C), Max:98.4 F (36.9 C)  Recent Labs  Lab 06/20/23 1330 06/20/23 1335 06/20/23 1703 06/21/23 0329 06/22/23 0026 06/23/23 0233  WBC 15.6*  --   --  13.3* 9.4  --   CREATININE 0.93  --   --  0.70 0.72 0.78  LATICACIDVEN  --  1.0 2.2*  --   --   --     Estimated Creatinine Clearance: 104.2 mL/min (by C-G formula based on SCr of 0.78 mg/dL).    Allergies  Allergen Reactions   Crestor [Rosuvastatin] Other (See Comments)    Myalgias    Lipitor [Atorvastatin] Other (See Comments)    Myalgias     Vanc 8/13 >>  Flagyl 8/13 >> Cefepime 8/14 >>  Culture Results to Date:  8/13 BCx - NG x 4 days  8/14 Wound Cx - GPCs, pending speciation, holding for anaerobes  8/14 Fluid Collection - GPCs and GNRs, pending speciation, holding for anaerobes   Blane Ohara, PharmD  PGY2 Pharmacy Resident

## 2023-06-25 ENCOUNTER — Encounter (HOSPITAL_COMMUNITY): Payer: Self-pay | Admitting: Internal Medicine

## 2023-06-25 ENCOUNTER — Inpatient Hospital Stay (HOSPITAL_COMMUNITY): Payer: Medicare PPO | Admitting: Certified Registered Nurse Anesthetist

## 2023-06-25 ENCOUNTER — Inpatient Hospital Stay (HOSPITAL_COMMUNITY): Payer: Medicare PPO

## 2023-06-25 ENCOUNTER — Other Ambulatory Visit: Payer: Self-pay

## 2023-06-25 ENCOUNTER — Encounter (HOSPITAL_COMMUNITY)
Admission: EM | Disposition: A | Discharge status: Skilled Nursing Facility | Payer: Self-pay | Source: Ambulatory Visit | Attending: Family Medicine

## 2023-06-25 DIAGNOSIS — M86171 Other acute osteomyelitis, right ankle and foot: Secondary | ICD-10-CM

## 2023-06-25 DIAGNOSIS — I10 Essential (primary) hypertension: Secondary | ICD-10-CM | POA: Diagnosis not present

## 2023-06-25 DIAGNOSIS — L089 Local infection of the skin and subcutaneous tissue, unspecified: Secondary | ICD-10-CM

## 2023-06-25 DIAGNOSIS — F419 Anxiety disorder, unspecified: Secondary | ICD-10-CM

## 2023-06-25 DIAGNOSIS — E11628 Type 2 diabetes mellitus with other skin complications: Secondary | ICD-10-CM | POA: Diagnosis not present

## 2023-06-25 DIAGNOSIS — E1169 Type 2 diabetes mellitus with other specified complication: Secondary | ICD-10-CM

## 2023-06-25 HISTORY — PX: TRANSMETATARSAL AMPUTATION: SHX6197

## 2023-06-25 LAB — GLUCOSE, CAPILLARY
Glucose-Capillary: 143 mg/dL — ABNORMAL HIGH (ref 70–99)
Glucose-Capillary: 139 mg/dL — ABNORMAL HIGH (ref 70–99)
Glucose-Capillary: 308 mg/dL — ABNORMAL HIGH (ref 70–99)
Glucose-Capillary: 281 mg/dL — ABNORMAL HIGH (ref 70–99)
Glucose-Capillary: 312 mg/dL — ABNORMAL HIGH (ref 70–99)

## 2023-06-25 SURGERY — AMPUTATION, FOOT, TRANSMETATARSAL
Anesthesia: General | Site: Foot | Laterality: Right

## 2023-06-25 MED ORDER — CHLORHEXIDINE GLUCONATE 0.12 % MT SOLN
15.0000 mL | Freq: Once | OROMUCOSAL | Status: AC
  Administered 2023-06-25: 15 mL via OROMUCOSAL

## 2023-06-25 MED ORDER — LACTATED RINGERS IV SOLN: INTRAVENOUS | Status: DC

## 2023-06-25 MED ORDER — OXYCODONE HCL 5 MG PO TABS: 5.0000 mg | ORAL_TABLET | Freq: Once | ORAL | Status: DC | PRN

## 2023-06-25 MED ORDER — ACETAMINOPHEN 10 MG/ML IV SOLN: 1000.0000 mg | Freq: Once | INTRAVENOUS | Status: DC | PRN

## 2023-06-25 MED ORDER — INSULIN ASPART 100 UNIT/ML IJ SOLN: 0.0000 [IU] | INTRAMUSCULAR | Status: DC | PRN

## 2023-06-25 MED ORDER — FENTANYL CITRATE (PF) 250 MCG/5ML IJ SOLN
INTRAMUSCULAR | Status: DC | PRN
  Administered 2023-06-25: 50 ug via INTRAVENOUS

## 2023-06-25 MED ORDER — LIDOCAINE HCL 2 % IJ SOLN
INTRAMUSCULAR | Status: AC
  Filled 2023-06-25: qty 20

## 2023-06-25 MED ORDER — PHENYLEPHRINE HCL-NACL 20-0.9 MG/250ML-% IV SOLN
INTRAVENOUS | Status: DC | PRN
  Administered 2023-06-25: 50 ug/min via INTRAVENOUS

## 2023-06-25 MED ORDER — 0.9 % SODIUM CHLORIDE (POUR BTL) OPTIME
TOPICAL | Status: DC | PRN
  Administered 2023-06-25: 1000 mL

## 2023-06-25 MED ORDER — EPHEDRINE SULFATE-NACL 50-0.9 MG/10ML-% IV SOSY
PREFILLED_SYRINGE | INTRAVENOUS | Status: DC | PRN
  Administered 2023-06-25 (×2): 5 mg via INTRAVENOUS

## 2023-06-25 MED ORDER — FENTANYL CITRATE (PF) 100 MCG/2ML IJ SOLN: 25.0000 ug | INTRAMUSCULAR | Status: DC | PRN

## 2023-06-25 MED ORDER — FENTANYL CITRATE (PF) 250 MCG/5ML IJ SOLN
INTRAMUSCULAR | Status: AC
  Filled 2023-06-25: qty 5

## 2023-06-25 MED ORDER — MIDAZOLAM HCL 2 MG/2ML IJ SOLN
INTRAMUSCULAR | Status: DC | PRN
  Administered 2023-06-25 (×2): 1 mg via INTRAVENOUS

## 2023-06-25 MED ORDER — BUPIVACAINE HCL (PF) 0.5 % IJ SOLN
INTRAMUSCULAR | Status: AC
  Filled 2023-06-25: qty 30

## 2023-06-25 MED ORDER — ONDANSETRON HCL 4 MG/2ML IJ SOLN
INTRAMUSCULAR | Status: DC | PRN
  Administered 2023-06-25: 4 mg via INTRAVENOUS

## 2023-06-25 MED ORDER — PHENYLEPHRINE 80 MCG/ML (10ML) SYRINGE FOR IV PUSH (FOR BLOOD PRESSURE SUPPORT)
PREFILLED_SYRINGE | INTRAVENOUS | Status: DC | PRN
  Administered 2023-06-25: 80 ug via INTRAVENOUS
  Administered 2023-06-25: 160 ug via INTRAVENOUS

## 2023-06-25 MED ORDER — MIDAZOLAM HCL 2 MG/2ML IJ SOLN
INTRAMUSCULAR | Status: AC
  Filled 2023-06-25: qty 2

## 2023-06-25 MED ORDER — ONDANSETRON HCL 4 MG/2ML IJ SOLN: 4.0000 mg | Freq: Once | INTRAMUSCULAR | Status: DC | PRN

## 2023-06-25 MED ORDER — OXYCODONE HCL 5 MG/5ML PO SOLN: 5.0000 mg | Freq: Once | ORAL | Status: DC | PRN

## 2023-06-25 MED ORDER — ORAL CARE MOUTH RINSE: 15.0000 mL | Freq: Once | OROMUCOSAL | Status: AC

## 2023-06-25 MED ORDER — PROPOFOL 10 MG/ML IV BOLUS
INTRAVENOUS | Status: DC | PRN
Start: 2023-06-25 — End: 2023-06-25
  Administered 2023-06-25: 125 mg via INTRAVENOUS

## 2023-06-25 SURGICAL SUPPLY — 56 items
APL PRP STRL LF DISP 70% ISPRP (MISCELLANEOUS)
APL SKNCLS STERI-STRIP NONHPOA (GAUZE/BANDAGES/DRESSINGS) ×1
BAG COUNTER SPONGE SURGICOUNT (BAG) ×2 IMPLANT
BAG SPNG CNTER NS LX DISP (BAG) ×1
BENZOIN TINCTURE PRP APPL 2/3 (GAUZE/BANDAGES/DRESSINGS) ×2 IMPLANT
BLADE AVERAGE 25X9 (BLADE) IMPLANT
BLADE OSCILLATING /SAGITTAL (BLADE) ×2 IMPLANT
BLADE SURG 11 STRL SS (BLADE) IMPLANT
BLADE SURG 15 STRL LF DISP TIS (BLADE) IMPLANT
BLADE SURG 15 STRL SS (BLADE)
BNDG CMPR 5X4 KNIT ELC UNQ LF (GAUZE/BANDAGES/DRESSINGS) ×1
BNDG ELASTIC 4INX 5YD STR LF (GAUZE/BANDAGES/DRESSINGS) IMPLANT
BNDG ELASTIC 4X5.8 VLCR STR LF (GAUZE/BANDAGES/DRESSINGS) IMPLANT
BNDG GAUZE DERMACEA FLUFF 4 (GAUZE/BANDAGES/DRESSINGS) ×2 IMPLANT
BNDG GZE DERMACEA 4 6PLY (GAUZE/BANDAGES/DRESSINGS) ×1
CHLORAPREP W/TINT 26 (MISCELLANEOUS) IMPLANT
COVER SURGICAL LIGHT HANDLE (MISCELLANEOUS) ×2 IMPLANT
CUFF TOURN SGL QUICK 18X4 (TOURNIQUET CUFF) ×2 IMPLANT
CUFF TOURN SGL QUICK 24 (TOURNIQUET CUFF)
CUFF TRNQT CYL 24X4X16.5-23 (TOURNIQUET CUFF) IMPLANT
DRAPE OEC MINIVIEW 54X84 (DRAPES) IMPLANT
DRSG ADAPTIC 3X8 NADH LF (GAUZE/BANDAGES/DRESSINGS) IMPLANT
ELECT REM PT RETURN 9FT ADLT (ELECTROSURGICAL) ×1
ELECTRODE REM PT RTRN 9FT ADLT (ELECTROSURGICAL) IMPLANT
GAUZE PACKING IODOFORM 1/4X15 (PACKING) ×2 IMPLANT
GAUZE PAD ABD 8X10 STRL (GAUZE/BANDAGES/DRESSINGS) ×2 IMPLANT
GAUZE SPONGE 4X4 12PLY STRL (GAUZE/BANDAGES/DRESSINGS) ×2 IMPLANT
GAUZE XEROFORM 1X8 LF (GAUZE/BANDAGES/DRESSINGS) ×2 IMPLANT
GAUZE XEROFORM 5X9 LF (GAUZE/BANDAGES/DRESSINGS) IMPLANT
GLOVE BIO SURGEON STRL SZ8 (GLOVE) ×2 IMPLANT
GLOVE BIOGEL PI IND STRL 6.5 (GLOVE) IMPLANT
GLOVE BIOGEL PI IND STRL 7.5 (GLOVE) IMPLANT
GLOVE BIOGEL PI IND STRL 8 (GLOVE) ×2 IMPLANT
GOWN STRL REUS W/ TWL LRG LVL3 (GOWN DISPOSABLE) ×4 IMPLANT
GOWN STRL REUS W/TWL LRG LVL3 (GOWN DISPOSABLE) ×3
KIT BASIN OR (CUSTOM PROCEDURE TRAY) ×2 IMPLANT
KIT TURNOVER KIT B (KITS) ×2 IMPLANT
NDL PRECISIONGLIDE 27X1.5 (NEEDLE) ×2 IMPLANT
NEEDLE PRECISIONGLIDE 27X1.5 (NEEDLE) ×1 IMPLANT
NS IRRIG 1000ML POUR BTL (IV SOLUTION) ×2 IMPLANT
PACK ORTHO EXTREMITY (CUSTOM PROCEDURE TRAY) ×2 IMPLANT
PAD ARMBOARD 7.5X6 YLW CONV (MISCELLANEOUS) ×4 IMPLANT
PAD CAST 4YDX4 CTTN HI CHSV (CAST SUPPLIES) IMPLANT
PADDING CAST COTTON 4X4 STRL (CAST SUPPLIES)
SOL PREP POV-IOD 4OZ 10% (MISCELLANEOUS) ×2 IMPLANT
STAPLER VISISTAT 35W (STAPLE) IMPLANT
STRIP CLOSURE SKIN 1/2X4 (GAUZE/BANDAGES/DRESSINGS) ×2 IMPLANT
SUT ETHILON 3 0 FSL (SUTURE) IMPLANT
SUT PROLENE 4 0 PS 2 18 (SUTURE) IMPLANT
SUT VIC AB 3-0 PS2 18 (SUTURE)
SUT VIC AB 3-0 PS2 18XBRD (SUTURE) IMPLANT
SUT VICRYL 4-0 PS2 18IN ABS (SUTURE) ×2 IMPLANT
SYR CONTROL 10ML LL (SYRINGE) ×4 IMPLANT
TOWEL GREEN STERILE (TOWEL DISPOSABLE) ×2 IMPLANT
TUBE CONNECTING 12X1/4 (SUCTIONS) ×2 IMPLANT
YANKAUER SUCT BULB TIP NO VENT (SUCTIONS) IMPLANT

## 2023-06-25 NOTE — Anesthesia Preprocedure Evaluation (Addendum)
Anesthesia Evaluation  Patient identified by MRN, date of birth, ID band Patient awake    Reviewed: Allergy & Precautions, NPO status , Patient's Chart, lab work & pertinent test results, reviewed documented beta blocker date and time   History of Anesthesia Complications Negative for: history of anesthetic complications  Airway Mallampati: II  TM Distance: >3 FB Neck ROM: Full    Dental no notable dental hx.    Pulmonary neg pulmonary ROS   breath sounds clear to auscultation       Cardiovascular hypertension, (-) angina (-) CAD, (-) Past MI and (-) Cardiac Stents  Rhythm:Regular Rate:Normal     Neuro/Psych   Anxiety        GI/Hepatic ,GERD  ,,(+)     substance abuse  cocaine use  Endo/Other  diabetes, Poorly Controlled    Renal/GU      Musculoskeletal  (+) Arthritis ,    Abdominal   Peds  Hematology   Anesthesia Other Findings   Reproductive/Obstetrics                              Anesthesia Physical Anesthesia Plan  ASA: 3  Anesthesia Plan: General   Post-op Pain Management:    Induction: Intravenous  PONV Risk Score and Plan:   Airway Management Planned: LMA  Additional Equipment:   Intra-op Plan:   Post-operative Plan: Extubation in OR  Informed Consent:      Dental advisory given  Plan Discussed with: CRNA  Anesthesia Plan Comments:          Anesthesia Quick Evaluation

## 2023-06-25 NOTE — Anesthesia Postprocedure Evaluation (Signed)
Anesthesia Post Note  Patient: Jeffrey Murphy  Procedure(s) Performed: TRANSMETATARSAL AMPUTATION (Right: Foot)     Patient location during evaluation: PACU Anesthesia Type: General Level of consciousness: awake and alert Pain management: pain level controlled Vital Signs Assessment: post-procedure vital signs reviewed and stable Respiratory status: spontaneous breathing, nonlabored ventilation, respiratory function stable and patient connected to nasal cannula oxygen Cardiovascular status: blood pressure returned to baseline and stable Postop Assessment: no apparent nausea or vomiting Anesthetic complications: no   No notable events documented.  Last Vitals:  Vitals:   06/25/23 0945 06/25/23 1000  BP: 99/66 96/66  Pulse: 81 83  Resp: 11 14  Temp:    SpO2: 96% 95%    Last Pain:  Vitals:   06/25/23 1000  TempSrc:   PainSc: 0-No pain                 Mariann Barter

## 2023-06-25 NOTE — Plan of Care (Signed)

## 2023-06-25 NOTE — Transfer of Care (Signed)
Immediate Anesthesia Transfer of Care Note  Patient: Jeffrey Murphy  Procedure(s) Performed: TRANSMETATARSAL AMPUTATION (Right: Foot)  Patient Location: PACU  Anesthesia Type:General  Level of Consciousness: awake and drowsy  Airway & Oxygen Therapy: Patient Spontanous Breathing and Patient connected to nasal cannula oxygen  Post-op Assessment: Report given to RN and Post -op Vital signs reviewed and stable  Post vital signs: Reviewed and stable  Last Vitals:  Vitals Value Taken Time  BP 105/70 06/25/23 0936  Temp    Pulse 83 06/25/23 0939  Resp 18 06/25/23 0939  SpO2 94 % 06/25/23 0939  Vitals shown include unfiled device data.  Last Pain:  Vitals:   06/25/23 0435  TempSrc: Oral  PainSc:       Patients Stated Pain Goal: 3 (06/24/23 6962)  Complications: No notable events documented.

## 2023-06-25 NOTE — Anesthesia Procedure Notes (Signed)
Anesthesia Regional Block: Adductor canal block   Pre-Anesthetic Checklist: , timeout performed,  Correct Patient, Correct Site, Correct Laterality,  Correct Procedure, Correct Position, site marked,  Risks and benefits discussed,  Surgical consent,  Pre-op evaluation,  At surgeon's request and post-op pain management  Laterality: Right  Prep: Maximum Sterile Barrier Precautions used, chloraprep       Needles:  Injection technique: Single-shot  Needle Type: Echogenic Needle      Needle Gauge: 20     Additional Needles:   Procedures:,,,, ultrasound used (permanent image in chart),,    Narrative:  Start time: 06/25/2023 7:40 AM End time: 06/25/2023 7:42 AM Injection made incrementally with aspirations every 5 mL.  Performed by: Personally  Anesthesiologist: Mariann Barter, MD

## 2023-06-25 NOTE — Brief Op Note (Signed)
06/20/2023 - 06/25/2023  9:25 AM  PATIENT:  Jeffrey Murphy  67 y.o. male  PRE-OPERATIVE DIAGNOSIS:  RIGHT INFECTED FOOT  POST-OPERATIVE DIAGNOSIS:  Right infected foot/osteomyelitis  PROCEDURE:  Procedure(s): TRANSMETATARSAL AMPUTATION (Right)  SURGEON:  Surgeons and Role:    Jeffrey Murphy, DPM - Primary  PHYSICIAN ASSISTANT: None  ASSISTANTS: none   ANESTHESIA:   regional and IV sedation  EBL:  10mL   BLOOD ADMINISTERED:none  DRAINS: none   LOCAL MEDICATIONS USED:  NONE  SPECIMEN:  No Specimen  DISPOSITION OF SPECIMEN:  N/A  COUNTS:  YES  TOURNIQUET:   Total Tourniquet Time Documented: Calf (Right) - 31 minutes Calf (Right) - 15 minutes Total: Calf (Right) - 46 minutes   DICTATION: .Jeffrey Murphy Dictation  PLAN OF CARE: Admit to inpatient   PATIENT DISPOSITION:  PACU - hemodynamically stable.   Delay start of Pharmacological VTE agent (>24hrs) due to surgical blood loss or risk of bleeding: no

## 2023-06-25 NOTE — Interval H&P Note (Signed)
History and Physical Interval Note:  06/25/2023 7:43 AM  [name]  has presented today for surgery, with the diagnosis of RIGHT INFECTED FOOT.  The various methods of treatment have been discussed with the patient and family. After consideration of risks, benefits and other options for treatment, the patient has consented to  Procedure(s): TRANSMETATARSAL AMPUTATION (Right) as a surgical intervention.  The patient's history has been reviewed, patient examined, no change in status, stable for surgery.  I have reviewed the patient's chart and labs.  Questions were answered to the patient's satisfaction.     [name]

## 2023-06-25 NOTE — Progress Notes (Signed)
[facility] PROGRESS NOTE  Jeffrey Murphy (DOB: 08/22/1956) ZOX:[id number] PCP: [name], NP  Brief Narrative: HPI: Jeffrey Murphy is a 67 y.o. male with medical history significant of  DMII, Hypertension , who has interim history of Right submetatarsal 2 friction blister with underlying ulceration with fat layer exposed s/p  drainage of blister on 8/7 s/p which patient was given doxycycline for skin and soft tissue infection prophylaxis.  Patient notes around 48 hours ago he noted increase redness swelling,drain and odor from area. He presents to ED due to persistent swelling with concern for infected ulcer. Patient notes no fever/n/v/d but states he had mild chills. He denies any sob/cough / chest pain or other complaints   ED Course: Vitals:afeb, bp121/87, hr 109 rr 16 sat 975  Labs Lactic 2.2  ESR 53, crp 23.8 Na 135, K 4, Cl 96, bicarb 27, cr 0.93 Wbc 15.6, hgb 14.6,  plt 192 RLE xray:IMPRESSION: 1. No acute bone abnormality to the right foot. 2. Hallux valgus deformity.  Subjective: Seen shortly after surgery, no pain/nerve blocked. No dyspnea or chest pain, he'd like to start eating today.   Objective: BP 108/72 (BP Location: Left Arm)   Pulse 87   Temp 97.6 F (36.4 C) (Oral)   Resp 16   Ht 5' 9.02" (1.753 m)   Wt 87.6 kg   SpO2 97%   BMI 28.51 kg/m   No distress, feeling well, WDWN Clear, nonlabored RRR, no MRG or pitting edema R foot ACE wrapped without proximal erythema and no discharge. Numb s/p block.   Assessment & Plan: Right 3rd metatarsal acute osteomyelitis with associated septic joint and plantar pyomyositis:  - Wound/bone culture w/B. fragilis. Continuing current abx including flagyl pending further organism ID.   - Now s/p right foot Lisfranc amputation and tendo achilles lengthening.  - Blood cultures NG5D. - Pain control including gabapentin, oxycodone 5-10mg  po q4h prn mod-severe pain.   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 - Continue 40u glargine qHS, 5u  TIDWC + mod SSI and HS coverage. in place of home 70/30. At inpatient goal currently   HLD:  - Continue statin  Hx polysubstance abuse:  - UDS 8/13 positive for cocaine, otherwise negative. Cessation counseling provided.   [name], MD [facility] [email or web address] 06/25/2023, 10:59 AM

## 2023-06-25 NOTE — Anesthesia Procedure Notes (Signed)
Procedure Name: LMA Insertion Date/Time: 06/25/2023 8:18 AM  Performed by: Lelon Perla, CRNAPre-anesthesia Checklist: Patient identified, Emergency Drugs available, Suction available and Patient being monitored Patient Re-evaluated:Patient Re-evaluated prior to induction Oxygen Delivery Method: Circle System Utilized Preoxygenation: Pre-oxygenation with 100% oxygen Induction Type: IV induction Ventilation: Mask ventilation without difficulty LMA: LMA inserted LMA Size: 4.0 Number of attempts: 1 Airway Equipment and Method: Bite block Placement Confirmation: positive ETCO2 Tube secured with: Tape Dental Injury: Teeth and Oropharynx as per pre-operative assessment

## 2023-06-25 NOTE — Anesthesia Procedure Notes (Signed)
Anesthesia Regional Block: Popliteal block   Pre-Anesthetic Checklist: , timeout performed,  Correct Patient, Correct Site, Correct Laterality,  Correct Procedure, Correct Position, site marked,  Risks and benefits discussed,  Surgical consent,  Pre-op evaluation,  At surgeon's request and post-op pain management  Laterality: Right  Prep: chloraprep       Needles:  Injection technique: Single-shot  Needle Type: Echogenic Needle     Needle Length: 5cm  Needle Gauge: 20     Additional Needles:   Procedures:,,,, ultrasound used (permanent image in chart),,    Narrative:  Start time: 06/25/2023 7:50 AM End time: 06/25/2023 7:55 AM Injection made incrementally with aspirations every 5 mL.  Performed by: Personally

## 2023-06-25 NOTE — Op Note (Signed)
OPERATIVE REPORT Patient name: Jeffrey Murphy MRN: [id number] DOB: 20-Mar-1956  DOS: 06/25/23  Preop Dx: Osteomyelitis/pyomyositis RT foot Postop Dx: same  Procedure:  1.  Tendo Achilles lengthening right 2.  Lisfranc amputation right foot  Surgeon: [name] DPM  Anesthesia: Preoperative popliteal block in combination with IV sedation  Hemostasis: Calf tourniquet inflated to a pressure of without esmarch exsanguination   EBL: 10 mL Materials: None Injectables: None Pathology: Right forefoot sent for discard  Condition: The patient tolerated the procedure and anesthesia well. No complications noted or reported   Justification for procedure: The patient is a 67 y.o. male who presents today for surgical correction of osteomyelitis of the right forefoot in combination with underlying abscess/pyomyositis. All conservative modalities of been unsuccessful in providing any sort of satisfactory alleviation of symptoms with the patient. The patient was told benefits as well as possible side effects of the surgery. The patient consented for surgical correction. The patient consent form was reviewed. All patient questions were answered. No guarantees were expressed or implied. The patient and the surgeon both signed the patient consent form with the witness present and placed in the patient's chart.   Procedure in Detail: The patient was brought to the operating room, placed in the operating table in the supine position at which time an aseptic scrub and drape were performed about the patient's respective lower extremity after anesthesia was induced as described above. Attention was then directed to the surgical area where procedure number one commenced.  Procedure #1: Tendo Achilles lengthening right 3 small percutaneous incisions were planned and made at the posterior aspect of the ankle bisecting the Achilles tendon spaced approximately 1 cm apart.  The most distal proximal  incision began bisecting the Achilles tendon and extending medially to release the medial fibers while the central incision was extended laterally to release the lateral fibers in essence creating an accordion type lengthening of the Achilles.  Primary closure was achieved using 4-0 Prolene suture  Procedure #2: Lisfranc amputation right foot A fishmouth type incision was planned and made around the patient's midfoot using a #10 scalpel.  Incision was carried directly down to the level of bone around the the midportion of the metatarsals.  The plantar soft tissue flap demonstrated significant necrosis of the underlying tissue.  All of the necrotic soft tissue to the plantar flap was excisionally removed and debrided away to cleaner margins.  Soft tissue was reflected from the central portion of the metatarsals and dorsal skin flap to allow exposure to the metatarsals in preparation for the ensuing osteotomies.  An osteotomy was created around the midportion of the metatarsals.  The distal portion of the foot was disarticulated and removed in toto.  Unfortunately, due to the extensive necrotic soft tissue that had to be debrided from the plantar flap there was not enough soft tissue for primary closure.  The decision was made to extend the amputation more proximal to the Lisfranc joint.  At this time the remaining portions of the metatarsals 1-5 were disarticulated at the TMT.  A portion of the medial cuneiform was also resected away using a sagittal blade on a sagittal saw to allow for primary closure of the soft tissue skin flaps.  This was verified by intraoperative x-ray fluoroscopy.  Copious irrigation was then utilized in preparation for primary closure.  Any exposed tendons were distracted distally and cut as far proximal as could be visualized.  The tourniquet was also deflated and any  open lumen vessels that were visualized were either cauterized or tied with 4-0 Vicryl suture.  Primary closure was  then achieved using combination of 2-0 nylon and stainless steel skin staples.  Reinforced with Steri-Strips.  Dry sterile compressive dressings were then applied to all previously mentioned incision sites about the patient's lower extremity.   The patient was then transferred from the operating room to the recovery room having tolerated the procedure and anesthesia well. All vital signs are stable. After a brief stay in the recovery room the patient was readmitted to inpatient room with postoperative orders placed.    IMPRESSION: Margins appeared clean.  Adequate perfusion.  I do not foresee the need for long-term IV antibiotics based on visual inspection intraoperatively.  [name], DPM [facility]  Dr. [name], DPM    [address] [place], [place] [address]                Office [phone]  Fax [phone]

## 2023-06-26 ENCOUNTER — Encounter (HOSPITAL_COMMUNITY): Payer: Self-pay | Admitting: Podiatry

## 2023-06-26 DIAGNOSIS — Z794 Long term (current) use of insulin: Secondary | ICD-10-CM

## 2023-06-26 DIAGNOSIS — E11628 Type 2 diabetes mellitus with other skin complications: Secondary | ICD-10-CM | POA: Diagnosis not present

## 2023-06-26 DIAGNOSIS — L089 Local infection of the skin and subcutaneous tissue, unspecified: Secondary | ICD-10-CM | POA: Diagnosis not present

## 2023-06-26 LAB — BASIC METABOLIC PANEL
Anion gap: 9 (ref 5–15)
BUN: 19 mg/dL (ref 8–23)
CO2: 27 mmol/L (ref 22–32)
Calcium: 8.9 mg/dL (ref 8.9–10.3)
Chloride: 97 mmol/L — ABNORMAL LOW (ref 98–111)
Creatinine, Ser: 0.8 mg/dL (ref 0.61–1.24)
GFR, Estimated: >60 mL/min (ref >60)
Glucose, Bld: 333 mg/dL — ABNORMAL HIGH (ref 70–99)
Potassium: 4.2 mmol/L (ref 3.5–5.1)
Sodium: 133 mmol/L — ABNORMAL LOW (ref 135–145)

## 2023-06-26 LAB — GLUCOSE, CAPILLARY
Glucose-Capillary: 293 mg/dL — ABNORMAL HIGH (ref 70–99)
Glucose-Capillary: 185 mg/dL — ABNORMAL HIGH (ref 70–99)
Glucose-Capillary: 211 mg/dL — ABNORMAL HIGH (ref 70–99)
Glucose-Capillary: 205 mg/dL — ABNORMAL HIGH (ref 70–99)

## 2023-06-26 LAB — CBC
HCT: 38.8 % — ABNORMAL LOW (ref 39.0–52.0)
Hemoglobin: 13.1 g/dL (ref 13.0–17.0)
MCH: 27.9 pg (ref 26.0–34.0)
MCHC: 33.8 g/dL (ref 30.0–36.0)
MCV: 82.6 fL (ref 80.0–100.0)
Platelets: 254 10*3/uL (ref 150–400)
RBC: 4.7 MIL/uL (ref 4.22–5.81)
RDW: 12.5 % (ref 11.5–15.5)
WBC: 13.6 10*3/uL — ABNORMAL HIGH (ref 4.0–10.5)
nRBC: 0 % (ref 0.0–0.2)

## 2023-06-26 MED ORDER — INSULIN ASPART 100 UNIT/ML IJ SOLN
8.0000 [IU] | Freq: Three times a day (TID) | INTRAMUSCULAR | Status: DC
  Administered 2023-06-26 – 2023-06-29 (×9): 8 [IU] via SUBCUTANEOUS

## 2023-06-26 MED ORDER — DOXYCYCLINE HYCLATE 100 MG PO TABS
100.0000 mg | ORAL_TABLET | Freq: Two times a day (BID) | ORAL | Status: DC
  Administered 2023-06-26 – 2023-06-29 (×6): 100 mg via ORAL
  Filled 2023-06-26 (×6): qty 1

## 2023-06-26 MED ORDER — VANCOMYCIN HCL 1500 MG/300ML IV SOLN
1500.0000 mg | Freq: Two times a day (BID) | INTRAVENOUS | Status: DC
  Administered 2023-06-26: 1500 mg via INTRAVENOUS
  Filled 2023-06-26: qty 300

## 2023-06-26 MED ORDER — SODIUM CHLORIDE 0.9 % IV SOLN: 3.0000 g | Freq: Four times a day (QID) | INTRAVENOUS | Status: DC

## 2023-06-26 MED ORDER — AMOXICILLIN-POT CLAVULANATE 875-125 MG PO TABS
1.0000 | ORAL_TABLET | Freq: Two times a day (BID) | ORAL | Status: DC
  Administered 2023-06-26 – 2023-06-29 (×7): 1 via ORAL
  Filled 2023-06-26 (×7): qty 1

## 2023-06-26 NOTE — Consult Note (Addendum)
[facility]    Date of Admission:  06/20/2023   Total days of inpatient antibiotics 6        Reason for Consult: Osteomyelitis right foot    Principal Problem:   Diabetic foot infection (HCC) Active Problems:   Pyogenic inflammation of bone (HCC)   Acute osteomyelitis of right foot [facility])   Assessment: 67 year old male admitted with osteomyelitis with underlying abscess of the right foot, he was previously seen by podiatry and has been on doxycycline since 8/7, #Right foot osteomyelitis with abscess that is post Lisfranc amputation - Patient followed by podiatry underwent drainage of friction caused blisters resulting in ulcers on 8/7, has been doxycycline ever since. - Right foot dorsum, MRI showed first and third toe osteo with underlying abscess at the third MTP joint and abscess deep to the flexor digitorum brevis muscle.  He had undergone debridement and bone biopsy on 8/14 with cultures growing GPR(for identification [facility]) Bacteroides jealous beta-lactamase positive.  Pathology showed acute osteomyelitis. - Taken back to the OR on 8/18 with podiatry for tendo Achilles lengthening and Lisfranc amputation right foot. - ID engaged for antibiotic recommendations.  Recommendations:  -Discontinue cefepime, metronidazole, vancomycin - Start doxycycline and Augmentin to complete 6-week antibiotics from the OR 8/14 EOT 08/01/2023.  Given infectious disease appointment with myself to do some labs, follow-up on wound on 9/12.  Follow-up ID for gram-positive rods sent  to [facility]. - Infectious disease will sign off  #Diabetes mellitus - A1c 8.2 - Needs glycemic control for optimal wound healing Microbiology:   Antibiotics: Cefepime 8/14-present Vancomycin 8/13-present Metronidazole 8/13-present Ceftriaxone 8/13  Cultures: Blood 8/17 no growth Blood culture 8/14 abundant gram-positive rod, moderate Bacteroides fragilis beta-lactamase positive.   Gram-positive rod sent for ID at [facility]   HPI: Jeffrey Murphy is a 67 y.o. male with history of posterior neck cellulitis status post I&D with cultures growing MSSA treated with hydroxyl x 2 weeks, diabetes mellitus, hypertension, right submetatarsal blister with underlying ulcer status post drainage of blister on 8/7 patient has been on doxycycline since that time presented with worsening wound.  On arrival to the ED vital stable.  X-ray showed no acute abnormality.  MRI showed severe bone marrow edema and third metatarsal head and base of the third proximal phalanx, septic arthritis, third MTP joint fluid collection concerning for abscess, marrow edema at first metatarsal diaphysis.  Taken to the OR with podiatry for I&D of right foot, bone biopsy.  Return to the OR on 8/18 for Lisfranc amputation right foot  Review of Systems: Review of Systems  All other systems reviewed and are negative.   Past Medical History:  Diagnosis Date   Abscess 12/2016   LEFT INGUINAL ABSCESS   Arthritis    Diabetes mellitus without complication (HCC)    Hypertension    Wears glasses    reading    Social History   Tobacco Use   Smoking status: Never   Smokeless tobacco: Never  Vaping Use   Vaping status: Never Used  Substance Use Topics   Alcohol use: Not Currently    Comment: drinks every other day   Drug use: Not Currently    Frequency: 5.0 times per week    Types: Marijuana, Cocaine, "Crack" cocaine    Comment: last use months    Family History  Problem Relation Age of Onset   Cancer Mother    Hypertension Mother    Diabetes Father  Scheduled Meds:  amoxicillin-clavulanate  1 tablet Oral Q12H   atorvastatin  40 mg Oral QHS   doxycycline  100 mg Oral Q12H   heparin  5,000 Units Subcutaneous Q8H   insulin aspart  0-15 Units Subcutaneous TID WC   insulin aspart  0-5 Units Subcutaneous QHS   insulin aspart  8 Units Subcutaneous TID WC   insulin glargine-yfgn  40 Units Subcutaneous  QHS   traZODone  50 mg Oral QHS   Continuous Infusions: PRN Meds:.albuterol, morphine injection, ondansetron **OR** ondansetron (ZOFRAN) IV, oxyCODONE Allergies  Allergen Reactions   Crestor [Rosuvastatin] Other (See Comments)    Myalgias    Lipitor [Atorvastatin] Other (See Comments)    Myalgias     OBJECTIVE: Blood pressure 103/70, pulse 70, temperature (!) 97.5 F (36.4 C), temperature source Oral, resp. rate 18, height 5' 9.02" (1.753 m), weight 86.4 kg, SpO2 94%.  Physical Exam Constitutional:      General: He is not in acute distress.    Appearance: He is normal weight. He is not toxic-appearing.  HENT:     Head: Normocephalic and atraumatic.     Right Ear: External ear normal.     Left Ear: External ear normal.     Nose: No congestion or rhinorrhea.     Mouth/Throat:     Mouth: Mucous membranes are moist.     Pharynx: Oropharynx is clear.  Eyes:     Extraocular Movements: Extraocular movements intact.     Conjunctiva/sclera: Conjunctivae normal.     Pupils: Pupils are equal, round, and reactive to light.  Cardiovascular:     Rate and Rhythm: Normal rate and regular rhythm.     Heart sounds: No murmur heard.    No friction rub. No gallop.  Pulmonary:     Effort: Pulmonary effort is normal.     Breath sounds: Normal breath sounds.  Abdominal:     General: Abdomen is flat. Bowel sounds are normal.     Palpations: Abdomen is soft.  Musculoskeletal:        General: No swelling.     Cervical back: Normal range of motion and neck supple.     Comments: Right foot bandaged  Skin:    General: Skin is warm and dry.  Neurological:     General: No focal deficit present.     Mental Status: He is oriented to person, place, and time.  Psychiatric:        Mood and Affect: Mood normal.     Lab Results Lab Results  Component Value Date   WBC 13.6 (H) 06/26/2023   HGB 13.1 06/26/2023   HCT 38.8 (L) 06/26/2023   MCV 82.6 06/26/2023   PLT 254 06/26/2023    Lab  Results  Component Value Date   CREATININE 0.80 06/26/2023   BUN 19 06/26/2023   NA 133 (L) 06/26/2023   K 4.2 06/26/2023   CL 97 (L) 06/26/2023   CO2 27 06/26/2023    Lab Results  Component Value Date   ALT 10 06/21/2023   AST 15 06/21/2023   ALKPHOS 72 06/21/2023   BILITOT 1.1 06/21/2023       Danelle Earthly, MD [facility] 06/26/2023, 1:06 PM   I have personally spent 84 minutes involved in face-to-face and non-face-to-face activities for this patient on the day of the visit. Professional time spent includes the following activities: Preparing to see the patient (review of tests), Obtaining and/or reviewing separately  obtained history (admission/discharge record), Performing a medically appropriate examination and/or evaluation , Ordering medications/tests/procedures, referring and communicating with other health care professionals, Documenting clinical information in the EMR, Independently interpreting results (not separately reported), Communicating results to the patient/family/caregiver, Counseling and educating the patient/family/caregiver and Care coordination (not separately reported).

## 2023-06-26 NOTE — [place] FL2 (Signed)
[place] MEDICAID FL2 LEVEL OF CARE FORM     IDENTIFICATION  Patient Name: Jeffrey Murphy Birthdate: 04-30-1956 Sex: male Admission Date (Current Location): 06/20/2023  [facility] and [place] Number:  Producer, television/film/video and Address:  [facility], [address] [place], [place] [address]      Provider Number: [id number]  Attending Physician Name and Address:  [name], MD  Relative Name and Phone Number:       Current Level of Care: Hospital Recommended Level of Care: Skilled Nursing Facility Prior Approval Number:    Date Approved/Denied:   PASRR Number: [id number]  Discharge Plan: SNF    Current Diagnoses: Patient Active Problem List   Diagnosis Date Noted   Acute osteomyelitis of right foot (HCC) 06/22/2023   Pyogenic inflammation of bone (HCC) 06/21/2023   Diabetic foot infection (HCC) 06/20/2023   Folliculitis    Abscess of neck 06/27/2022   Hyponatremia with increased serum osmolality 06/27/2022   Inguinal abscess 12/12/2016   Diabetes mellitus with complication (HCC) 12/12/2016   Sepsis (HCC) 12/12/2016   Drug abuse (HCC)    Hyperglycemia    GERD (gastroesophageal reflux disease) 04/19/2016   Hypertension 04/19/2016   Anxiety 04/19/2016   Rupture of right quadriceps tendon 12/24/2014   Quadriceps tendon rupture 12/24/2014    Orientation RESPIRATION BLADDER Height & Weight     Self, Time, Situation, Place  Normal Continent Weight: 190 lb 7.6 oz (86.4 kg) Height:  5' 9.02" (175.3 cm)  BEHAVIORAL SYMPTOMS/MOOD NEUROLOGICAL BOWEL NUTRITION STATUS      Continent Diet (See DC summary)  AMBULATORY STATUS COMMUNICATION OF NEEDS Skin   Limited Assist Verbally Surgical wounds (R foot incision)                       Personal Care Assistance Level of Assistance  Bathing, Feeding, Dressing Bathing Assistance: Limited assistance Feeding assistance: Independent Dressing Assistance: Limited assistance     Functional  Limitations Info  Sight, Hearing, Speech Sight Info: Adequate Hearing Info: Adequate Speech Info: Adequate    SPECIAL CARE FACTORS FREQUENCY  PT (By licensed PT), OT (By licensed OT)     PT Frequency: 5x week OT Frequency: 5x week            Contractures Contractures Info: Not present    Additional Factors Info  Code Status, Allergies, Insulin Sliding Scale Code Status Info: Full Allergies Info: Crestor, Lipitor   Insulin Sliding Scale Info: See DC summary       Current Medications (06/26/2023):  This is the current hospital active medication list Current Facility-Administered Medications  Medication Dose Route Frequency Provider Last Rate Last Admin   albuterol (PROVENTIL) (2.5 MG/3ML) 0.083% nebulizer solution 2.5 mg  2.5 mg Nebulization Q2H PRN Lurline Del, MD       amoxicillin-clavulanate (AUGMENTIN) 875-125 MG per tablet 1 tablet  1 tablet Oral Q12H Danelle Earthly, MD   1 tablet at 06/26/23 1040   atorvastatin (LIPITOR) tablet 40 mg  40 mg Oral QHS Skip Mayer A, MD   40 mg at 06/25/23 2158   doxycycline (VIBRA-TABS) tablet 100 mg  100 mg Oral Q12H Danelle Earthly, MD       heparin injection 5,000 Units  5,000 Units Subcutaneous Q8H Skip Mayer A, MD   5,000 Units at 06/26/23 1340   insulin aspart (novoLOG) injection 0-15 Units  0-15 Units Subcutaneous TID WC [name], MD   3 Units at  06/26/23 1217   insulin aspart (novoLOG) injection 0-5 Units  0-5 Units Subcutaneous QHS Hazeline Junker B, MD   4 Units at 06/25/23 2200   insulin aspart (novoLOG) injection 8 Units  8 Units Subcutaneous TID WC [name], MD   8 Units at 06/26/23 1217   insulin glargine-yfgn (SEMGLEE) injection 40 Units  40 Units Subcutaneous QHS [name], MD   40 Units at 06/25/23 2159   morphine (PF) 2 MG/ML injection 1 mg  1 mg Intravenous Q2H PRN Sundil, Subrina, MD   1 mg at 06/22/23 0538   ondansetron (ZOFRAN) tablet 4 mg  4 mg Oral Q6H PRN Lurline Del, MD        Or   ondansetron [facility]) injection 4 mg  4 mg Intravenous Q6H PRN Lurline Del, MD       oxyCODONE (Oxy IR/ROXICODONE) immediate release tablet 5-10 mg  5-10 mg Oral Q4H PRN [name], MD   10 mg at 06/26/23 0603   traZODone (DESYREL) tablet 50 mg  50 mg Oral QHS Sundil, Subrina, MD   50 mg at 06/25/23 2158     Discharge Medications: Please see discharge summary for a list of discharge medications.  Relevant Imaging Results:  Relevant Lab Results:   Additional Information SS# [id number], [place]

## 2023-06-26 NOTE — Progress Notes (Signed)
[facility] PROGRESS NOTE  Jeffrey Murphy (DOB: [birth year]) YQM:[id number] PCP: [name], NP  Brief Narrative: HPI: Jeffrey Murphy is a 67 y.o. male with medical history significant of  DMII, Hypertension , who has interim history of Right submetatarsal 2 friction blister with underlying ulceration with fat layer exposed s/p  drainage of blister on 8/7 s/p which patient was given doxycycline for skin and soft tissue infection prophylaxis.  Patient notes around 48 hours ago he noted increase redness swelling,drain and odor from area. He presents to ED due to persistent swelling with concern for infected ulcer. Patient notes no fever/n/v/d but states he had mild chills. He denies any sob/cough / chest pain or other complaints   ED Course: Vitals:afeb, bp121/87, hr 109 rr 16 sat 975  Labs Lactic 2.2  ESR 53, crp 23.8 Na 135, K 4, Cl 96, bicarb 27, cr 0.93 Wbc 15.6, hgb 14.6,  plt 192 RLE xray:IMPRESSION: 1. No acute bone abnormality to the right foot. 2. Hallux valgus deformity.  Subjective: Ate last night after surgery, drove blood sugars very high which is not typical for him. Pain controlled, no fever. Curious about exact area of amputation.  Objective: BP 103/70 (BP Location: Right Arm)   Pulse 70   Temp (!) 97.5 F (36.4 C) (Oral)   Resp 18   Ht 5' 9.02" (1.753 m)   Wt 86.4 kg   SpO2 94%   BMI 28.12 kg/m   Gen: No distress Pulm: Clear, nonlabored  CV: RRR, no MRG or pitting edema GI: Soft, NT, ND, +BS  Neuro: Alert and oriented. No new focal deficits. Ext: Warm, dry.  Skin: R foot with bulky ACE wrap dressing that is c/d/I without proximally spreading erythema. No other new rashes, lesions or ulcers on visualized skin   Assessment & Plan: Right 3rd metatarsal acute osteomyelitis with associated septic joint and plantar pyomyositis:  - Wound/bone culture at time of I&D 8/14 w/Dr. Lilian Kapur showing B. fragilis. Continuing current abx including flagyl, will reach out  to ID for narrowing recommendations. Blood cultures negative. - Now s/p right foot transmetatarsal amputation and tendo achilles lengthening 8/18 by Dr. Logan Bores. Postoperative wound care per podiatry.  - Pain control including gabapentin, oxycodone 5-10mg  po q4h prn mod-severe pain. - PT recommended SNF, OT consulted. TOC consulted.    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 - Continue 40u glargine qHS, increase novolog to 8u TIDWC + mod SSI and HS coverage in place of home 70/30. Stress response to surgery driving sugars up. Will continue monitoring.    HLD:  - Continue statin  Hx polysubstance abuse:  - UDS 8/13 positive for cocaine, otherwise negative. Cessation counseling provided.   Jeffrey Nine, MD [facility] [email or web address] 06/26/2023, 9:26 AM

## 2023-06-26 NOTE — Evaluation (Signed)
Occupational Therapy Evaluation Patient Details Name: Jeffrey Murphy MRN: [id number] DOB: [birth year] Today's Date: 06/26/2023   History of Present Illness Pt is a 67 year old male admitted on 06/20/23 due to persistent swelling with concern for infected ulcer. Past medical history significant of  DMII, Hypertension , who has interim history of Right submetatarsal 2 friction blister with underlying ulceration with fat layer exposed s/p  drainage of blister on 8/7 s/p which patient was given doxycycline for skin and soft tissue infection prophylaxis.   Clinical Impression   Patient admitted for the diagnosis above.  PTA he lives at home alone, but does have family/friends that can assist PRN.   PTA he needed no assist for ADL or mobility.  Currently he is needing Min A for basic transfers and for ADL completion, particularly in standing.  Patient will benefit from continued inpatient follow up therapy, <3 hours/day in order to maximize his functional status prior to returning home alone.  OT will follow in the acute setting to address deficits.         If plan is discharge home, recommend the following: Assist for transportation;Assistance with cooking/housework;A little help with walking and/or transfers;A little help with bathing/dressing/bathroom    Functional Status Assessment  Patient has had a recent decline in their functional status and demonstrates the ability to make significant improvements in function in a reasonable and predictable amount of time.  Equipment Recommendations  BSC/3in1    Recommendations for Other Services       Precautions / Restrictions Precautions Precautions: Fall Required Braces or Orthoses: Other Brace Other Brace: CAM Restrictions Weight Bearing Restrictions: Yes RLE Weight Bearing: Non weight bearing      Mobility Bed Mobility Overal bed mobility: Modified Independent                  Transfers Overall transfer level: Needs  assistance Equipment used: Rolling walker (2 wheels) Transfers: Sit to/from Stand, Bed to chair/wheelchair/BSC Sit to Stand: Min assist     Step pivot transfers: Min assist            Balance Overall balance assessment: Needs assistance Sitting-balance support: Bilateral upper extremity supported, Feet supported Sitting balance-Leahy Scale: Good     Standing balance support: Reliant on assistive device for balance Standing balance-Leahy Scale: Poor                             ADL either performed or assessed with clinical judgement   ADL Overall ADL's : Needs assistance/impaired Eating/Feeding: Independent;Sitting   Grooming: Wash/dry hands;Wash/dry face;Set up;Sitting   Upper Body Bathing: Set up;Sitting   Lower Body Bathing: Minimal assistance;Sit to/from stand   Upper Body Dressing : Set up;Sitting   Lower Body Dressing: Minimal assistance;Sit to/from stand   Toilet Transfer: Minimal assistance;Rolling walker (2 wheels);Regular Toilet;Ambulation                   Vision Patient Visual Report: No change from baseline       Perception Perception: Not tested       Praxis Praxis: Not tested       Pertinent Vitals/Pain Pain Assessment Pain Assessment: No/denies pain     Extremity/Trunk Assessment Upper Extremity Assessment Upper Extremity Assessment: Overall WFL for tasks assessed   Lower Extremity Assessment Lower Extremity Assessment: Defer to PT evaluation   Cervical / Trunk Assessment Cervical / Trunk Assessment: Normal   Communication  Cognition Arousal: Alert Behavior During Therapy: WFL for tasks assessed/performed Overall Cognitive Status: Within Functional Limits for tasks assessed                                       General Comments  VSS    Exercises     Shoulder Instructions      Home Living Family/patient expects to be discharged to:: Private residence Living Arrangements:  Alone Available Help at Discharge: Friend(s);Available 24 hours/day Type of Home: House Home Access: Stairs to enter [employer] of Steps: 3 Entrance Stairs-Rails: None Home Layout: One level     Bathroom Shower/Tub: Walk-in shower;Tub/shower unit   Bathroom Toilet: Standard Bathroom Accessibility: Yes   Home Equipment: Agricultural consultant (2 wheels)          Prior Functioning/Environment Prior Level of Function : Independent/Modified Independent             Mobility Comments: Ind no AD ADLs Comments: Ind        OT Problem List: Decreased activity tolerance;Impaired balance (sitting and/or standing)      OT Treatment/Interventions: Self-care/ADL training;Therapeutic activities;Patient/family education;Balance training;DME and/or AE instruction    OT Goals(Current goals can be found in the care plan section) Acute Rehab OT Goals Patient Stated Goal: Return home OT Goal Formulation: With patient Time For Goal Achievement: 07/10/23 Potential to Achieve Goals: Good ADL Goals Pt Will Perform Grooming: with supervision;standing Pt Will Perform Lower Body Bathing: with contact guard assist;sit to/from stand Pt Will Perform Lower Body Dressing: with contact guard assist;sit to/from stand Pt Will Transfer to Toilet: with supervision;regular height toilet;ambulating  OT Frequency: Min 1X/week    Co-evaluation              AM-PAC OT "6 Clicks" Daily Activity     Outcome Measure Help from another person eating meals?: None Help from another person taking care of personal grooming?: None Help from another person toileting, which includes using toliet, bedpan, or urinal?: A Little Help from another person bathing (including washing, rinsing, drying)?: A Little Help from another person to put on and taking off regular upper body clothing?: None Help from another person to put on and taking off regular lower body clothing?: A Little 6 Click Score: 21   End of  Session Equipment Utilized During Treatment: Gait belt;Rolling walker (2 wheels) Nurse Communication: Mobility status  Activity Tolerance: Patient tolerated treatment well Patient left: in bed;with call bell/phone within reach  OT Visit Diagnosis: Unsteadiness on feet (R26.81)                Time: 4132-4401 OT Time Calculation (min): 22 min Charges:  OT General Charges $OT Visit: 1 Visit OT Evaluation $OT Eval Moderate Complexity: 1 Mod  06/26/2023  RP, OTR/L  Acute Rehabilitation Services  Office:  [phone]   Suzanna Obey 06/26/2023, 5:43 PM

## 2023-06-26 NOTE — Progress Notes (Signed)
Orthopedic Tech Progress Note Patient Details:  Jeffrey Murphy 1956-03-27 [id number]  Ortho Devices Type of Ortho Device: CAM walker Ortho Device/Splint Location: RLE Ortho Device/Splint Interventions: Ordered, Application, Adjustment   Post Interventions Patient Tolerated: Well Instructions Provided: Care of device  [name] 06/26/2023, 1:02 PM

## 2023-06-26 NOTE — Progress Notes (Signed)
Physical Therapy Treatment Patient Details Name: Jeffrey Murphy MRN: [id number] DOB: 1955/12/31 Today's Date: 06/26/2023   History of Present Illness Pt is a 67 year old male admitted on 06/20/23 due to persistent swelling with concern for infected ulcer. Past medical history significant of  DMII, Hypertension , who has interim history of Right submetatarsal 2 friction blister with underlying ulceration with fat layer exposed s/p  drainage of blister on 8/7 s/p which patient was given doxycycline for skin and soft tissue infection prophylaxis.    PT Comments  Pt tolerated treatment well today. Pt today is now s/p R TMA on 8/18. Pt today was able to transfer to chair via step pivot Min A with 1 LOB noted. Per orders pt is NWB with CAM walker however no CAM walker present at time of session. NCM messaged PT a few hours later that CAM walker has finally been delivered. A knee scooter was at bedside today during session which pt states that his brother brought him from [place]. Pt educated that knee scooter is extremely unsafe for him at this time and that he is not to use it. Continuing to recommend SNF however per conversation with NCM pt has barriers to SNF with recent + cocaine upon admission. Pt could flip to HHPT pending progress. PT will continue to follow.    If plan is discharge home, recommend the following: A lot of help with walking and/or transfers;A little help with bathing/dressing/bathroom;Assistance with cooking/housework;Direct supervision/assist for medications management;Assist for transportation;Help with stairs or ramp for entrance   Can travel by private vehicle     No  Equipment Recommendations  Other (comment) (Per accepting facility)    Recommendations for Other Services       Precautions / Restrictions Precautions Precautions: Fall Required Braces or Orthoses: Other Brace Other Brace: CAM walker per chart however no CAM walker in room during  session. Restrictions Weight Bearing Restrictions: Yes RLE Weight Bearing: Non weight bearing     Mobility  Bed Mobility Overal bed mobility: Needs Assistance Bed Mobility: Supine to Sit     Supine to sit: Supervision, HOB elevated, Used rails          Transfers Overall transfer level: Needs assistance Equipment used: Rolling walker (2 wheels) Transfers: Sit to/from Stand, Bed to chair/wheelchair/BSC Sit to Stand: Min assist   Step pivot transfers: Min assist       General transfer comment: Cues for hand placement and safety with RW. Pt with 1 LOB during transfer requiring Min A to correct.    Ambulation/Gait               General Gait Details: Deferred due to lack of CAM boot in room.   Stairs             Wheelchair Mobility     Tilt Bed    Modified Rankin (Stroke Patients Only)       Balance Overall balance assessment: Needs assistance Sitting-balance support: Bilateral upper extremity supported, Feet supported Sitting balance-Leahy Scale: Good     Standing balance support: Bilateral upper extremity supported, During functional activity, Reliant on assistive device for balance Standing balance-Leahy Scale: Poor Standing balance comment: Reliant on RW                            Cognition Arousal: Alert Behavior During Therapy: WFL for tasks assessed/performed Overall Cognitive Status: Within Functional Limits for tasks assessed  General Comments: Pt A&Ox4. Pt can be hyperverbal.        Exercises      General Comments General comments (skin integrity, edema, etc.): VSS      Pertinent Vitals/Pain Pain Assessment Pain Assessment: No/denies pain    Home Living                          Prior Function            PT Goals (current goals can now be found in the care plan section) Progress towards PT goals: Progressing toward goals    Frequency    Min  1X/week      PT Plan      Co-evaluation              AM-PAC PT "6 Clicks" Mobility   Outcome Measure  Help needed turning from your back to your side while in a flat bed without using bedrails?: A Little Help needed moving from lying on your back to sitting on the side of a flat bed without using bedrails?: A Little Help needed moving to and from a bed to a chair (including a wheelchair)?: A Lot Help needed standing up from a chair using your arms (e.g., wheelchair or bedside chair)?: A Little Help needed to walk in hospital room?: A Lot Help needed climbing 3-5 steps with a railing? : Total 6 Click Score: 14    End of Session Equipment Utilized During Treatment: Gait belt Activity Tolerance: Patient tolerated treatment well Patient left: in bed;with call bell/phone within reach Nurse Communication: Mobility status PT Visit Diagnosis: Other abnormalities of gait and mobility (R26.89)     Time: 1610-9604 PT Time Calculation (min) (ACUTE ONLY): 14 min  Charges:    $Therapeutic Activity: 8-22 mins PT General Charges $$ ACUTE PT VISIT: 1 Visit                     Jeffrey Murphy, PT, DPT Acute Rehab Services [phone]    Jeffrey Murphy 06/26/2023, 2:58 PM

## 2023-06-26 NOTE — Progress Notes (Signed)
   06/26/23 1537  Spiritual Encounters  Type of Visit Initial  Care provided to: Patient  Reason for visit Routine spiritual support  OnCall Visit No   The patient was very talkative and friendly. He is sad to have lost part of his foot, but glad to to getting good care.  He's had a lot of struggles in life. His wife of 40 years did within the past year.  He lost his job he worked at for almost 40 years in 2017, and caused the beginning of finical struggles.  He has used illegal drugs for years. He want to get off drugs when he leave the hospital.  He says drugs are the devil trying to kill him.    I prayed with the patient. Asking for healing and for God's strength to overcome using drugs.  Kristinia Leavy Lile-King

## 2023-06-26 NOTE — Progress Notes (Signed)
   Subjective: 1 Day Post-Op Procedure(s) (LRB): TRANSMETATARSAL AMPUTATION (Right) DOS: 06/25/2023  Patient doing well.  Resting comfortably in bed.  No pain.  Dressings are intact with no strikethrough.  Objective: Vital signs in last 24 hours: Temp:  [97.5 F (36.4 C)-97.7 F (36.5 C)] 97.7 F (36.5 C) (08/19 1446) Pulse Rate:  [70-75] 74 (08/19 1446) Resp:  [17-18] 18 (08/19 1446) BP: (103-114)/(70-77) 111/77 (08/19 1446) SpO2:  [94 %-98 %] 96 % (08/19 1446) Weight:  [86.4 kg] 86.4 kg (08/19 0500)  Recent Labs    06/26/23 0442  HGB 13.1   Recent Labs    06/26/23 0442  WBC 13.6*  RBC 4.70  HCT 38.8*  PLT 254   Recent Labs    06/26/23 0442  NA 133*  K 4.2  CL 97*  CO2 27  BUN 19  CREATININE 0.80  GLUCOSE 333*  CALCIUM 8.9   No results for input(s): "LABPT", "INR" in the last 72 hours.  Physical Exam: Amputation site is well coapted.  Sutures and staples are intact.  No drainage.  No active bleeding.  Good potential for healing  Assessment/Plan: 1 Day Post-Op Procedure(s) (LRB): TRANSMETATARSAL AMPUTATION (Right) DOS: 06/25/2023  -Patient seen at bedside this evening. -Dressings were changed -From a surgical standpoint patient is okay for discharge. -Suspect clean margins.  From my standpoint I believe the patient would be okay for discharge on p.o. antibiotics based on the initial cultures taken 06/21/2023 -Continue nonweightbearing in the cam boot using a knee scooter or walker.  Patient has both in his room -Leave dressings clean dry and intact -From a podiatry/surgical standpoint okay for discharge. F/u in office one week.  -Will sign off   [name] 06/26/2023, 8:07 PM  [name], DPM [facility]  Dr. [name], DPM    [address] [place], [place] [address]                Office [phone]  Fax [phone]
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
## 2023-06-26 NOTE — Progress Notes (Signed)
Pharmacy Antibiotic Note- follow-up  Jeffrey Murphy is a 67 y.o. male admitted on 06/20/2023 with right foot infection. She was on doxycycline PTA. Pharmacy has been consulted for vancomycin dosing; Cefepime and metronidazole also started.  Plan: Change Vanc from 1000 to 1500 mg IV Q12H for AUC 487 / SCr 0.8 Continue Cefepime 2gm IV Q8H and Flagyl 500mg  IV Q12H per MD Monitor renal fxn, clinical progress, vanc levels if indicated F/u MRI result  Height: 5' 9.02" (175.3 cm) Weight: 86.4 kg (190 lb 7.6 oz) IBW/kg (Calculated) : 70.74  Temp (24hrs), Avg:97.6 F (36.4 C), Min:97.1 F (36.2 C), Max:97.9 F (36.6 C)  Recent Labs  Lab 06/20/23 1330 06/20/23 1335 06/20/23 1703 06/21/23 0329 06/22/23 0026 06/23/23 0233 06/26/23 0442  WBC 15.6*  --   --  13.3* 9.4  --  13.6*  CREATININE 0.93  --   --  0.70 0.72 0.78 0.80  LATICACIDVEN  --  1.0 2.2*  --   --   --   --     Estimated Creatinine Clearance: 101.6 mL/min (by C-G formula based on SCr of 0.8 mg/dL).    Allergies  Allergen Reactions   Crestor [Rosuvastatin] Other (See Comments)    Myalgias    Lipitor [Atorvastatin] Other (See Comments)    Myalgias    Antibiotics CTX 8/13 >> 8/14 Vanc 8/13 >>  Flagyl 8/13 >> Cefepime 8/14 >>   Culture Results to Date:  8/13 BCx - ngtdF 8/14 bone and abscess (RT foot)- GPC in pairs and GNR- B. frag (beta lactamase positive) 8/14 MRSA PCR (-)  Jeffrey Murphy BS, PharmD, BCPS Clinical Pharmacist 06/26/2023 8:05 AM  Contact: [phone] after 3 PM  "Be curious, not judgmental..." -Debbora Dus

## 2023-06-27 DIAGNOSIS — L089 Local infection of the skin and subcutaneous tissue, unspecified: Secondary | ICD-10-CM | POA: Diagnosis not present

## 2023-06-27 DIAGNOSIS — E11628 Type 2 diabetes mellitus with other skin complications: Secondary | ICD-10-CM | POA: Diagnosis not present

## 2023-06-27 LAB — MISC LABCORP TEST (SEND OUT)
LabCorp test name: 182261
Labcorp test code: 182861

## 2023-06-27 LAB — GLUCOSE, CAPILLARY
Glucose-Capillary: 121 mg/dL — ABNORMAL HIGH (ref 70–99)
Glucose-Capillary: 187 mg/dL — ABNORMAL HIGH (ref 70–99)
Glucose-Capillary: 160 mg/dL — ABNORMAL HIGH (ref 70–99)
Glucose-Capillary: 202 mg/dL — ABNORMAL HIGH (ref 70–99)

## 2023-06-27 NOTE — TOC Progression Note (Signed)
Transition of Care [facility]) - Progression Note    Patient Details  Name: Jeffrey Murphy MRN: [id number] Date of Birth: [birth year]  Transition of Care [facility]) CM/SW Contact  Carley Hammed, LCSW Phone Number: 06/27/2023, 1:42 PM  Clinical Narrative:    CSW spoke with several facilities who have offered, they have rescinded their bed offers due to pt's barriers. CSW left messages for [facility] to see if they can accept pt, no response yet. Per team, pt now stating he wants to go home, and would not like to pursue SNF. CSW will continue to attempt to locate a placement option, as MD is unsure if that will be an option. TOC will continue to follow for DC needs.    Expected Discharge Plan: Home w Home Health Services [facility] reviewing if they can accept him back, see note)    Expected Discharge Plan and Services   Discharge Planning Services: CM Consult Post Acute Care Choice: Home Health Living arrangements for the past 2 months: Single Family Home Expected Discharge Date: 06/27/23                           [facility] Agency:  [name]) Date HH Agency Contacted: 06/21/23 Time HH Agency Contacted: 1042 Representative spoke with at [facility] Agency: [name]   Social Determinants of Health (SDOH) Interventions SDOH Screenings   Food Insecurity: Food Insecurity Present (06/20/2023)  Housing: Low Risk  (06/21/2023)  Recent Concern: Housing - Medium Risk (06/20/2023)  Transportation Needs: Unmet Transportation Needs (06/21/2023)  Utilities: At Risk (06/20/2023)  Depression (PHQ2-9): Medium Risk (05/29/2019)  Tobacco Use: Low Risk  (06/25/2023)    Readmission Risk Interventions    06/29/2022   11:40 AM  Readmission Risk Prevention Plan  Transportation Screening Complete  PCP or Specialist Appt within 5-7 Days Complete  Home Care Screening Complete  Medication Review (RN CM) Complete

## 2023-06-27 NOTE — Progress Notes (Signed)
Physical Therapy Treatment Patient Details Name: Jeffrey Murphy MRN: [id number] DOB: [birth year] Today's Date: 06/27/2023   History of Present Illness Pt is a 67 year old male admitted on 06/20/23 due to persistent swelling with concern for infected ulcer. Past medical history significant of  DMII, Hypertension , who has interim history of Right submetatarsal 2 friction blister with underlying ulceration with fat layer exposed s/p  drainage of blister on 8/7 s/p which patient was given doxycycline for skin and soft tissue infection prophylaxis.    PT Comments  Pt tolerated treatment well today. Pt was able to progress ambulation today in room with RW CGA however required constant cues for WB precautions and was noted to WB through CAM boot once fatigued. No LOB noted today. No change in DC/DME recs at this time. Pt would continue to benefit from SNF however is adamant about HHPT today. PT will continue to follow.     If plan is discharge home, recommend the following: A lot of help with walking and/or transfers;A little help with bathing/dressing/bathroom;Assistance with cooking/housework;Direct supervision/assist for medications management;Assist for transportation;Help with stairs or ramp for entrance   Can travel by private vehicle     No  Equipment Recommendations  Other (comment) (Per accepting facility)    Recommendations for Other Services       Precautions / Restrictions Precautions Precautions: Fall Required Braces or Orthoses: Other Brace Other Brace: CAM Restrictions Weight Bearing Restrictions: Yes RLE Weight Bearing: Non weight bearing     Mobility  Bed Mobility Overal bed mobility: Modified Independent Bed Mobility: Supine to Sit     Supine to sit: Modified independent (Device/Increase time)          Transfers Overall transfer level: Needs assistance Equipment used: Rolling walker (2 wheels) Transfers: Sit to/from Stand Sit to Stand: Supervision, From  elevated surface           General transfer comment: Cues for hand placement and safety with RW. no LOB elevated. Bed elevated to simulate home environment.    Ambulation/Gait Ambulation/Gait assistance: Contact guard assist Gait Distance (Feet): 20 Feet Assistive device: Rolling walker (2 wheels) Gait Pattern/deviations: Step-to pattern (Hop to) Gait velocity: decreased     General Gait Details: Pt able to ambulate to door and back. Pt required constant cues for WB precautions and noted to start WBing through CAM boot once fatigued.   Stairs             Wheelchair Mobility     Tilt Bed    Modified Rankin (Stroke Patients Only)       Balance Overall balance assessment: Needs assistance Sitting-balance support: Bilateral upper extremity supported, Feet supported Sitting balance-Leahy Scale: Good     Standing balance support: Reliant on assistive device for balance Standing balance-Leahy Scale: Poor Standing balance comment: Reliant on RW                            Cognition Arousal: Alert Behavior During Therapy: WFL for tasks assessed/performed Overall Cognitive Status: Within Functional Limits for tasks assessed                                 General Comments: Pt A&Ox4. Pt can be hyperverbal.        Exercises      General Comments General comments (skin integrity, edema, etc.): VSS      Pertinent  Vitals/Pain Pain Assessment Pain Assessment: No/denies pain    Home Living                          Prior Function            PT Goals (current goals can now be found in the care plan section) Acute Rehab PT Goals Patient Stated Goal: to get better Progress towards PT goals: Progressing toward goals    Frequency    Min 1X/week      PT Plan      Co-evaluation              AM-PAC PT "6 Clicks" Mobility   Outcome Measure  Help needed turning from your back to your side while in a flat bed  without using bedrails?: None Help needed moving from lying on your back to sitting on the side of a flat bed without using bedrails?: None Help needed moving to and from a bed to a chair (including a wheelchair)?: A Little Help needed standing up from a chair using your arms (e.g., wheelchair or bedside chair)?: A Little Help needed to walk in hospital room?: A Little Help needed climbing 3-5 steps with a railing? : Total 6 Click Score: 18    End of Session Equipment Utilized During Treatment: Gait belt Activity Tolerance: Patient tolerated treatment well Patient left: in chair;with call bell/phone within reach;with chair alarm set Nurse Communication: Mobility status PT Visit Diagnosis: Other abnormalities of gait and mobility (R26.89)     Time: 2841-3244 PT Time Calculation (min) (ACUTE ONLY): 20 min  Charges:    $Gait Training: 8-22 mins PT General Charges $$ ACUTE PT VISIT: 1 Visit                     Jeffrey Murphy, PT, DPT Acute Rehab Services [phone]    Jeffrey Murphy 06/27/2023, 12:13 PM

## 2023-06-27 NOTE — Plan of Care (Signed)
  Problem: Pain Managment: Goal: General experience of comfort will improve Outcome: Progressing   Problem: Safety: Goal: Ability to remain free from injury will improve Outcome: Progressing   

## 2023-06-27 NOTE — Care Management Important Message (Signed)
Important Message  Patient Details  Name: Jeffrey Murphy MRN: [id number] Date of Birth: 22-Nov-1955   Medicare Important Message Given:  Yes     Sherilyn Banker 06/27/2023, 1:36 PM

## 2023-06-27 NOTE — Progress Notes (Signed)
[facility] PROGRESS NOTE  Jeffrey Murphy (DOB: 12/23/1955) NUU:[id number] PCP: [name], NP  Brief Narrative: Jeffrey Murphy is a 67 y.o. male with medical history significant of  DMII, Hypertension , who has interim history of Right submetatarsal 2 friction blister with underlying ulceration with fat layer exposed s/p drainage of blister on 8/7. He presented with evidence of infection, imaging confirming osteomyelitis. Ultimately underwent TMA. ID and podiatry agree postoperatively that the patient can complete a course of antibiotics orally. His impaired functional status will require rehabilitation. Placement is complicated by cocaine positivity.    Subjective: Sitting in chair after getting there with assistance, states he's off balance. Wore CAM boot. No fevers or chills.   Objective: BP 133/86 (BP Location: Right Arm)   Pulse 77   Temp 97.8 F (36.6 C) (Oral)   Resp 17   Ht 5' 9.02" (1.753 m)   Wt 85.4 kg   SpO2 97%   BMI 27.79 kg/m   Gen: No distress Pulm: Clear, nonlabored  CV: RRR, no MRG or edema GI: Soft, NT, ND, +BS Neuro: Alert and oriented. No new focal deficits. Ext: Warm, no deformities visualized as RLE is in CAM boot. No proximal erythema. TMA dressing grossly intact.   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 Logan Bores.  - Wound/bone culture at time of I&D 8/14 w/Dr. Lilian Kapur showing B. fragilis. ID consulted, recommending completion of 6 week course with doxycycline and augmentin (EOT being 9/24). ID follow up is scheduled 9/12.  - Discussed with podiatry, Dr. Logan Bores. Agrees with oral antibiotics and outpatient follow up in 1 week for wound recheck.  - NWB in CAM walker per podiatry. Pt not steady on transfer today, SNF recommended. CSW working on this.   - Pain control including gabapentin,  oxycodone 5-10mg  po q4h prn mod-severe pain.   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 - Continue 40u glargine qHS, increased novolog to 8u TIDWC + mod SSI and HS coverage in place of home 70/30. Will continue current regimen. Pt reports elevated glucose readings from yesterday were checked after he had eaten.    HLD:  - Continue statin  Hx polysubstance abuse:  - UDS 8/13 positive for cocaine, otherwise negative. Cessation counseling provided.   [name], MD [facility] [email or web address] 06/27/2023, 10:21 AM

## 2023-06-28 DIAGNOSIS — L089 Local infection of the skin and subcutaneous tissue, unspecified: Secondary | ICD-10-CM | POA: Diagnosis not present

## 2023-06-28 DIAGNOSIS — E11628 Type 2 diabetes mellitus with other skin complications: Principal | ICD-10-CM

## 2023-06-28 DIAGNOSIS — M86171 Other acute osteomyelitis, right ankle and foot: Secondary | ICD-10-CM | POA: Diagnosis not present

## 2023-06-28 LAB — GLUCOSE, CAPILLARY
Glucose-Capillary: 134 mg/dL — ABNORMAL HIGH (ref 70–99)
Glucose-Capillary: 187 mg/dL — ABNORMAL HIGH (ref 70–99)
Glucose-Capillary: 176 mg/dL — ABNORMAL HIGH (ref 70–99)
Glucose-Capillary: 179 mg/dL — ABNORMAL HIGH (ref 70–99)
Glucose-Capillary: 142 mg/dL — ABNORMAL HIGH (ref 70–99)

## 2023-06-28 NOTE — Plan of Care (Signed)

## 2023-06-28 NOTE — Progress Notes (Signed)
[facility]                                                                               Jeffrey Murphy, is a 67 y.o. male, DOB - 09/19/1956, ZOX:[id number] Admit date - 06/20/2023    Outpatient Primary MD for the patient is Jeffrey Aldo, NP  LOS - 8  days    Brief summary  EMRAH Murphy is a 67 y.o. male with medical history significant of  DMII, Hypertension , who has interim history of Right submetatarsal 2 friction blister with underlying ulceration with fat layer exposed s/p drainage of blister on 8/7. He presented with evidence of infection, imaging confirming osteomyelitis. Ultimately underwent TMA. ID and podiatry agree postoperatively that the patient can complete a course of antibiotics orally. His impaired functional status will require rehabilitation. Placement is complicated by cocaine positivity.    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 Logan Bores.  - Wound/bone culture at time of I&D 8/14 w/Dr. Lilian Kapur showing B. fragilis. ID consulted, recommending completion of 6 week course with doxycycline and augmentin (EOT being 9/24). ID follow up is scheduled 9/12.  - Discussed with podiatry, Dr. Logan Bores. Agrees with oral antibiotics and outpatient follow up in 1 week for wound recheck.  - NWB in CAM walker per podiatry. Pt not steady on transfer today, SNF recommended. CSW working on this.   - Pain control including gabapentin, oxycodone 5-10mg  po q4h prn mod-severe pain.   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 - CBG (last 3)  Recent Labs    06/28/23 0658 06/28/23 0827 06/28/23 1221  GLUCAP 134* 187* 176*   Resume SSI and Semglee.    HLD:  - Continue statin   Hx polysubstance abuse:  - UDS 8/13 positive for cocaine, otherwise negative. Cessation counseling provided.     Estimated body  mass index is 27.95 kg/m as calculated from the following:   Height as of this encounter: 5' 9.02" (1.753 m).   Weight as of this encounter: 85.9 kg.  Code Status: full code.  DVT Prophylaxis:  SCDs Start: 06/25/23 1029 heparin injection 5,000 Units Start: 06/20/23 2200   Level of Care: Level of care: Med-Surg Family Communication: none at bedside.  Antimicrobials:   Anti-infectives (From admission, onward)    Start     Dose/Rate Route Frequency Ordered Stop   06/26/23 2200  doxycycline (VIBRA-TABS) tablet 100 mg        100 mg Oral Every 12 hours 06/26/23 1012     06/26/23 1200  Ampicillin-Sulbactam (UNASYN) 3 g in sodium chloride 0.9 % 100 mL IVPB  Status:  Discontinued        3 g 200 mL/hr over 30 Minutes Intravenous Every 6 hours 06/26/23 0955 06/26/23 1012   06/26/23 1100  amoxicillin-clavulanate (AUGMENTIN) 875-125 MG per tablet 1 tablet        1 tablet Oral Every 12 hours 06/26/23 1012     06/26/23 0900  vancomycin (VANCOREADY) IVPB 1500 mg/300 mL  Status:  Discontinued  1,500 mg 150 mL/hr over 120 Minutes Intravenous Every 12 hours 06/26/23 0802 06/26/23 0955   06/21/23 2027  tobramycin (NEBCIN) injection  Status:  Discontinued          As needed 06/21/23 2030 06/21/23 2100   06/21/23 2027  tobramycin (NEBCIN) injection  Status:  Discontinued          As needed 06/21/23 2031 06/21/23 2100   06/21/23 2024  vancomycin (VANCOCIN) powder  Status:  Discontinued          As needed 06/21/23 2025 06/21/23 2100   06/21/23 1200  ceFEPIme (MAXIPIME) 2 g in sodium chloride 0.9 % 100 mL IVPB  Status:  Discontinued        2 g 200 mL/hr over 30 Minutes Intravenous Every 8 hours 06/21/23 1115 06/26/23 0955   06/21/23 0900  vancomycin (VANCOCIN) IVPB 1000 mg/200 mL premix  Status:  Discontinued        1,000 mg 200 mL/hr over 60 Minutes Intravenous Every 12 hours 06/20/23 2000 06/26/23 0802   06/20/23 2045  vancomycin (VANCOREADY) IVPB 1500 mg/300 mL        1,500 mg 150 mL/hr over  120 Minutes Intravenous  Once 06/20/23 1957 06/21/23 0924   06/20/23 1730  cefTRIAXone (ROCEPHIN) 2 g in sodium chloride 0.9 % 100 mL IVPB  Status:  Discontinued       Placed in "And" Linked Group   2 g 200 mL/hr over 30 Minutes Intravenous Every 24 hours 06/20/23 1721 06/21/23 1002   06/20/23 1730  metroNIDAZOLE (FLAGYL) IVPB 500 mg  Status:  Discontinued       Placed in "And" Linked Group   500 mg 100 mL/hr over 60 Minutes Intravenous Every 12 hours 06/20/23 1721 06/26/23 0955        Medications  Scheduled Meds:  amoxicillin-clavulanate  1 tablet Oral Q12H   atorvastatin  40 mg Oral QHS   doxycycline  100 mg Oral Q12H   heparin  5,000 Units Subcutaneous Q8H   insulin aspart  0-15 Units Subcutaneous TID WC   insulin aspart  0-5 Units Subcutaneous QHS   insulin aspart  8 Units Subcutaneous TID WC   insulin glargine-yfgn  40 Units Subcutaneous QHS   traZODone  50 mg Oral QHS   Continuous Infusions: PRN Meds:.albuterol, morphine injection, ondansetron **OR** ondansetron (ZOFRAN) IV, oxyCODONE    Subjective:   [name] was seen and examined today.  No new complaints. Wants to go home.  Objective:   Vitals:   06/27/23 1951 06/28/23 0500 06/28/23 0529 06/28/23 0825  BP: 113/68  106/73 109/78  Pulse: 79  64 75  Resp: 18  18 17   Temp: 98.3 F (36.8 C)  97.8 F (36.6 C) (!) 97.5 F (36.4 C)  TempSrc: Oral  Oral Oral  SpO2: 95%  94% 99%  Weight:  85.9 kg    Height:        Intake/Output Summary (Last 24 hours) at 06/28/2023 1508 Last data filed at 06/28/2023 1245 Gross per 24 hour  Intake 960 ml  Output 1350 ml  Net -390 ml   Filed Weights   06/26/23 0500 06/27/23 0500 06/28/23 0500  Weight: 86.4 kg 85.4 kg 85.9 kg     Exam General: Alert and oriented x 3, NAD Cardiovascular: S1 S2 auscultated, no murmurs, RRR Respiratory: Clear to auscultation bilaterally, no wheezing, rales or rhonchi Gastrointestinal: Soft, nontender, nondistended, + bowel sounds Ext:  no pedal edema bilaterally Neuro: AAOx3, Cr N's II- XII. Strength  5/5 upper and lower extremities bilaterally Skin: No rashes Psych: Normal affect and demeanor, alert and oriented x3    Data Reviewed:  I have personally reviewed following labs and imaging studies   CBC Lab Results  Component Value Date   WBC 13.6 (H) 06/26/2023   RBC 4.70 06/26/2023   HGB 13.1 06/26/2023   HCT 38.8 (L) 06/26/2023   MCV 82.6 06/26/2023   MCH 27.9 06/26/2023   PLT 254 06/26/2023   MCHC 33.8 06/26/2023   RDW 12.5 06/26/2023   LYMPHSABS 1.4 06/20/2023   MONOABS 0.9 06/20/2023   EOSABS 0.3 06/20/2023   BASOSABS 0.0 06/20/2023     Last metabolic panel Lab Results  Component Value Date   NA 133 (L) 06/26/2023   K 4.2 06/26/2023   CL 97 (L) 06/26/2023   CO2 27 06/26/2023   BUN 19 06/26/2023   CREATININE 0.80 06/26/2023   GLUCOSE 333 (H) 06/26/2023   GFRNONAA >60 06/26/2023   GFRAA 112 04/15/2019   CALCIUM 8.9 06/26/2023   PHOS 2.8 06/28/2022   PROT 7.6 06/21/2023   ALBUMIN 3.0 (L) 06/21/2023   LABGLOB 3.4 04/15/2019   AGRATIO 1.3 04/15/2019   BILITOT 1.1 06/21/2023   ALKPHOS 72 06/21/2023   AST 15 06/21/2023   ALT 10 06/21/2023   ANIONGAP 9 06/26/2023    CBG (last 3)  Recent Labs    06/28/23 0658 06/28/23 0827 06/28/23 1221  GLUCAP 134* 187* 176*      Coagulation Profile: No results for input(s): "INR", "PROTIME" in the last 168 hours.   Radiology Studies: No results found.     Kathlen Mody M.D. [facility] 06/28/2023, 3:08 PM  Available via Epic secure chat 7am-7pm After 7 pm, please refer to night coverage provider listed on amion.

## 2023-06-28 NOTE — TOC CM/SW Note (Signed)
    Durable Medical Equipment  (From admission, onward)           Start     Ordered   06/28/23 1454  For home use only DME standard manual wheelchair with seat cushion  Once       Comments: Patient suffers from TRANSMETATARSAL AMPUTATION (Right) which impairs their ability to perform daily activities like ambulating  in the home.  A cane  will not resolve issue with performing activities of daily living. A wheelchair will allow patient to safely perform daily activities. Patient can safely propel the wheelchair in the home or has a caregiver who can provide assistance. Length of need lifetime . Accessories: elevating leg rests (ELRs), wheel locks, extensions and anti-tippers.  Seat and back cushions   06/28/23 1454

## 2023-06-28 NOTE — TOC Progression Note (Addendum)
Transition of Care [facility]) - Progression Note    Patient Details  Name: Jeffrey Murphy MRN: [id number] Date of Birth: Apr 01, 1956  [facility]) CM/SW Contact  Nadene Rubins Adria Devon, RN Phone Number: 06/28/2023, 3:15 PM  Clinical Narrative:     Patient wants to go home with home health. Last week Lynette with [facility] agreed to provide services. NCM left her a message to confirm. Lynette returned call she can accept. Patient confirmed Deborah's phone number as best number for [facility] to call to schedule visits. [name] has Deborah's number   Patient has walker and crutches at home. PT rec wheel chair and bedside commode. Patient does not want bedside but does want wheelchair.   Team aware , asked MD to sign orders for wheel chair and home health and face to face. Ordered wheelchair with Earna Coder with [employer]   Patient reports his insurance does not cover wheel chair 100% so he does not want it. He will use knee scooter. Messaged PT and MD   Expected Discharge Plan: Home w Home Health Services [facility] reviewing if they can accept him back, see note)    Expected Discharge Plan and Services   Discharge Planning Services: CM Consult Post Acute Care Choice: Home Health Living arrangements for the past 2 months: Single Family Home Expected Discharge Date: 06/27/23                           [facility] Agency:  [name]) Date HH Agency Contacted: 06/21/23 Time HH Agency Contacted: 1042 Representative spoke with at [facility] Agency: [name]   Social Determinants of Health (SDOH) Interventions SDOH Screenings   Food Insecurity: Food Insecurity Present (06/20/2023)  Housing: Low Risk  (06/21/2023)  Recent Concern: Housing - Medium Risk (06/20/2023)  Transportation Needs: Unmet Transportation Needs (06/21/2023)  Utilities: At Risk (06/20/2023)  Depression (PHQ2-9): Medium Risk (05/29/2019)  Tobacco Use: Low Risk  (06/25/2023)    Readmission Risk Interventions    06/29/2022   11:40  AM  Readmission Risk Prevention Plan  Transportation Screening Complete  PCP or Specialist Appt within 5-7 Days Complete  Home Care Screening Complete  Medication Review (RN CM) Complete

## 2023-06-29 ENCOUNTER — Other Ambulatory Visit (HOSPITAL_COMMUNITY): Payer: Self-pay

## 2023-06-29 LAB — GLUCOSE, CAPILLARY
Glucose-Capillary: 201 mg/dL — ABNORMAL HIGH (ref 70–99)
Glucose-Capillary: 160 mg/dL — ABNORMAL HIGH (ref 70–99)
Glucose-Capillary: 192 mg/dL — ABNORMAL HIGH (ref 70–99)

## 2023-06-29 MED ORDER — AMOXICILLIN-POT CLAVULANATE 875-125 MG PO TABS
1.0000 | ORAL_TABLET | Freq: Two times a day (BID) | ORAL | 0 refills | Status: AC
  Filled 2023-06-29: qty 66, 33d supply, fill #0

## 2023-06-29 MED ORDER — ATORVASTATIN CALCIUM 40 MG PO TABS: 40.0000 mg | ORAL_TABLET | Freq: Every day | ORAL | 1 refills | Status: DC

## 2023-06-29 MED ORDER — DOXYCYCLINE HYCLATE 100 MG PO TABS
100.0000 mg | ORAL_TABLET | Freq: Two times a day (BID) | ORAL | 0 refills | Status: AC
  Filled 2023-06-29: qty 66, 33d supply, fill #0

## 2023-06-29 MED ORDER — NAPROXEN 250 MG PO TABS
250.0000 mg | ORAL_TABLET | Freq: Once | ORAL | Status: AC
  Administered 2023-06-29: 250 mg via ORAL
  Filled 2023-06-29: qty 1

## 2023-06-29 MED ORDER — OXYCODONE HCL 5 MG PO TABS
5.0000 mg | ORAL_TABLET | Freq: Four times a day (QID) | ORAL | 0 refills | Status: AC | PRN
  Filled 2023-06-29: qty 12, 3d supply, fill #0

## 2023-06-29 MED ORDER — ATORVASTATIN CALCIUM 40 MG PO TABS
40.0000 mg | ORAL_TABLET | Freq: Every day | ORAL | 1 refills | Status: AC
  Filled 2023-06-29: qty 30, 30d supply, fill #0

## 2023-06-29 MED ORDER — AMOXICILLIN-POT CLAVULANATE 875-125 MG PO TABS: 1.0000 | ORAL_TABLET | Freq: Two times a day (BID) | ORAL | 0 refills | Status: DC

## 2023-06-29 MED ORDER — OXYCODONE HCL 5 MG PO TABS: 5.0000 mg | ORAL_TABLET | Freq: Four times a day (QID) | ORAL | 0 refills | Status: DC | PRN

## 2023-06-29 MED ORDER — DOXYCYCLINE HYCLATE 100 MG PO TABS: 100.0000 mg | ORAL_TABLET | Freq: Two times a day (BID) | ORAL | 0 refills | Status: DC

## 2023-06-29 NOTE — Progress Notes (Signed)
Physical Therapy Treatment Patient Details Name: Jeffrey Murphy MRN: [id number] DOB: August 30, 1956 Today's Date: 06/29/2023   History of Present Illness Pt is a 67 year old male admitted on 06/20/23 due to persistent swelling with concern for infected ulcer. Past medical history significant of  DMII, Hypertension , who has interim history of Right submetatarsal 2 friction blister with underlying ulceration with fat layer exposed s/p  drainage of blister on 8/7 s/p which patient was given doxycycline for skin and soft tissue infection prophylaxis.    PT Comments  Pt with fair tolerance to treatment today. Pt received in bathroom after stating that he got there on his own. Pt ambulated short distance in room with RW CGA continuing to require multiple cues for WB precautions. Pt extensively educated on how to navigate stairs at home with RW via backwards hopping with assistance and chair placed at top of steps. Handout provided and pt verbalizes understanding. Pt anticipates DC home with HHPT today. PT will continue to follow if still admitted.    If plan is discharge home, recommend the following: A lot of help with walking and/or transfers;A little help with bathing/dressing/bathroom;Assistance with cooking/housework;Direct supervision/assist for medications management;Assist for transportation;Help with stairs or ramp for entrance   Can travel by private vehicle     No  Equipment Recommendations  Wheelchair (measurements PT);Wheelchair cushion (measurements PT)    Recommendations for Other Services       Precautions / Restrictions Precautions Precautions: Fall Restrictions Weight Bearing Restrictions: Yes RLE Weight Bearing: Non weight bearing     Mobility  Bed Mobility Overal bed mobility: Modified Independent Bed Mobility: Sit to Supine       Sit to supine: Modified independent (Device/Increase time)        Transfers                   General transfer comment: Pt  received up in bathroom. Pt states he got there on his own    Ambulation/Gait Ambulation/Gait assistance: Contact guard assist Gait Distance (Feet): 10 Feet Assistive device: Rolling walker (2 wheels) Gait Pattern/deviations: Step-to pattern (Hop to pattern) Gait velocity: decreased     General Gait Details: Again requires cues for WB precautions.   Stairs Stairs: Yes       General stair comments: Pt extensively educated on how to navigate stairs at home with RW via backwards hopping with assistance and chair placed at top of steps. Handout provided and pt verbalizes understanding.   Wheelchair Mobility     Tilt Bed    Modified Rankin (Stroke Patients Only)       Balance Overall balance assessment: Needs assistance Sitting-balance support: Bilateral upper extremity supported, Feet supported Sitting balance-Leahy Scale: Good     Standing balance support: Reliant on assistive device for balance Standing balance-Leahy Scale: Poor Standing balance comment: Reliant on RW                            Cognition Arousal: Alert Behavior During Therapy: WFL for tasks assessed/performed Overall Cognitive Status: Within Functional Limits for tasks assessed                                 General Comments: Pt A&Ox4. Pt can be hyperverbal.        Exercises      General Comments General comments (skin integrity, edema, etc.): VSS  Pertinent Vitals/Pain Pain Assessment Pain Assessment: Faces Faces Pain Scale: Hurts little more Pain Location: R foot Pain Descriptors / Indicators: Grimacing, Discomfort Pain Intervention(s): Monitored during session, Limited activity within patient's tolerance    Home Living                          Prior Function            PT Goals (current goals can now be found in the care plan section) Progress towards PT goals: Progressing toward goals    Frequency    Min 1X/week      PT  Plan      Co-evaluation              AM-PAC PT "6 Clicks" Mobility   Outcome Measure  Help needed turning from your back to your side while in a flat bed without using bedrails?: None Help needed moving from lying on your back to sitting on the side of a flat bed without using bedrails?: None Help needed moving to and from a bed to a chair (including a wheelchair)?: A Little Help needed standing up from a chair using your arms (e.g., wheelchair or bedside chair)?: A Little Help needed to walk in hospital room?: A Little Help needed climbing 3-5 steps with a railing? : A Lot 6 Click Score: 19    End of Session Equipment Utilized During Treatment: Gait belt Activity Tolerance: Patient tolerated treatment well Patient left: in chair;with call bell/phone within reach;with chair alarm set Nurse Communication: Mobility status PT Visit Diagnosis: Other abnormalities of gait and mobility (R26.89)     Time: 1610-9604 PT Time Calculation (min) (ACUTE ONLY): 28 min  Charges:    $Gait Training: 8-22 mins $Therapeutic Activity: 8-22 mins PT General Charges $$ ACUTE PT VISIT: 1 Visit                     Shela Nevin, PT, DPT Acute Rehab Services [phone]    [name] 06/29/2023, 9:46 AM

## 2023-06-29 NOTE — Progress Notes (Signed)
Occupational Therapy Treatment Patient Details Name: Jeffrey Murphy MRN: [id number] DOB: 01-06-1956 Today's Date: 06/29/2023   History of present illness Pt is a 67 year old male admitted on 06/20/23 due to persistent swelling with concern for infected ulcer. Past medical history significant of  DMII, Hypertension , who has interim history of Right submetatarsal 2 friction blister with underlying ulceration with fat layer exposed s/p  drainage of blister on 8/7 s/p which patient was given doxycycline for skin and soft tissue infection prophylaxis.   OT comments  Pt actively participated in therapy today, displays overall good ability to transfer/ambulate using RW as needed CGA. Pt requires increased effort for STS, but able to complete CGA. Pt easily distracted and verbose, had a few phone calls during session about DC and follow up appointments, nursing in/out of room, Pt was depressed about recent passing of wife but states he feels he is starting to feel better about his psychological health lately. Pt educated on donning/doffing RLE brace, able to complete with increased time. Pt would benefit from [facility] for home to increase safety getting on/off toilet as max effort to stand from low surface. Pt would benefit from [facility] for follow up to maximize safety in home environment, will see acutely as able.       If plan is discharge home, recommend the following:  Assist for transportation;Assistance with cooking/housework;A little help with walking and/or transfers;A little help with bathing/dressing/bathroom   Equipment Recommendations  BSC/3in1    Recommendations for Other Services      Precautions / Restrictions Precautions Precautions: Fall Restrictions Weight Bearing Restrictions: Yes RLE Weight Bearing: Non weight bearing       Mobility Bed Mobility Overal bed mobility: Modified Independent                  Transfers Overall transfer level: Needs assistance Equipment used:  Rolling walker (2 wheels) Transfers: Sit to/from Stand, Bed to chair/wheelchair/BSC Sit to Stand: Contact guard assist     Step pivot transfers: Contact guard assist     General transfer comment: CGA using RW     Balance Overall balance assessment: Needs assistance   Sitting balance-Leahy Scale: Good Sitting balance - Comments: EOB ADLs   Standing balance support: Reliant on assistive device for balance Standing balance-Leahy Scale: Poor Standing balance comment: able to reach for door/open it with 1 hand on RW, heavy reliance on RW for ambulation                           ADL either performed or assessed with clinical judgement   ADL Overall ADL's : Needs assistance/impaired Eating/Feeding: Independent;Sitting   Grooming: Contact guard assist;Standing   Upper Body Bathing: Set up;Sitting   Lower Body Bathing: Set up;Sitting/lateral leans   Upper Body Dressing : Set up;Sitting   Lower Body Dressing: Minimal assistance;Sit to/from stand Lower Body Dressing Details (indicate cue type and reason): assist to finish donning pants standing Toilet Transfer: Contact guard assist;Ambulation;Rolling walker (2 wheels)           Functional mobility during ADLs: Contact guard assist;Rolling walker (2 wheels) General ADL Comments: min A for LB dressing, able to don/doff RLE brace as needed, overall good ability to complete ADLs with set up/CGA.    Extremity/Trunk Assessment              Vision       Perception     Praxis  Cognition Arousal: Alert Behavior During Therapy: WFL for tasks assessed/performed Overall Cognitive Status: Within Functional Limits for tasks assessed                                 General Comments: Pt A&Ox4. Pt can be hyperverbal.        Exercises      Shoulder Instructions       General Comments      Pertinent Vitals/ Pain       Pain Assessment Pain Assessment: No/denies pain Faces Pain Scale:  Hurts little more Pain Location: R foot Pain Descriptors / Indicators: Grimacing, Discomfort Pain Intervention(s): Monitored during session  Home Living                                          Prior Functioning/Environment              Frequency  Min 1X/week        Progress Toward Goals  OT Goals(current goals can now be found in the care plan section)  Progress towards OT goals: Progressing toward goals  Acute Rehab OT Goals Patient Stated Goal: to decrease pain in R foot OT Goal Formulation: With patient Time For Goal Achievement: 07/10/23 Potential to Achieve Goals: Good ADL Goals Pt Will Perform Grooming: with supervision;standing Pt Will Perform Lower Body Bathing: with contact guard assist;sit to/from stand Pt Will Perform Lower Body Dressing: with contact guard assist;sit to/from stand Pt Will Transfer to Toilet: with supervision;regular height toilet;ambulating  Plan      Co-evaluation                 AM-PAC OT "6 Clicks" Daily Activity     Outcome Measure   Help from another person eating meals?: None Help from another person taking care of personal grooming?: None Help from another person toileting, which includes using toliet, bedpan, or urinal?: A Little Help from another person bathing (including washing, rinsing, drying)?: A Little Help from another person to put on and taking off regular upper body clothing?: None Help from another person to put on and taking off regular lower body clothing?: A Little 6 Click Score: 21    End of Session Equipment Utilized During Treatment: Gait belt;Rolling walker (2 wheels)  OT Visit Diagnosis: Unsteadiness on feet (R26.81)   Activity Tolerance Patient tolerated treatment well   Patient Left in bed;with call bell/phone within reach   Nurse Communication Mobility status        Time: 1610-9604 OT Time Calculation (min): 42 min  Charges: OT General Charges $OT Visit: 1  Visit OT Treatments $Self Care/Home Management : 23-37 mins $Therapeutic Activity: 8-22 mins  Carynn Felling, OTR/L   [name] 06/29/2023, 1:28 PM

## 2023-06-29 NOTE — TOC Transition Note (Signed)
Transition of Care [facility]) - CM/SW Discharge Note   Patient Details  Name: Jeffrey Murphy MRN: [id number] Date of Birth: 02/24/1956  Transition [facility]) CM/SW Contact:  Lockie Pares, RN Phone Number: 06/29/2023, 9:34 AM   Clinical Narrative:     Patient discharging today. HH order in and will be serviced by [facility]. Will let [facility] know patient is DC today.  Final next level of care: Home w Home Health Services Barriers to Discharge: No Barriers Identified   Patient Goals and CMS Choice CMS Medicare.gov Compare Post Acute Care list provided to:: Patient Choice offered to / list presented to : Patient  Discharge Placement                         Discharge Plan and Services Additional resources added to the After Visit Summary for     Discharge Planning Services: CM Consult Post Acute Care Choice: Home Health                    HH Arranged: PT, OT HH Agency:  [name]) Date [facility] Agency Contacted: 06/21/23 Time HH Agency Contacted: [phone] Representative spoke with at [facility] Agency: [name]  Social Determinants of Health (SDOH) Interventions SDOH Screenings   Food Insecurity: Food Insecurity Present (06/20/2023)  Housing: Low Risk  (06/21/2023)  Recent Concern: Housing - Medium Risk (06/20/2023)  Transportation Needs: Unmet Transportation Needs (06/21/2023)  Utilities: At Risk (06/20/2023)  Depression (PHQ2-9): Medium Risk (05/29/2019)  Tobacco Use: Low Risk  (06/25/2023)     Readmission Risk Interventions    06/29/2022   11:40 AM  Readmission Risk Prevention Plan  Transportation Screening Complete  PCP or Specialist Appt within 5-7 Days Complete  Home Care Screening Complete  Medication Review (RN CM) Complete

## 2023-06-29 NOTE — Plan of Care (Signed)
Problem: Education: Goal: Knowledge of General Education information will improve Description: Including pain rating scale, medication(s)/side effects and non-pharmacologic comfort measures 06/29/2023 1011 by Johnney Killian, RN Outcome: Adequate for Discharge 06/29/2023 1011 by Johnney Killian, RN Outcome: Progressing   Problem: Health Behavior/Discharge Planning: Goal: Ability to manage health-related needs will improve 06/29/2023 1011 by Johnney Killian, RN Outcome: Adequate for Discharge 06/29/2023 1011 by Johnney Killian, RN Outcome: Progressing   Problem: Clinical Measurements: Goal: Ability to maintain clinical measurements within normal limits will improve 06/29/2023 1011 by Johnney Killian, RN Outcome: Adequate for Discharge 06/29/2023 1011 by Johnney Killian, RN Outcome: Progressing Goal: Will remain free from infection 06/29/2023 1011 by Johnney Killian, RN Outcome: Adequate for Discharge 06/29/2023 1011 by Johnney Killian, RN Outcome: Progressing Goal: Diagnostic test results will improve 06/29/2023 1011 by Johnney Killian, RN Outcome: Adequate for Discharge 06/29/2023 1011 by Johnney Killian, RN Outcome: Progressing Goal: Respiratory complications will improve 06/29/2023 1011 by Johnney Killian, RN Outcome: Adequate for Discharge 06/29/2023 1011 by Johnney Killian, RN Outcome: Progressing Goal: Cardiovascular complication will be avoided 06/29/2023 1011 by Johnney Killian, RN Outcome: Adequate for Discharge 06/29/2023 1011 by Johnney Killian, RN Outcome: Progressing   Problem: Activity: Goal: Risk for activity intolerance will decrease 06/29/2023 1011 by Johnney Killian, RN Outcome: Adequate for Discharge 06/29/2023 1011 by Johnney Killian, RN Outcome: Progressing   Problem: Nutrition: Goal: Adequate nutrition will be maintained 06/29/2023 1011 by Johnney Killian, RN Outcome: Adequate for Discharge 06/29/2023 1011 by Johnney Killian, RN Outcome: Progressing    Problem: Coping: Goal: Level of anxiety will decrease 06/29/2023 1011 by Johnney Killian, RN Outcome: Adequate for Discharge 06/29/2023 1011 by Johnney Killian, RN Outcome: Progressing   Problem: Elimination: Goal: Will not experience complications related to bowel motility 06/29/2023 1011 by Johnney Killian, RN Outcome: Adequate for Discharge 06/29/2023 1011 by Johnney Killian, RN Outcome: Progressing Goal: Will not experience complications related to urinary retention 06/29/2023 1011 by Johnney Killian, RN Outcome: Adequate for Discharge 06/29/2023 1011 by Johnney Killian, RN Outcome: Progressing   Problem: Pain Managment: Goal: General experience of comfort will improve 06/29/2023 1011 by Johnney Killian, RN Outcome: Adequate for Discharge 06/29/2023 1011 by Johnney Killian, RN Outcome: Progressing   Problem: Safety: Goal: Ability to remain free from injury will improve 06/29/2023 1011 by Johnney Killian, RN Outcome: Adequate for Discharge 06/29/2023 1011 by Johnney Killian, RN Outcome: Progressing   Problem: Skin Integrity: Goal: Risk for impaired skin integrity will decrease 06/29/2023 1011 by Johnney Killian, RN Outcome: Adequate for Discharge 06/29/2023 1011 by Johnney Killian, RN Outcome: Progressing   Problem: Education: Goal: Ability to describe self-care measures that may prevent or decrease complications (Diabetes Survival Skills Education) will improve 06/29/2023 1011 by Johnney Killian, RN Outcome: Adequate for Discharge 06/29/2023 1011 by Johnney Killian, RN Outcome: Progressing Goal: Individualized Educational Video(s) 06/29/2023 1011 by Johnney Killian, RN Outcome: Adequate for Discharge 06/29/2023 1011 by Johnney Killian, RN Outcome: Progressing   Problem: Coping: Goal: Ability to adjust to condition or change in health will improve 06/29/2023 1011 by Johnney Killian, RN Outcome: Adequate for Discharge 06/29/2023 1011 by Johnney Killian, RN Outcome:  Progressing   Problem: Fluid Volume: Goal: Ability to maintain a balanced intake and output will improve 06/29/2023 1011 by Johnney Killian, RN Outcome: Adequate for Discharge 06/29/2023 1011 by Tresa Endo,  Clearnce Leja E, RN Outcome: Progressing   Problem: Health Behavior/Discharge Planning: Goal: Ability to identify and utilize available resources and services will improve 06/29/2023 1011 by Johnney Killian, RN Outcome: Adequate for Discharge 06/29/2023 1011 by Johnney Killian, RN Outcome: Progressing Goal: Ability to manage health-related needs will improve 06/29/2023 1011 by Johnney Killian, RN Outcome: Adequate for Discharge 06/29/2023 1011 by Johnney Killian, RN Outcome: Progressing   Problem: Metabolic: Goal: Ability to maintain appropriate glucose levels will improve 06/29/2023 1011 by Johnney Killian, RN Outcome: Adequate for Discharge 06/29/2023 1011 by Johnney Killian, RN Outcome: Progressing   Problem: Nutritional: Goal: Maintenance of adequate nutrition will improve 06/29/2023 1011 by Johnney Killian, RN Outcome: Adequate for Discharge 06/29/2023 1011 by Johnney Killian, RN Outcome: Progressing Goal: Progress toward achieving an optimal weight will improve 06/29/2023 1011 by Johnney Killian, RN Outcome: Adequate for Discharge 06/29/2023 1011 by Johnney Killian, RN Outcome: Progressing   Problem: Skin Integrity: Goal: Risk for impaired skin integrity will decrease 06/29/2023 1011 by Johnney Killian, RN Outcome: Adequate for Discharge 06/29/2023 1011 by Johnney Killian, RN Outcome: Progressing   Problem: Tissue Perfusion: Goal: Adequacy of tissue perfusion will improve 06/29/2023 1011 by Johnney Killian, RN Outcome: Adequate for Discharge 06/29/2023 1011 by Johnney Killian, RN Outcome: Progressing

## 2023-07-05 ENCOUNTER — Telehealth: Payer: Self-pay

## 2023-07-05 ENCOUNTER — Ambulatory Visit: Payer: Medicare PPO | Admitting: Podiatry

## 2023-07-05 NOTE — Telephone Encounter (Signed)
Caregiver called this morning. Patient was unable to make his appointment due to transportation issue - He has rescheduled, but needs refills on antibiotic and pain medicine sent to [facility] on [address]. Caregiver phone [phone]

## 2023-07-05 NOTE — Telephone Encounter (Signed)
Pts caregiver(friend) called again checking status of the mediation refills they requested this morning.

## 2023-07-06 ENCOUNTER — Other Ambulatory Visit: Payer: Self-pay | Admitting: Podiatry

## 2023-07-06 MED ORDER — DOXYCYCLINE HYCLATE 100 MG PO TABS: 100.0000 mg | ORAL_TABLET | Freq: Two times a day (BID) | ORAL | 0 refills | Status: AC

## 2023-07-06 MED ORDER — OXYCODONE-ACETAMINOPHEN 5-325 MG PO TABS: 1.0000 | ORAL_TABLET | ORAL | 0 refills | Status: AC | PRN

## 2023-07-12 NOTE — Discharge Summary (Signed)
Physician Discharge Summary   Patient: Jeffrey Murphy MRN: [id number] DOB: [birth year]  Admit date:     06/20/2023  Discharge date: 06/29/2023  Discharge Physician: Kathlen Mody   PCP: [name], NP   Recommendations at discharge:  Please follow up with PCP in one wee  Discharge Diagnoses: Principal Problem:   Diabetic foot infection (HCC) Active Problems:   Pyogenic inflammation of bone (HCC)   Acute osteomyelitis of right foot (HCC)   Type 2 diabetes mellitus with right diabetic foot infection [facility])    Hospital Course: 67 y.o. male with medical history significant of  DMII, Hypertension , who has interim history of Right submetatarsal 2 friction blister with underlying ulceration with fat layer exposed s/p drainage of blister on 8/7. He presented with evidence of infection, imaging confirming osteomyelitis. Ultimately underwent TMA. ID and podiatry agree postoperatively that the patient can complete a course of antibiotics orally. His impaired functional status will require rehabilitation. Placement is complicated by cocaine positivity.   Assessment and Plan:   Right 3rd metatarsal acute osteomyelitis with associated septic joint and plantar pyomyositis: Now s/p right foot transmetatarsal amputation and tendo achilles lengthening 8/18 by Dr. Logan Bores.  - Wound/bone culture at time of I&D 8/14 w/Dr. Lilian Kapur showing B. fragilis. ID consulted, recommending completion of 6 week course with doxycycline and augmentin (EOT being 9/24). ID follow up is scheduled 9/12.  - Discussed with podiatry, Dr. Logan Bores. Agrees with oral antibiotics and outpatient follow up in 1 week for wound recheck.  - NWB in CAM walker per podiatry. Pt not steady on transfer today, SNF recommended. CSW working on this.   - Pain control including gabapentin, oxycodone 5-10mg  po q4h prn mod-severe pain.   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 - CBG (last 3)  Recent Labs (last 2 labs)       Recent Labs    06/28/23 0658  06/28/23 0827 06/28/23 1221  GLUCAP 134* 187* 176*      Resume SSI and Semglee.    HLD:  - Continue statin   Hx polysubstance abuse:  - UDS 8/13 positive for cocaine, otherwise negative. Cessation counseling provided.      Estimated body mass index is 27.95 kg/m as calculated from the following:   Height as of this encounter: 5' 9.02" (1.753 m).   Weight as of this encounter: 85.9 kg.   DISCHARGE MEDICATION: Allergies as of 06/29/2023       Reactions   Crestor [rosuvastatin] Other (See Comments)   Myalgias    Lipitor [atorvastatin] Other (See Comments)   Myalgias         Medication List     STOP taking these medications    naproxen sodium 220 MG tablet Commonly known as: ALEVE       TAKE these medications    amoxicillin-clavulanate 875-125 MG tablet Commonly known as: AUGMENTIN Take 1 tablet by mouth every 12 (twelve) hours.   atorvastatin 40 MG tablet Commonly known as: LIPITOR Take 1 tablet (40 mg total) by mouth at bedtime.   blood glucose meter kit and supplies Kit Dispense based on patient and insurance preference. Use up to four times daily as directed.   doxycycline 100 MG tablet Commonly known as: VIBRA-TABS Take 1 tablet (100 mg total) by mouth 2 (two) times daily.   glucose blood test strip Commonly known as: Contour Next Test Use as instructed   True Metrix Blood Glucose Test test strip Generic drug: glucose blood Use as directed   insulin NPH-regular Human (70-30)  100 UNIT/ML injection Inject 30 Units into the skin 2 (two) times daily with a meal.   Insulin Syringes (Disposable) U-100 0.5 ML Misc Use as directed for insulin   lisinopril 30 MG tablet Commonly known as: ZESTRIL Take 30 mg by mouth every evening.   Microlet Lancets Misc 1 each by Subdermal route 3 (three) times daily.   Microlet Next Lancing Device Misc 1 each by Does not apply route 3 (three) times daily.   multivitamin tablet Take 1 tablet by mouth  daily.   traZODone 50 MG tablet Commonly known as: DESYREL Take 50 mg by mouth at bedtime.   Vitamin C Chew Chew 2 tablets by mouth daily.       ASK your doctor about these medications    oxyCODONE 5 MG immediate release tablet Commonly known as: Oxy IR/ROXICODONE Take 1 tablet (5 mg total) by mouth every 6 (six) hours as needed for up to 5 days for moderate pain or severe pain. Ask about: Should I take this medication?        Follow-up Information     [facility] Follow up.   Specialty: Home Health Services Contact information: [address] [place] [address] [phone]                Discharge Exam: Ceasar Mons Weights   06/27/23 0500 06/28/23 0500 06/29/23 0537  Weight: 85.4 kg 85.9 kg 85.2 kg   General exam: Appears calm and comfortable  Respiratory system: Clear to auscultation. Respiratory effort normal. Cardiovascular system: S1 & S2 heard, RRR. No JVD, murmurs, rubs, gallops or clicks. No pedal edema. Gastrointestinal system: Abdomen is nondistended, soft and nontender. No organomegaly or masses felt. Normal bowel sounds heard. Central nervous system: Alert and oriented. No focal neurological deficits. Extremities: Symmetric 5 x 5 power. Skin: No rashes,  Psychiatry: Judgement and insight appear normal. Mood & affect appropriate.      The results of significant diagnostics from this hospitalization (including imaging, microbiology, ancillary and laboratory) are listed below for reference.   Imaging Studies: DG Foot Complete Right  Result Date: 06/25/2023 CLINICAL DATA:  Postop check EXAM: RIGHT FOOT COMPLETE - 3+ VIEW COMPARISON:  06/20/2023 FINDINGS: Interval amputation of the metatarsals and phalanges. Partial amputation of the distal half of medial cuneiform. No acute fracture or dislocation. Tiny plantar calcaneal spur. Enthesopathic changes of the Achilles tendon insertion. Small amount of soft tissue  emphysema along the posterolateral right lower leg likely postsurgical. IMPRESSION: 1. Interval amputation of the metatarsals and phalanges. Partial amputation of the distal half of medial cuneiform. Electronically Signed   By: Elige Ko M.D.   On: 06/25/2023 13:24   DG MINI C-ARM IMAGE ONLY  Result Date: 06/25/2023 There is no interpretation for this exam.  This order is for images obtained during a surgical procedure.  Please See "Surgeries" Tab for more information regarding the procedure.   PERIPHERAL VASCULAR CATHETERIZATION  Result Date: 06/21/2023 See surgical note for result.  MR FOOT RIGHT W WO CONTRAST  Result Date: 06/21/2023 CLINICAL DATA:  Foot swelling, diabetes, osteomyelitis EXAM: MRI OF THE RIGHT FOREFOOT WITHOUT AND WITH CONTRAST TECHNIQUE: Multiplanar, multisequence MR imaging of the right forefoot was performed before and after the administration of intravenous contrast. CONTRAST:  9mL GADAVIST GADOBUTROL 1 MMOL/ML IV SOLN COMPARISON:  None Available. FINDINGS: Bones/Joint/Cartilage No fracture or dislocation. Hallux valgus. Moderate osteoarthritis of the first MTP joint. No joint effusion. Mild marrow edema in the distal  first metatarsal diaphysis. Mild marrow edema in the medial and lateral cuneiform. Mild marrow edema in the cuboid. Severe bone marrow edema in the third metatarsal head and base of the third proximal phalanx with a small third MCP joint effusion most concerning for septic arthritis and osteomyelitis. Complex peripherally enhancing fluid collection along the plantar aspect of the foot emanating from the region of the third MTP joint along the plantar aspect measuring 4 x 1.8 x 3.3 cm concerning for an abscess deep to the flexor digitorum brevis muscle. Ligaments Collateral ligaments are intact.  Lisfranc ligament is intact. Muscles and Tendons Flexor, peroneal and extensor compartment tendons are intact. Mild edema throughout the plantar musculature which may be  reactive secondary to neurogenic etiology versus myositis. Soft tissue No other fluid collection or hematoma. No soft tissue mass. Edema throughout the visualized foot. IMPRESSION: 1. Severe bone marrow edema in the third metatarsal head and base of the third proximal phalanx with a small third MCP joint effusion most concerning for septic arthritis and osteomyelitis. 2. Complex peripherally enhancing fluid collection along the plantar aspect of the foot emanating from the region of the third MTP joint along the plantar aspect measuring 4 x 1.8 x 3.3 cm concerning for an abscess deep to the flexor digitorum brevis muscle. 3. Mild marrow edema in the distal first metatarsal diaphysis. Mild marrow edema in the medial and lateral cuneiform. Mild marrow edema in the cuboid. These findings are nonspecific and may reflect stress reaction versus less likely early osteomyelitis. Electronically Signed   By: Elige Ko M.D.   On: 06/21/2023 09:21   DG Foot Complete Right  Result Date: 06/20/2023 CLINICAL DATA:  Foot ulcer. EXAM: RIGHT FOOT COMPLETE - 3+ VIEW COMPARISON:  07/14/2022 FINDINGS: Hallux valgus deformity. Negative for fracture or dislocation. Calcifications or ossifications along the plantar soft tissues. Calcaneal enthesopathic changes at the Achilles tendon insertion site. No evidence for bone destruction or osteomyelitis. IMPRESSION: 1. No acute bone abnormality to the right foot. 2. Hallux valgus deformity. Electronically Signed   By: Richarda Overlie M.D.   On: 06/20/2023 16:54    Microbiology: Results for orders placed or performed during the hospital encounter of 06/20/23  Culture, blood (routine x 2)     Status: None   Collection Time: 06/20/23  3:29 PM   Specimen: BLOOD  Result Value Ref Range Status   Specimen Description BLOOD SITE NOT SPECIFIED  Final   Special Requests   Final    BOTTLES DRAWN AEROBIC AND ANAEROBIC Blood Culture adequate volume   Culture   Final    NO GROWTH 5  DAYS Performed at [facility], [address] [place], [place] [address]    Report Status 06/25/2023 FINAL  Final  Culture, blood (routine x 2)     Status: None   Collection Time: 06/20/23  4:46 PM   Specimen: BLOOD RIGHT HAND  Result Value Ref Range Status   Specimen Description BLOOD RIGHT HAND  Final   Special Requests   Final    BOTTLES DRAWN AEROBIC AND ANAEROBIC Blood Culture adequate volume   Culture   Final    NO GROWTH 5 DAYS Performed at [facility], [address] [place], [place] [address]    Report Status 06/25/2023 FINAL  Final  MRSA Next Gen by PCR, Nasal     Status: None   Collection Time: 06/21/23  8:43 AM   Specimen: Nasal Mucosa; Nasal Swab  Result Value Ref Range  Status   MRSA by PCR Next Gen NOT DETECTED NOT DETECTED Final    Comment: (NOTE) The GeneXpert MRSA Assay (FDA approved for NASAL specimens only), is one component of a comprehensive MRSA colonization surveillance program. It is not intended to diagnose MRSA infection nor to guide or monitor treatment for MRSA infections. Test performance is not FDA approved in patients less than 51 years old. Performed at [facility], [address] [place], [place] [address]   Aerobic/Anaerobic Culture w Gram Stain (surgical/deep wound)     Status: None (Preliminary result)   Collection Time: 06/21/23  8:49 PM   Specimen: Abscess  Result Value Ref Range Status   Specimen Description ABSCESS  Final   Special Requests RIGHT FOOT  Final   Gram Stain   Final    ABUNDANT WBC PRESENT, PREDOMINANTLY PMN FEW GRAM POSITIVE COCCI IN PAIRS FEW GRAM POSITIVE RODS    Culture   Final    ABUNDANT GRAM POSITIVE RODS MODERATE BACTEROIDES FRAGILIS BETA LACTAMASE POSITIVE SENT GRAM POSITIVE ROD TO [facility] FOR ID Performed at [facility], [address], [place], [place] [address]    Report Status PENDING  Incomplete  Aerobic/Anaerobic Culture w Gram Stain (surgical/deep wound)     Status:  None   Collection Time: 06/21/23  8:49 PM   Specimen: Bone; Tissue  Result Value Ref Range Status   Specimen Description BONE  Final   Special Requests NONE  Final   Gram Stain   Final    RARE WBC PRESENT, PREDOMINANTLY PMN FEW GRAM POSITIVE COCCI    Culture   Final    FEW GRAM POSITIVE RODS FEW BACTEROIDES FRAGILIS BETA LACTAMASE POSITIVE SENT GRAM POSITIVE ROD TO [facility] FOR IDENTIFICATION Performed at [facility], [address] [place], [place] [address]    Report Status 06/26/2023 FINAL  Final    Labs: CBC: No results for input(s): "WBC", "NEUTROABS", "HGB", "HCT", "MCV", "PLT" in the last 168 hours. Basic Metabolic Panel: No results for input(s): "NA", "K", "CL", "CO2", "GLUCOSE", "BUN", "CREATININE", "CALCIUM", "MG", "PHOS" in the last 168 hours. Liver Function Tests: No results for input(s): "AST", "ALT", "ALKPHOS", "BILITOT", "PROT", "ALBUMIN" in the last 168 hours. CBG: No results for input(s): "GLUCAP" in the last 168 hours.  Discharge time spent: 32 min   Signed: Kathlen Mody, MD [facility]

## 2023-07-13 ENCOUNTER — Telehealth: Payer: Self-pay | Admitting: Podiatry

## 2023-07-13 NOTE — Telephone Encounter (Signed)
I wasn't sure who to route this to since a few of our physicians have seen this patient.  Jeffrey Murphy from [facility] called and wants to know if we have orders for his right partial foot amputation.  It's been wrapped since 8/13, and has not been changed.  Any dressing orders for this patient?  The family would like to know  [name]'s call back number [phone]

## 2023-07-14 ENCOUNTER — Ambulatory Visit (INDEPENDENT_AMBULATORY_CARE_PROVIDER_SITE_OTHER): Payer: Medicare PPO | Admitting: Podiatry

## 2023-07-14 DIAGNOSIS — Z9889 Other specified postprocedural states: Secondary | ICD-10-CM

## 2023-07-14 DIAGNOSIS — Z89431 Acquired absence of right foot: Secondary | ICD-10-CM

## 2023-07-14 MED ORDER — OXYCODONE-ACETAMINOPHEN 5-325 MG PO TABS: 1.0000 | ORAL_TABLET | ORAL | 0 refills | Status: AC | PRN

## 2023-07-14 NOTE — Progress Notes (Signed)
Subjective:  Patient ID: Jeffrey Murphy, male    DOB: September 18, 1956,  MRN: [id number]  Chief Complaint  Patient presents with   Routine Post Op    DOS: 06/25/2023 Procedure: Right transmetatarsal amputation  67 y.o. male returns for post-op check.  Patient states that he is doing well.  Denies any other acute complaints minimal pain.  Nonweightbearing to the right lower extremity.  Review of Systems: Negative except as noted in the HPI. Denies N/V/F/Ch.  Past Medical History:  Diagnosis Date   Abscess 12/2016   LEFT INGUINAL ABSCESS   Arthritis    Diabetes mellitus without complication (HCC)    Hypertension    Wears glasses    reading    Current Outpatient Medications:    oxyCODONE-acetaminophen (PERCOCET) 5-325 MG tablet, Take 1 tablet by mouth every 4 (four) hours as needed for severe pain., Disp: 30 tablet, Rfl: 0   amoxicillin-clavulanate (AUGMENTIN) 875-125 MG tablet, Take 1 tablet by mouth every 12 (twelve) hours., Disp: 66 tablet, Rfl: 0   atorvastatin (LIPITOR) 40 MG tablet, Take 1 tablet (40 mg total) by mouth at bedtime., Disp: 30 tablet, Rfl: 1   Bioflavonoid Products (VITAMIN C) CHEW, Chew 2 tablets by mouth daily., Disp: , Rfl:    blood glucose meter kit and supplies KIT, Dispense based on patient and insurance preference. Use up to four times daily as directed., Disp: 1 each, Rfl: 0   doxycycline (VIBRA-TABS) 100 MG tablet, Take 1 tablet (100 mg total) by mouth 2 (two) times daily., Disp: 66 tablet, Rfl: 0   doxycycline (VIBRA-TABS) 100 MG tablet, Take 1 tablet (100 mg total) by mouth 2 (two) times daily., Disp: 20 tablet, Rfl: 0   glucose blood (CONTOUR NEXT TEST) test strip, Use as instructed, Disp: 100 each, Rfl: 11   glucose blood (TRUE METRIX BLOOD GLUCOSE TEST) test strip, Use as directed, Disp: 100 each, Rfl: 0   insulin NPH-regular Human (70-30) 100 UNIT/ML injection, Inject 30 Units into the skin 2 (two) times daily with a meal., Disp: 10 mL, Rfl: 1   Insulin  Syringes, Disposable, U-100 0.5 ML MISC, Use as directed for insulin, Disp: 100 each, Rfl: 6   Lancet Devices (MICROLET NEXT LANCING DEVICE) MISC, 1 each by Does not apply route 3 (three) times daily., Disp: 1 each, Rfl: 12   lisinopril (ZESTRIL) 30 MG tablet, Take 30 mg by mouth every evening., Disp: , Rfl:    Microlet Lancets MISC, 1 each by Subdermal route 3 (three) times daily., Disp: 100 each, Rfl: 12   Multiple Vitamin (MULTIVITAMIN) tablet, Take 1 tablet by mouth daily., Disp: , Rfl:    oxyCODONE-acetaminophen (PERCOCET) 5-325 MG tablet, Take 1 tablet by mouth every 4 (four) hours as needed for severe pain., Disp: 30 tablet, Rfl: 0   traZODone (DESYREL) 50 MG tablet, Take 50 mg by mouth at bedtime., Disp: , Rfl:   Social History   Tobacco Use  Smoking Status Never  Smokeless Tobacco Never    Allergies  Allergen Reactions   Crestor [Rosuvastatin] Other (See Comments)    Myalgias    Lipitor [Atorvastatin] Other (See Comments)    Myalgias    Objective:  There were no vitals filed for this visit. There is no height or weight on file to calculate BMI. Constitutional Well developed. Well nourished.  Vascular Foot warm and well perfused. Capillary refill normal to all digits.   Neurologic Normal speech. Oriented to person, place, and time. Epicritic sensation to light touch grossly  present bilaterally.  Dermatologic Skin healing well without signs of infection. Skin edges well coapted without signs of infection.  Orthopedic: Tenderness to palpation noted about the surgical site.   Radiographs: None Assessment:   1. History of transmetatarsal amputation of right foot (HCC)   2. Status post foot surgery    Plan:  Patient was evaluated and treated and all questions answered.  S/p foot surgery right -Progressing as expected post-operatively. -XR: See above -WB Status: Nonweightbearing in right lower extremity -Sutures: Intact.  No clinical signs of Deis is noted no  complication noted. -Medications: Percocet -Foot redressed.  Return in about 2 weeks (around 07/28/2023).

## 2023-07-20 ENCOUNTER — Inpatient Hospital Stay: Payer: Medicare PPO | Admitting: Internal Medicine

## 2023-07-27 LAB — AEROBIC/ANAEROBIC CULTURE W GRAM STAIN (SURGICAL/DEEP WOUND)

## 2023-07-31 ENCOUNTER — Ambulatory Visit (INDEPENDENT_AMBULATORY_CARE_PROVIDER_SITE_OTHER): Payer: Medicare PPO | Admitting: Podiatry

## 2023-07-31 DIAGNOSIS — Z89431 Acquired absence of right foot: Secondary | ICD-10-CM

## 2023-07-31 NOTE — Progress Notes (Signed)
Chief Complaint  Patient presents with   Routine Post Op    DOS: 06/25/2023 Procedure: Right transmetatarsal amputation. Staples and sutures are intact. Stabbing pains to his right foot at times.     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  DOS: 06/25/2023.  Patient doing well.  He is NWB in the cam boot with the assistance of the knee scooter.  No new complaints  Past Medical History:  Diagnosis Date   Abscess 12/2016   LEFT INGUINAL ABSCESS   Arthritis    Diabetes mellitus without complication (HCC)    Hypertension    Wears glasses    reading    Past Surgical History:  Procedure Laterality Date   APPLICATION OF WOUND VAC Right 06/21/2023   Procedure: APPLICATION OF WOUND VAC;  Surgeon: Edwin Cap, DPM;  [facility];  Service: Orthopedics/Podiatry;  Laterality: Right;   COLONOSCOPY     CYST REMOVAL NECK     INCISION AND DRAINAGE ABSCESS N/A 07/02/2022   Procedure: INCISION AND DRAINAGE ABSCESS;  Surgeon: Suzanna Obey, MD;  Location: [facility] OR;  Service: ENT;  Laterality: N/A;   INGUINAL HERNIA REPAIR     right   IRRIGATION AND DEBRIDEMENT ABSCESS Left 12/12/2016   Procedure: IRRIGATION AND DEBRIDEMENT ABSCESS;  Surgeon: Almond Lint, MD;  [facility];  Service: General;  Laterality: Left;   IRRIGATION AND DEBRIDEMENT FOOT Right 06/21/2023   Procedure: IRRIGATION AND DEBRIDEMENT FOOT, CULTURES, BONE CULTURE AND PATHOLOGY;  Surgeon: Edwin Cap, DPM;  [facility];  Service: Orthopedics/Podiatry;  Laterality: Right;   JOINT REPLACEMENT     right total knee   PILONIDAL CYST EXCISION     back   QUADRICEPS TENDON REPAIR Right 12/24/2014   Procedure: RIGHT QUADRICEP TENDON REPAIR ;  Surgeon: Cheral Almas, MD;  Location: [facility];  Service: Orthopedics;  Laterality: Right;   REPLACEMENT TOTAL KNEE     TRANSMETATARSAL AMPUTATION Right 06/25/2023   Procedure: TRANSMETATARSAL  AMPUTATION;  Surgeon: [name], DPM;  [facility];  Service: Orthopedics/Podiatry;  Laterality: Right;    Allergies  Allergen Reactions   Crestor [Rosuvastatin] Other (See Comments)    Myalgias    Lipitor [Atorvastatin] Other (See Comments)    Myalgias     Objective/Physical Exam Neurovascular status intact.  Incision well coapted with sutures and staples intact. No sign of infectious process noted. No dehiscence. No active bleeding noted.  Overall well-healing surgical amputation site  DG Foot Complete Right  06/25/2023:  IMPRESSION: 1. Interval amputation of the metatarsals and phalanges. Partial amputation of the distal half of medial cuneiform.  Assessment: 1. s/p Lisfranc amputation right foot. DOS: 06/25/2023   Plan of Care:  -Patient was evaluated.  -Sutures and staples removed today -Recommend Betadine wet-to-dry dressings weekly.  A nurse comes to the home weekly.  Orders were placed for wound dressing changes and provided for the patient to give to the nurse -Continue NWB surgical amputation foot -Return to clinic 4 weeks.  At this time we will transfer the patient to weightbearing and arrange diabetic shoes and insoles  [name], DPM [facility]  Dr. [name], DPM    [address]  [place], [place] [address]                Office [phone]  Fax [phone]

## 2023-08-01 ENCOUNTER — Other Ambulatory Visit: Payer: Self-pay

## 2023-08-01 ENCOUNTER — Encounter: Payer: Self-pay | Admitting: Infectious Diseases

## 2023-08-01 ENCOUNTER — Ambulatory Visit: Payer: Medicare PPO | Admitting: Infectious Diseases

## 2023-08-01 VITALS — BP 159/87 | HR 76 | Temp 97.6°F

## 2023-08-01 DIAGNOSIS — L089 Local infection of the skin and subcutaneous tissue, unspecified: Secondary | ICD-10-CM | POA: Diagnosis not present

## 2023-08-01 DIAGNOSIS — M86171 Other acute osteomyelitis, right ankle and foot: Secondary | ICD-10-CM

## 2023-08-01 DIAGNOSIS — F1490 Cocaine use, unspecified, uncomplicated: Secondary | ICD-10-CM | POA: Insufficient documentation

## 2023-08-01 DIAGNOSIS — Z89431 Acquired absence of right foot: Secondary | ICD-10-CM

## 2023-08-01 DIAGNOSIS — Z79899 Other long term (current) drug therapy: Secondary | ICD-10-CM | POA: Insufficient documentation

## 2023-08-01 NOTE — Progress Notes (Signed)
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) 06/28/2023   Acute osteomyelitis of right foot (HCC) 06/22/2023   Pyogenic inflammation of bone (HCC) 06/21/2023   Diabetic foot infection (HCC) 06/20/2023   Folliculitis    Abscess of neck 06/27/2022   Hyponatremia with increased serum osmolality 06/27/2022   Inguinal abscess 12/12/2016   Diabetes mellitus with complication (HCC) 12/12/2016   Sepsis (HCC) 12/12/2016   Drug abuse (HCC)    Hyperglycemia    GERD (gastroesophageal reflux disease) 04/19/2016   Hypertension 04/19/2016   Anxiety 04/19/2016   Rupture of right quadriceps tendon 12/24/2014   Quadriceps tendon rupture 12/24/2014    Patient's Medications  New Prescriptions   No medications on file  Previous Medications   AMOXICILLIN-CLAVULANATE (AUGMENTIN) 875-125 MG TABLET    Take 1 tablet by mouth every 12 (twelve) hours.   ATORVASTATIN (LIPITOR) 40 MG TABLET    Take 1 tablet (40 mg total) by mouth at bedtime.   BIOFLAVONOID PRODUCTS (VITAMIN C) CHEW    Chew 2 tablets by mouth daily.   BLOOD GLUCOSE METER KIT AND SUPPLIES KIT    Dispense based on patient and insurance preference. Use up to four times daily as directed.   DOXYCYCLINE (VIBRA-TABS) 100 MG TABLET    Take 1 tablet (100 mg total) by mouth 2 (two) times daily.   DOXYCYCLINE (VIBRA-TABS) 100 MG TABLET    Take 1 tablet (100 mg total) by mouth 2 (two) times daily.   GLUCOSE BLOOD (CONTOUR NEXT TEST) TEST STRIP    Use as instructed   GLUCOSE BLOOD (TRUE METRIX BLOOD GLUCOSE TEST) TEST STRIP    Use as directed   INSULIN NPH-REGULAR HUMAN (70-30) 100 UNIT/ML INJECTION    Inject 30 Units into the skin 2 (two) times daily with a meal.   INSULIN SYRINGES, DISPOSABLE, U-100 0.5 ML MISC    Use as directed for insulin   LANCET DEVICES (MICROLET NEXT LANCING DEVICE) MISC    1 each by Does not apply route 3 (three) times daily.   LISINOPRIL (ZESTRIL)  30 MG TABLET    Take 30 mg by mouth every evening.   MICROLET LANCETS MISC    1 each by Subdermal route 3 (three) times daily.   MULTIPLE VITAMIN (MULTIVITAMIN) TABLET    Take 1 tablet by mouth daily.   OXYCODONE-ACETAMINOPHEN (PERCOCET) 5-325 MG TABLET    Take 1 tablet by mouth every 4 (four) hours as needed for severe pain.   OXYCODONE-ACETAMINOPHEN (PERCOCET) 5-325 MG TABLET    Take 1 tablet by mouth every 4 (four) hours as needed for severe pain.   TRAZODONE (DESYREL) 50 MG TABLET    Take 50 mg by mouth at bedtime.  Modified Medications   No medications on file  Discontinued Medications   No medications on file    Subjective: Jeffrey Murphy is a 67 y.o. male with history of posterior neck cellulitis status post I&D with cultures growing MSSA treated with hydroxyl x 2 weeks, diabetes mellitus, hypertension, right submetatarsal blister with underlying ulcer status post drainage of blister on 8/7 patient has been on doxycycline since that time presented with worsening wound.  On arrival to the ED vital stable.  X-ray showed no acute abnormality.  MRI showed severe bone marrow edema and third metatarsal head and base of the third proximal phalanx, septic arthritis, third MTP joint fluid collection concerning for abscess, marrow edema  at first metatarsal diaphysis.  Taken to the OR with podiatry for I&D of right foot, bone biopsy. Biopsy + for acute osteomyelitis. Cx positive for GPR and bacetroides fragilis.  Return to the OR on 8/18 for Lisfranc amputation right foot. Discharged on 8/22 to complete 6 weeks course of PO doxycycline augmentin.  08/01/23 Taking po doxycycline and augmentin as prescribed. Was last seen by Podiatry Dr Logan Bores 9/23 with improving findings in the rt foot and no concerns for ongoing infection. Sutures and staples were removed. He  reports next fu is in a month 10/23. Denies fevers, chills. Denies nausea, vomiting and diarrhea. Only complaint is shooting pain at the rt toes but  then says he does not have toes anymore and wondering where is pain coming from. Denies any other signs of infection at the rt foot surgical site like redness, tenderness, swelling. Reports h/o smoking cocaine a long time ago but clean for a while.   Review of Systems: all systems reviewed with pertinent positives and negatives as listed above  Past Medical History:  Diagnosis Date   Abscess 12/2016   LEFT INGUINAL ABSCESS   Arthritis    Diabetes mellitus without complication (HCC)    Hypertension    Wears glasses    reading   Past Surgical History:  Procedure Laterality Date   APPLICATION OF WOUND VAC Right 06/21/2023   Procedure: APPLICATION OF WOUND VAC;  Surgeon: Edwin Cap, DPM;  [facility];  Service: Orthopedics/Podiatry;  Laterality: Right;   COLONOSCOPY     CYST REMOVAL NECK     INCISION AND DRAINAGE ABSCESS N/A 07/02/2022   Procedure: INCISION AND DRAINAGE ABSCESS;  Surgeon: Suzanna Obey, MD;  Location: [facility] OR;  Service: ENT;  Laterality: N/A;   INGUINAL HERNIA REPAIR     right   IRRIGATION AND DEBRIDEMENT ABSCESS Left 12/12/2016   Procedure: IRRIGATION AND DEBRIDEMENT ABSCESS;  Surgeon: Almond Lint, MD;  [facility];  Service: General;  Laterality: Left;   IRRIGATION AND DEBRIDEMENT FOOT Right 06/21/2023   Procedure: IRRIGATION AND DEBRIDEMENT FOOT, CULTURES, BONE CULTURE AND PATHOLOGY;  Surgeon: Edwin Cap, DPM;  [facility];  Service: Orthopedics/Podiatry;  Laterality: Right;   JOINT REPLACEMENT     right total knee   PILONIDAL CYST EXCISION     back   QUADRICEPS TENDON REPAIR Right 12/24/2014   Procedure: RIGHT QUADRICEP TENDON REPAIR ;  Surgeon: Cheral Almas, MD;  Location: [facility];  Service: Orthopedics;  Laterality: Right;   REPLACEMENT TOTAL KNEE     TRANSMETATARSAL AMPUTATION Right 06/25/2023   Procedure: TRANSMETATARSAL AMPUTATION;  Surgeon: [name], DPM;  [facility];  Service: Orthopedics/Podiatry;   Laterality: Right;    Social History   Tobacco Use   Smoking status: Never   Smokeless tobacco: Never  Vaping Use   Vaping status: Never Used  Substance Use Topics   Alcohol use: Not Currently    Comment: drinks every other day   Drug use: Not Currently    Frequency: 5.0 times per week    Types: Marijuana, Cocaine, "Crack" cocaine    Comment: last use months    Family History  Problem Relation Age of Onset   Cancer Mother    Hypertension Mother    Diabetes Father     Allergies  Allergen Reactions   Crestor [Rosuvastatin] Other (See Comments)    Myalgias    Lipitor [Atorvastatin] Other (See Comments)    Myalgias  Health Maintenance  Topic Date Due   OPHTHALMOLOGY EXAM  Never done   Hepatitis C Screening  Never done   DTaP/Tdap/Td (1 - Tdap) Never done   Colonoscopy  Never done   Zoster Vaccines- Shingrix (1 of 2) Never done   Diabetic kidney evaluation - Urine ACR  06/23/2019   Medicare Annual Wellness (AWV)  06/30/2021   FOOT EXAM  03/31/2023   INFLUENZA VACCINE  06/08/2023   COVID-19 Vaccine (1 - 2023-24 season) Never done   HEMOGLOBIN A1C  12/22/2023   Diabetic kidney evaluation - eGFR measurement  06/25/2024   Pneumonia Vaccine 17+ Years old  Completed   HIV Screening  Completed   HPV VACCINES  Aged Out    Objective: BP (!) 162/80   Pulse 76   Temp 97.6 F (36.4 C) (Temporal)   SpO2 94%    Physical Exam Constitutional:      Appearance: Normal appearance.  HENT:     Head: Normocephalic and atraumatic.      Mouth: Mucous membranes are moist.  Eyes:    Conjunctiva/sclera: Conjunctivae normal.     Pupils: Pupils are equal, round, and bilaterally symmetrical   Cardiovascular:     Rate and Rhythm: Normal rate and regular rhythm.     Heart sounds: s1s2  Pulmonary:     Effort: Pulmonary effort is normal.     Breath sounds: Normal breath sounds.   Abdominal:     General: Non distended     Palpations: soft.   Musculoskeletal:         General: Normal range of motion. Rt TKA, rt foot surgical site has closed, no signs of dehiscence or infection.   Skin:    General: Skin is warm and dry.     Comments:  Neurological:     General: grossly non focal     Mental Status: awake, alert and oriented to person, place, and time.   Psychiatric:        Mood and Affect: Mood normal.   Lab Results Lab Results  Component Value Date   WBC 13.6 (H) 06/26/2023   HGB 13.1 06/26/2023   HCT 38.8 (L) 06/26/2023   MCV 82.6 06/26/2023   PLT 254 06/26/2023    Lab Results  Component Value Date   CREATININE 0.80 06/26/2023   BUN 19 06/26/2023   NA 133 (L) 06/26/2023   K 4.2 06/26/2023   CL 97 (L) 06/26/2023   CO2 27 06/26/2023    Lab Results  Component Value Date   ALT 10 06/21/2023   AST 15 06/21/2023   ALKPHOS 72 06/21/2023   BILITOT 1.1 06/21/2023    Lab Results  Component Value Date   CHOL 88 06/28/2022   HDL 19 (L) 06/28/2022   LDLCALC 46 06/28/2022   TRIG 115 06/28/2022   CHOLHDL 4.6 06/28/2022   Lab Results  Component Value Date   LABRPR Non Reactive 10/25/2019   No results found for: "HIV1RNAQUANT", "HIV1RNAVL", "CD4TABS"   Microbiology Results for orders placed or performed during the hospital encounter of 06/20/23  Culture, blood (routine x 2)     Status: None   Collection Time: 06/20/23  3:29 PM   Specimen: BLOOD  Result Value Ref Range Status   Specimen Description BLOOD SITE NOT SPECIFIED  Final   Special Requests   Final    BOTTLES DRAWN AEROBIC AND ANAEROBIC Blood Culture adequate volume   Culture   Final    NO GROWTH 5 DAYS Performed at [facility]  [facility], [address], [place], [place] [address]    Report Status 06/25/2023 FINAL  Final  Culture, blood (routine x 2)     Status: None   Collection Time: 06/20/23  4:46 PM   Specimen: BLOOD RIGHT HAND  Result Value Ref Range Status   Specimen Description BLOOD RIGHT HAND  Final   Special Requests   Final    BOTTLES DRAWN AEROBIC AND  ANAEROBIC Blood Culture adequate volume   Culture   Final    NO GROWTH 5 DAYS Performed at [facility], [address] [place], [place] [address]    Report Status 06/25/2023 FINAL  Final  MRSA Next Gen by PCR, Nasal     Status: None   Collection Time: 06/21/23  8:43 AM   Specimen: Nasal Mucosa; Nasal Swab  Result Value Ref Range Status   MRSA by PCR Next Gen NOT DETECTED NOT DETECTED Final    Comment: (NOTE) The GeneXpert MRSA Assay (FDA approved for NASAL specimens only), is one component of a comprehensive MRSA colonization surveillance program. It is not intended to diagnose MRSA infection nor to guide or monitor treatment for MRSA infections. Test performance is not FDA approved in patients less than 37 years old. Performed at [facility], [address] [place], [place] [address]   Aerobic/Anaerobic Culture w Gram Stain (surgical/deep wound)     Status: None   Collection Time: 06/21/23  8:49 PM   Specimen: Abscess  Result Value Ref Range Status   Specimen Description ABSCESS  Final   Special Requests RIGHT FOOT  Final   Gram Stain   Final    ABUNDANT WBC PRESENT, PREDOMINANTLY PMN FEW GRAM POSITIVE COCCI IN PAIRS FEW GRAM POSITIVE RODS    Culture   Final    ABUNDANT GRAM POSITIVE RODS UNABLE TO ID AT [facility] MODERATE BACTEROIDES FRAGILIS BETA LACTAMASE POSITIVE Performed at [facility], [address], [place], [place] [address]    Report Status 07/27/2023 FINAL  Final  Aerobic/Anaerobic Culture w Gram Stain (surgical/deep wound)     Status: None   Collection Time: 06/21/23  8:49 PM   Specimen: Bone; Tissue  Result Value Ref Range Status   Specimen Description BONE  Final   Special Requests NONE  Final   Gram Stain   Final    RARE WBC PRESENT, PREDOMINANTLY PMN FEW GRAM POSITIVE COCCI    Culture   Final    FEW GRAM POSITIVE RODS UNABLE TO ID AT [facility] CORRECTED ON 09/19 AT 1110: PREVIOUSLY REPORTED AS FEW BACTEROIDES FRAGILIS FEW  BACTEROIDES FRAGILIS BETA LACTAMASE POSITIVE Performed at [facility], [address], [place], [place] [address]    Report Status 07/27/2023 FINAL  Final   Imaging No results found.  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 - Right foot dorsum, MRI showed first and third toe osteo with underlying abscess at the third MTP joint and abscess deep to the flexor digitorum brevis muscle.  He had undergone debridement and bone biopsy on 8/14 with cultures growing GPR(for identification [facility]) Bacteroides fragilis beta-lactamase positive.  Pathology showed acute osteomyelitis. - Taken back to the OR on 8/18 with  podiatry for tendo Achilles lengthening and Lisfranc amputation right foot. _ Discharged 8/19 to complete 6 weeks course of PO augmentin and doxycycline.EOT 9/24  Plan  Complete course of po doxycyline and augmentin  GPR was unable to identified, verified with ID pharm D Sharin Mons Fu as needed with [place]  Fu with Podiatry as instructed   I have personally spent 43  minutes involved in face-to-face and non-face-to-face activities for this patient on the day of the visit. Professional time spent includes the following activities: Preparing to see the patient (review of tests), Obtaining and/or reviewing separately obtained history (admission/discharge record), Performing a medically appropriate examination and/or evaluation , Ordering medications/tests/procedures, referring and communicating with other health care professionals, Documenting clinical information in the EMR, Independently interpreting results (not separately reported),  Communicating results to the patient/family/caregiver, Counseling and educating the patient/family/caregiver and Care coordination (not separately reported).   Victoriano Lain, MD [facility] 08/01/2023, 3:09 PM

## 2023-08-02 LAB — COMPREHENSIVE METABOLIC PANEL
AG Ratio: 1.5 (calc) (ref 1.0–2.5)
ALT: 22 U/L (ref 9–46)
AST: 20 U/L (ref 10–35)
Albumin: 4.5 g/dL (ref 3.6–5.1)
Alkaline phosphatase (APISO): 59 U/L (ref 35–144)
BUN: 16 mg/dL (ref 7–25)
CO2: 30 mmol/L (ref 20–32)
Calcium: 9.7 mg/dL (ref 8.6–10.3)
Chloride: 100 mmol/L (ref 98–110)
Creat: 0.7 mg/dL (ref 0.70–1.35)
Globulin: 3 g/dL (calc) (ref 1.9–3.7)
Glucose, Bld: 72 mg/dL (ref 65–99)
Potassium: 4.3 mmol/L (ref 3.5–5.3)
Sodium: 137 mmol/L (ref 135–146)
Total Bilirubin: 0.8 mg/dL (ref 0.2–1.2)
Total Protein: 7.5 g/dL (ref 6.1–8.1)

## 2023-08-02 LAB — CBC
HCT: 40.6 % (ref 38.5–50.0)
Hemoglobin: 13.5 g/dL (ref 13.2–17.1)
MCH: 28.5 pg (ref 27.0–33.0)
MCHC: 33.3 g/dL (ref 32.0–36.0)
MCV: 85.8 fL (ref 80.0–100.0)
MPV: 9.9 fL (ref 7.5–12.5)
Platelets: 176 10*3/uL (ref 140–400)
RBC: 4.73 10*6/uL (ref 4.20–5.80)
RDW: 14.5 % (ref 11.0–15.0)
WBC: 5.4 10*3/uL (ref 3.8–10.8)

## 2023-08-15 ENCOUNTER — Other Ambulatory Visit (HOSPITAL_COMMUNITY): Payer: Self-pay

## 2023-08-30 ENCOUNTER — Ambulatory Visit (INDEPENDENT_AMBULATORY_CARE_PROVIDER_SITE_OTHER): Payer: Medicare HMO | Admitting: Podiatry

## 2023-08-30 ENCOUNTER — Encounter: Payer: Self-pay | Admitting: Podiatry

## 2023-08-30 DIAGNOSIS — Z89431 Acquired absence of right foot: Secondary | ICD-10-CM

## 2023-08-30 DIAGNOSIS — E0843 Diabetes mellitus due to underlying condition with diabetic autonomic (poly)neuropathy: Secondary | ICD-10-CM

## 2023-08-30 NOTE — Progress Notes (Signed)
   Chief Complaint  Patient presents with   Routine Post Op    POV #3 DOS: 06/25/2023 Procedure: Right transmetatarsal amputation  "Its all healed up. The nurse said it needs some lotion"    Subjective:  Patient presents today status post Lisfranc amputation right foot with tendo Achilles lengthening.  DOS: 06/25/2023.  Patient continues to do very well.  He has been wearing the cam boot.  No new complaints  Past Medical History:  Diagnosis Date   Abscess 12/2016   LEFT INGUINAL ABSCESS   Arthritis    Diabetes mellitus without complication (HCC)    Hypertension    Wears glasses    reading    Past Surgical History:  Procedure Laterality Date   APPLICATION OF WOUND VAC Right 06/21/2023   Procedure: APPLICATION OF WOUND VAC;  Surgeon: Edwin Cap, DPM;  [facility];  Service: Orthopedics/Podiatry;  Laterality: Right;   COLONOSCOPY     CYST REMOVAL NECK     INCISION AND DRAINAGE ABSCESS N/A 07/02/2022   Procedure: INCISION AND DRAINAGE ABSCESS;  Surgeon: Suzanna Obey, MD;  Location: [facility] OR;  Service: ENT;  Laterality: N/A;   INGUINAL HERNIA REPAIR     right   IRRIGATION AND DEBRIDEMENT ABSCESS Left 12/12/2016   Procedure: IRRIGATION AND DEBRIDEMENT ABSCESS;  Surgeon: Almond Lint, MD;  Location: [facility];  Service: General;  Laterality: Left;   IRRIGATION AND DEBRIDEMENT FOOT Right 06/21/2023   Procedure: IRRIGATION AND DEBRIDEMENT FOOT, CULTURES, BONE CULTURE AND PATHOLOGY;  Surgeon: Edwin Cap, DPM;  [facility];  Service: Orthopedics/Podiatry;  Laterality: Right;   JOINT REPLACEMENT     right total knee   PILONIDAL CYST EXCISION     back   QUADRICEPS TENDON REPAIR Right 12/24/2014   Procedure: RIGHT QUADRICEP TENDON REPAIR ;  Surgeon: Cheral Almas, MD;  Location: [facility];  Service: Orthopedics;  Laterality: Right;   REPLACEMENT TOTAL KNEE     TRANSMETATARSAL AMPUTATION Right 06/25/2023   Procedure: TRANSMETATARSAL AMPUTATION;  Surgeon: [name], DPM;  [facility];  Service: Orthopedics/Podiatry;  Laterality: Right;    Allergies  Allergen Reactions   Crestor [Rosuvastatin] Other (See Comments)    Myalgias    Lipitor [Atorvastatin] Other (See Comments)    Myalgias     Objective/Physical Exam Neurovascular status intact.  Incision has nicely healed.  No edema or erythema around the foot or ankle.  DG Foot Complete Right  06/25/2023:  IMPRESSION: 1. Interval amputation of the metatarsals and phalanges. Partial amputation of the distal half of medial cuneiform.  Assessment: 1. s/p Lisfranc amputation right foot. DOS: 06/25/2023   Plan of Care:  -Patient was evaluated.  - The amputation site has healed completely.  Complete reepithelialization has occurred.  No edema. -Appointment with Pedorthist for diabetic shoes with custom molded Plastizote insoles with a toe filler to the right foot.  Order placed -Return to clinic with me annually  [name], DPM [facility]  Dr. [name], DPM    [address] [place], [place] [address]                Office [phone]  Fax [phone]

## 2023-09-21 ENCOUNTER — Ambulatory Visit: Payer: Medicare HMO

## 2023-09-21 DIAGNOSIS — Z89431 Acquired absence of right foot: Secondary | ICD-10-CM

## 2023-09-21 DIAGNOSIS — E118 Type 2 diabetes mellitus with unspecified complications: Secondary | ICD-10-CM

## 2023-09-21 DIAGNOSIS — E0843 Diabetes mellitus due to underlying condition with diabetic autonomic (poly)neuropathy: Secondary | ICD-10-CM

## 2023-09-21 DIAGNOSIS — L97511 Non-pressure chronic ulcer of other part of right foot limited to breakdown of skin: Secondary | ICD-10-CM

## 2023-09-21 NOTE — Progress Notes (Signed)
Patient presents to the office today for diabetic shoe and insole measuring.  Patient was measured with brannock device to determine size and width for 1 pair of extra depth shoes and foam casted for 3 pair of insoles.   Documentation of medical necessity will be sent to patient's treating diabetic doctor to verify and sign.   Patient's diabetic provider: [name]   Shoes and insoles will be ordered at that time and patient will be notified for an appointment for fitting when they arrive.   Shoe size (per patient): 11 Brannock measurement: 9.5  Patient shoe selection- Shoe choice:   [id number] Shoe size ordered: 9.5WD

## 2023-11-22 ENCOUNTER — Ambulatory Visit (INDEPENDENT_AMBULATORY_CARE_PROVIDER_SITE_OTHER): Payer: Medicare HMO

## 2023-11-22 DIAGNOSIS — M2141 Flat foot [pes planus] (acquired), right foot: Secondary | ICD-10-CM | POA: Diagnosis not present

## 2023-11-22 DIAGNOSIS — E118 Type 2 diabetes mellitus with unspecified complications: Secondary | ICD-10-CM | POA: Diagnosis not present

## 2023-11-22 DIAGNOSIS — M2142 Flat foot [pes planus] (acquired), left foot: Secondary | ICD-10-CM | POA: Diagnosis not present

## 2023-11-22 DIAGNOSIS — L97511 Non-pressure chronic ulcer of other part of right foot limited to breakdown of skin: Secondary | ICD-10-CM

## 2023-11-22 DIAGNOSIS — L97512 Non-pressure chronic ulcer of other part of right foot with fat layer exposed: Secondary | ICD-10-CM | POA: Diagnosis not present

## 2023-11-22 DIAGNOSIS — Z89431 Acquired absence of right foot: Secondary | ICD-10-CM

## 2023-11-22 NOTE — Progress Notes (Signed)
 Patient presents today to pick up diabetic shoes and insoles.  Patient was dispensed 1ea To[place]iller pair of diabetic shoes and 3ea Left diabetic insoles. Fit was satisfactory. Instructions for break-in and wear was reviewed and a copy was given to the patient.   Re-appointment for regularly scheduled diabetic foot care visits or if they should experience any trouble with the shoes or insoles.  Britton Cane CPed, CFo, CFm

## 2024-03-06 ENCOUNTER — Ambulatory Visit (INDEPENDENT_AMBULATORY_CARE_PROVIDER_SITE_OTHER): Admitting: Podiatry

## 2024-03-06 ENCOUNTER — Ambulatory Visit (INDEPENDENT_AMBULATORY_CARE_PROVIDER_SITE_OTHER)

## 2024-03-06 ENCOUNTER — Encounter: Payer: Self-pay | Admitting: Podiatry

## 2024-03-06 DIAGNOSIS — B351 Tinea unguium: Secondary | ICD-10-CM | POA: Diagnosis not present

## 2024-03-06 DIAGNOSIS — M2042 Other hammer toe(s) (acquired), left foot: Secondary | ICD-10-CM | POA: Diagnosis not present

## 2024-03-06 DIAGNOSIS — M2012 Hallux valgus (acquired), left foot: Secondary | ICD-10-CM

## 2024-03-06 DIAGNOSIS — M79675 Pain in left toe(s): Secondary | ICD-10-CM

## 2024-03-06 NOTE — Progress Notes (Signed)
 Chief Complaint  Patient presents with   [facility]    RM#8 [facility] / concerns of hammer toe in left foot.    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  DOS: 06/25/2023.  Right foot healed.  Also presenting for left foot diabetic footcare  Past Medical History:  Diagnosis Date   Abscess 12/2016   LEFT INGUINAL ABSCESS   Arthritis    Diabetes mellitus without complication (HCC)    Hypertension    Wears glasses    reading    Past Surgical History:  Procedure Laterality Date   APPLICATION OF WOUND VAC Right 06/21/2023   Procedure: APPLICATION OF WOUND VAC;  Surgeon: Floyce Hutching, DPM;  Location: [facility] OR;  Service: Orthopedics/Podiatry;  Laterality: Right;   COLONOSCOPY     CYST REMOVAL NECK     INCISION AND DRAINAGE ABSCESS N/A 07/02/2022   Procedure: INCISION AND DRAINAGE ABSCESS;  Surgeon: Vernadine Golas, MD;  Location: [facility] OR;  Service: ENT;  Laterality: N/A;   INGUINAL HERNIA REPAIR     right   IRRIGATION AND DEBRIDEMENT ABSCESS Left 12/12/2016   Procedure: IRRIGATION AND DEBRIDEMENT ABSCESS;  Surgeon: Lockie Rima, MD;  Location: [facility] OR;  Service: General;  Laterality: Left;   IRRIGATION AND DEBRIDEMENT FOOT Right 06/21/2023   Procedure: IRRIGATION AND DEBRIDEMENT FOOT, CULTURES, BONE CULTURE AND PATHOLOGY;  Surgeon: Floyce Hutching, DPM;  Location: [facility];  Service: Orthopedics/Podiatry;  Laterality: Right;   JOINT REPLACEMENT     right total knee   PILONIDAL CYST EXCISION     back   QUADRICEPS TENDON REPAIR Right 12/24/2014   Procedure: RIGHT QUADRICEP TENDON REPAIR ;  Surgeon: Edison Gore, MD;  Location: [facility];  Service: Orthopedics;  Laterality: Right;   REPLACEMENT TOTAL KNEE     TRANSMETATARSAL AMPUTATION Right 06/25/2023   Procedure: TRANSMETATARSAL AMPUTATION;  Surgeon: Dot Gazella, DPM;  [facility];  Service: Orthopedics/Podiatry;  Laterality: Right;    Allergies  Allergen  Reactions   Crestor [Rosuvastatin] Other (See Comments)    Myalgias    Lipitor [Atorvastatin ] Other (See Comments)    Myalgias     Objective/Physical Exam Neurovascular status intact.  Incision has nicely healed.  No calluses or concerns to the right foot.  Hallux numbness noted left foot  Hyperkeratotic dystrophic nails noted left foot 1-5.  DG Foot Complete Right  06/25/2023:  IMPRESSION: 1. Interval amputation of the metatarsals and phalanges. Partial amputation of the distal half of medial cuneiform.  Assessment: 1. H/o Lisfranc amputation right foot. DOS: 06/25/2023 2.  Pain due to onychomycosis of toenails left foot 3.  Diabetes mellitus with peripheral polyneuropathy 4. Hallux valgus left foot   Plan of Care:  -Patient was evaluated.  Comprehensive diabetic foot exam performed today - Mechanical debridement of nails 1-5 left foot was performed using nail nipper without incident or bleeding -Continue diabetic shoes with insoles -In regards to the hallux valgus to the left foot recommend wide fitting shoes and supports that do not irritate or constrict the toebox area -Return to clinic as needed  Dot Gazella, DPM [facility]  Dr. Dot Gazella, DPM    [address], [place] [address]  Office [phone]  Fax [phone]

## 2024-04-15 ENCOUNTER — Other Ambulatory Visit

## 2024-04-16 ENCOUNTER — Other Ambulatory Visit: Payer: Self-pay | Admitting: Student

## 2024-04-16 ENCOUNTER — Ambulatory Visit
Admission: RE | Admit: 2024-04-16 | Discharge: 2024-04-16 | Disposition: A | Discharge status: Home / Self Care | Source: Ambulatory Visit | Attending: Student

## 2024-04-16 DIAGNOSIS — R058 Other specified cough: Secondary | ICD-10-CM

## 2024-04-30 ENCOUNTER — Ambulatory Visit

## 2024-04-30 NOTE — Progress Notes (Signed)
 Toe filler right side breaking down and over supination breaking down lateral edge of shoe patient feeling unstable  Lateral wedge added to insert to help bring patient back more neutral  Patient was happy with modification and will call office if any other problems arise  Lolita

## 2024-05-06 ENCOUNTER — Telehealth: Payer: Self-pay | Admitting: Podiatry

## 2024-05-06 NOTE — Telephone Encounter (Signed)
 LVM for pt letting him know that the paperwork pt dropped off at office is unable to be filled out by Dr. Janit. The paperwork is regarding permanent disability and will need to be completed by pt's PCP. Pts blank form can be picked up at front desk to take to his PCP.

## 2024-09-02 ENCOUNTER — Other Ambulatory Visit (HOSPITAL_COMMUNITY): Payer: Self-pay

## 2024-09-02 ENCOUNTER — Ambulatory Visit: Admitting: Podiatry

## 2024-09-02 ENCOUNTER — Encounter: Payer: Self-pay | Admitting: Podiatry

## 2024-09-02 VITALS — Ht 69.0 in | Wt 187.8 lb

## 2024-09-02 DIAGNOSIS — Z89431 Acquired absence of right foot: Secondary | ICD-10-CM

## 2024-09-02 NOTE — Progress Notes (Signed)
 Chief Complaint  Patient presents with   Callouses    Pt is here due to callous on the bottom of the right foot, has history of amputation to the foot, states he is a diabetic and concern about it, states he does not know when the spot started, states it hurts to walk on.    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  DOS: 06/25/2023.  Right foot healed.  Also presenting for left foot diabetic footcare  Past Medical History:  Diagnosis Date   Abscess 12/2016   LEFT INGUINAL ABSCESS   Arthritis    Diabetes mellitus without complication (HCC)    Hypertension    Wears glasses    reading    Past Surgical History:  Procedure Laterality Date   APPLICATION OF WOUND VAC Right 06/21/2023   Procedure: APPLICATION OF WOUND VAC;  Surgeon: Silva Juliene SAUNDERS, DPM;  [facility];  Service: Orthopedics/Podiatry;  Laterality: Right;   COLONOSCOPY     CYST REMOVAL NECK     INCISION AND DRAINAGE ABSCESS N/A 07/02/2022   Procedure: INCISION AND DRAINAGE ABSCESS;  Surgeon: Roark Rush, MD;  Location: [facility] OR;  Service: ENT;  Laterality: N/A;   INGUINAL HERNIA REPAIR     right   IRRIGATION AND DEBRIDEMENT ABSCESS Left 12/12/2016   Procedure: IRRIGATION AND DEBRIDEMENT ABSCESS;  Surgeon: Jina Nephew, MD;  [facility];  Service: General;  Laterality: Left;   IRRIGATION AND DEBRIDEMENT FOOT Right 06/21/2023   Procedure: IRRIGATION AND DEBRIDEMENT FOOT, CULTURES, BONE CULTURE AND PATHOLOGY;  Surgeon: Silva Juliene SAUNDERS, DPM;  Location: [facility];  Service: Orthopedics/Podiatry;  Laterality: Right;   JOINT REPLACEMENT     right total knee   PILONIDAL CYST EXCISION     back   QUADRICEPS TENDON REPAIR Right 12/24/2014   Procedure: RIGHT QUADRICEP TENDON REPAIR ;  Surgeon: Kay Ozell Cummins, MD;  Location: [facility];  Service: Orthopedics;  Laterality: Right;   REPLACEMENT TOTAL KNEE     TRANSMETATARSAL AMPUTATION Right  06/25/2023   Procedure: TRANSMETATARSAL AMPUTATION;  Surgeon: Janit Thresa HERO, DPM;  Location: [facility];  Service: Orthopedics/Podiatry;  Laterality: Right;    Allergies  Allergen Reactions   Crestor [Rosuvastatin] Other (See Comments)    Myalgias    Lipitor [Atorvastatin ] Other (See Comments)    Myalgias     RT foot 09/02/2024  Objective/Physical Exam Neurovascular status intact.  Incision has nicely healed.  Preulcerative callus noted to the plantar aspect of the right foot amputation stump.  Please see above noted photo.  There does not appear to be any open wound or drainage  Hyperkeratotic dystrophic nails noted left foot 1-5.  DG Foot Complete Right  06/25/2023:  IMPRESSION: 1. Interval amputation of the metatarsals and phalanges. Partial amputation of the distal half of medial cuneiform.  Assessment: 1. H/o Lisfranc amputation right foot. DOS: 06/25/2023 2.  Diabetes mellitus with peripheral polyneuropathy 4.  Preulcerative callus lesion plantar aspect of the right foot amputation stump   Plan of Care:  -Patient was evaluated.   - The preulcerative callus lesion developed due to walking around the house barefoot without socks or shoes.  Advised against this.  He says that he has been walking without shoes or socks around the house for the last 2-3 weeks and this is when the lesion developed. -Continue diabetic shoes with Plastizote insoles and toe filler -Return to clinic annually  Thresa EMERSON Janit, DPM [facility]  Dr. Thresa EMERSON Sar, DPM    [address] [place], [place] [address]                Office [phone]  Fax [phone]

## 2024-09-16 ENCOUNTER — Other Ambulatory Visit (HOSPITAL_COMMUNITY): Payer: Self-pay
